# Patient Record
Sex: Female | Born: 1950 | Race: White | Hispanic: No | Marital: Single | Smoking: Never smoker
Health system: Southern US, Community
[De-identification: ages and names within clinical notes are randomized; demographics above are authoritative.]

## PROBLEM LIST (undated history)

## (undated) DIAGNOSIS — E785 Hyperlipidemia, unspecified: Secondary | ICD-10-CM

## (undated) DIAGNOSIS — M545 Low back pain, unspecified: Secondary | ICD-10-CM

## (undated) DIAGNOSIS — M1990 Unspecified osteoarthritis, unspecified site: Secondary | ICD-10-CM

## (undated) DIAGNOSIS — F32A Depression, unspecified: Secondary | ICD-10-CM

## (undated) DIAGNOSIS — F329 Major depressive disorder, single episode, unspecified: Secondary | ICD-10-CM

## (undated) DIAGNOSIS — F0280 Dementia in other diseases classified elsewhere without behavioral disturbance: Secondary | ICD-10-CM

## (undated) DIAGNOSIS — E538 Deficiency of other specified B group vitamins: Secondary | ICD-10-CM

## (undated) HISTORY — DX: Major depressive disorder, single episode, unspecified: F32.9

## (undated) HISTORY — DX: Hyperlipidemia, unspecified: E78.5

## (undated) HISTORY — DX: Depression, unspecified: F32.A

## (undated) HISTORY — DX: Unspecified osteoarthritis, unspecified site: M19.90

## (undated) HISTORY — DX: Deficiency of other specified B group vitamins: E53.8

## (undated) HISTORY — DX: Low back pain, unspecified: M54.50

## (undated) HISTORY — DX: Low back pain: M54.5

---

## 1988-10-24 HISTORY — PX: CHOLECYSTECTOMY: SHX55

## 1999-03-03 ENCOUNTER — Other Ambulatory Visit: Admission: RE | Admit: 1999-03-03 | Discharge: 1999-03-03 | Payer: Self-pay | Admitting: Family Medicine

## 2000-03-14 ENCOUNTER — Other Ambulatory Visit: Admission: RE | Admit: 2000-03-14 | Discharge: 2000-03-14 | Payer: Self-pay | Admitting: Family Medicine

## 2001-05-04 ENCOUNTER — Other Ambulatory Visit: Admission: RE | Admit: 2001-05-04 | Discharge: 2001-05-04 | Payer: Self-pay | Admitting: Internal Medicine

## 2002-04-24 ENCOUNTER — Other Ambulatory Visit: Admission: RE | Admit: 2002-04-24 | Discharge: 2002-04-24 | Payer: Self-pay | Admitting: Internal Medicine

## 2003-12-22 ENCOUNTER — Other Ambulatory Visit: Admission: RE | Admit: 2003-12-22 | Discharge: 2003-12-22 | Payer: Self-pay | Admitting: Internal Medicine

## 2004-01-13 ENCOUNTER — Ambulatory Visit (HOSPITAL_COMMUNITY): Admission: RE | Admit: 2004-01-13 | Discharge: 2004-01-13 | Payer: Self-pay | Admitting: Internal Medicine

## 2005-03-29 ENCOUNTER — Ambulatory Visit: Payer: Self-pay | Admitting: Internal Medicine

## 2005-04-06 ENCOUNTER — Ambulatory Visit: Payer: Self-pay | Admitting: Internal Medicine

## 2005-04-06 ENCOUNTER — Other Ambulatory Visit: Admission: RE | Admit: 2005-04-06 | Discharge: 2005-04-06 | Payer: Self-pay | Admitting: Internal Medicine

## 2005-04-09 ENCOUNTER — Ambulatory Visit: Payer: Self-pay | Admitting: Internal Medicine

## 2005-05-05 ENCOUNTER — Ambulatory Visit: Payer: Self-pay | Admitting: Gastroenterology

## 2005-05-19 ENCOUNTER — Ambulatory Visit: Payer: Self-pay | Admitting: Gastroenterology

## 2005-06-07 ENCOUNTER — Ambulatory Visit: Payer: Self-pay | Admitting: Internal Medicine

## 2005-09-27 ENCOUNTER — Ambulatory Visit: Payer: Self-pay | Admitting: Internal Medicine

## 2005-10-07 ENCOUNTER — Ambulatory Visit: Payer: Self-pay | Admitting: Endocrinology

## 2006-09-20 ENCOUNTER — Ambulatory Visit: Payer: Self-pay | Admitting: Internal Medicine

## 2006-09-20 LAB — CONVERTED CEMR LAB
ALT: 15 units/L (ref 0–40)
AST: 17 units/L (ref 0–37)
Albumin: 3.4 g/dL — ABNORMAL LOW (ref 3.5–5.2)
Basophils Relative: 0.6 % (ref 0.0–1.0)
Crystals: NEGATIVE
Eosinophil percent: 1.1 % (ref 0.0–5.0)
GFR calc non Af Amer: 55 mL/min
Glomerular Filtration Rate, Af Am: 66 mL/min/{1.73_m2}
HCT: 40.1 % (ref 36.0–46.0)
MCHC: 34.2 g/dL (ref 30.0–36.0)
Neutro Abs: 5.1 10*3/uL (ref 1.4–7.7)
RBC: 4.19 M/uL (ref 3.87–5.11)
RDW: 12.1 % (ref 11.5–14.6)
Sodium: 137 meq/L (ref 135–145)
Specific Gravity, Urine: 1.02 (ref 1.000–1.03)
Total Bilirubin: 0.6 mg/dL (ref 0.3–1.2)
Triglyceride fasting, serum: 72 mg/dL (ref 0–149)
WBC: 7.7 10*3/uL (ref 4.5–10.5)

## 2006-09-26 ENCOUNTER — Encounter: Payer: Self-pay | Admitting: Internal Medicine

## 2006-09-26 ENCOUNTER — Ambulatory Visit: Payer: Self-pay | Admitting: Internal Medicine

## 2006-09-26 ENCOUNTER — Other Ambulatory Visit: Admission: RE | Admit: 2006-09-26 | Discharge: 2006-09-26 | Payer: Self-pay | Admitting: Internal Medicine

## 2007-07-14 ENCOUNTER — Encounter: Payer: Self-pay | Admitting: Internal Medicine

## 2007-07-14 DIAGNOSIS — F329 Major depressive disorder, single episode, unspecified: Secondary | ICD-10-CM | POA: Insufficient documentation

## 2007-07-14 DIAGNOSIS — M1990 Unspecified osteoarthritis, unspecified site: Secondary | ICD-10-CM | POA: Insufficient documentation

## 2007-08-10 ENCOUNTER — Encounter: Payer: Self-pay | Admitting: Endocrinology

## 2007-10-22 ENCOUNTER — Encounter: Payer: Self-pay | Admitting: Internal Medicine

## 2007-10-26 ENCOUNTER — Telehealth: Payer: Self-pay | Admitting: Internal Medicine

## 2007-11-08 ENCOUNTER — Ambulatory Visit: Payer: Self-pay | Admitting: Internal Medicine

## 2007-11-08 LAB — CONVERTED CEMR LAB
AST: 41 units/L — ABNORMAL HIGH (ref 0–37)
Albumin: 3.6 g/dL (ref 3.5–5.2)
Basophils Absolute: 0 10*3/uL (ref 0.0–0.1)
Bilirubin, Direct: 0.1 mg/dL (ref 0.0–0.3)
Cholesterol: 206 mg/dL (ref 0–200)
Direct LDL: 142.6 mg/dL
Eosinophils Absolute: 0.1 10*3/uL (ref 0.0–0.6)
Eosinophils Relative: 1.4 % (ref 0.0–5.0)
GFR calc non Af Amer: 69 mL/min
Glucose, Bld: 95 mg/dL (ref 70–99)
HCT: 37.4 % (ref 36.0–46.0)
Hemoglobin: 13.1 g/dL (ref 12.0–15.0)
Ketones, ur: NEGATIVE mg/dL
Lymphocytes Relative: 34.1 % (ref 12.0–46.0)
MCHC: 35 g/dL (ref 30.0–36.0)
MCV: 92.6 fL (ref 78.0–100.0)
Monocytes Absolute: 0.6 10*3/uL (ref 0.2–0.7)
Neutro Abs: 4.2 10*3/uL (ref 1.4–7.7)
Neutrophils Relative %: 56.1 % (ref 43.0–77.0)
Potassium: 4.1 meq/L (ref 3.5–5.1)
Sodium: 140 meq/L (ref 135–145)
TSH: 0.68 microintl units/mL (ref 0.35–5.50)
Total Bilirubin: 0.6 mg/dL (ref 0.3–1.2)
Total CHOL/HDL Ratio: 4.1
Urobilinogen, UA: 0.2 (ref 0.0–1.0)

## 2007-11-13 ENCOUNTER — Ambulatory Visit: Payer: Self-pay | Admitting: Internal Medicine

## 2007-11-13 DIAGNOSIS — E538 Deficiency of other specified B group vitamins: Secondary | ICD-10-CM | POA: Insufficient documentation

## 2007-11-13 DIAGNOSIS — M545 Low back pain: Secondary | ICD-10-CM

## 2007-11-13 DIAGNOSIS — R945 Abnormal results of liver function studies: Secondary | ICD-10-CM | POA: Insufficient documentation

## 2007-11-13 DIAGNOSIS — E785 Hyperlipidemia, unspecified: Secondary | ICD-10-CM

## 2007-11-13 HISTORY — DX: Abnormal results of liver function studies: R94.5

## 2007-11-19 ENCOUNTER — Telehealth: Payer: Self-pay | Admitting: Internal Medicine

## 2007-11-22 ENCOUNTER — Encounter: Payer: Self-pay | Admitting: Internal Medicine

## 2007-11-22 ENCOUNTER — Encounter: Admission: RE | Admit: 2007-11-22 | Discharge: 2007-12-19 | Payer: Self-pay | Admitting: Internal Medicine

## 2007-11-29 ENCOUNTER — Encounter: Admission: RE | Admit: 2007-11-29 | Discharge: 2007-11-29 | Payer: Self-pay | Admitting: Internal Medicine

## 2008-02-07 ENCOUNTER — Ambulatory Visit: Payer: Self-pay | Admitting: Internal Medicine

## 2008-02-07 LAB — CONVERTED CEMR LAB
Albumin: 3.6 g/dL (ref 3.5–5.2)
Alkaline Phosphatase: 67 units/L (ref 39–117)
BUN: 12 mg/dL (ref 6–23)
Calcium: 9.5 mg/dL (ref 8.4–10.5)
GFR calc Af Amer: 74 mL/min
GFR calc non Af Amer: 61 mL/min
Glucose, Bld: 90 mg/dL (ref 70–99)
Potassium: 4.9 meq/L (ref 3.5–5.1)
Sodium: 142 meq/L (ref 135–145)
Total Protein: 6.8 g/dL (ref 6.0–8.3)

## 2008-02-13 ENCOUNTER — Ambulatory Visit: Payer: Self-pay | Admitting: Internal Medicine

## 2008-02-13 DIAGNOSIS — L255 Unspecified contact dermatitis due to plants, except food: Secondary | ICD-10-CM

## 2008-02-13 HISTORY — DX: Unspecified contact dermatitis due to plants, except food: L25.5

## 2008-03-27 ENCOUNTER — Telehealth: Payer: Self-pay | Admitting: Internal Medicine

## 2008-09-12 ENCOUNTER — Ambulatory Visit: Payer: Self-pay | Admitting: Internal Medicine

## 2008-09-12 DIAGNOSIS — R030 Elevated blood-pressure reading, without diagnosis of hypertension: Secondary | ICD-10-CM

## 2008-09-26 ENCOUNTER — Ambulatory Visit: Payer: Self-pay | Admitting: Internal Medicine

## 2008-09-26 DIAGNOSIS — I10 Essential (primary) hypertension: Secondary | ICD-10-CM

## 2008-10-21 ENCOUNTER — Encounter: Payer: Self-pay | Admitting: Internal Medicine

## 2008-11-25 ENCOUNTER — Telehealth: Payer: Self-pay | Admitting: Internal Medicine

## 2009-01-28 ENCOUNTER — Ambulatory Visit: Payer: Self-pay | Admitting: Internal Medicine

## 2009-01-28 LAB — CONVERTED CEMR LAB
AST: 23 units/L (ref 0–37)
Basophils Absolute: 0 10*3/uL (ref 0.0–0.1)
Bilirubin, Direct: 0.2 mg/dL (ref 0.0–0.3)
CO2: 30 meq/L (ref 19–32)
Chloride: 105 meq/L (ref 96–112)
Cholesterol: 206 mg/dL — ABNORMAL HIGH (ref 0–200)
Eosinophils Relative: 1.2 % (ref 0.0–5.0)
HCT: 35.3 % — ABNORMAL LOW (ref 36.0–46.0)
HDL: 52.3 mg/dL (ref >39.00)
Lymphocytes Relative: 27.8 % (ref 12.0–46.0)
Lymphs Abs: 1.8 10*3/uL (ref 0.7–4.0)
Monocytes Relative: 7.1 % (ref 3.0–12.0)
Neutrophils Relative %: 63.3 % (ref 43.0–77.0)
Platelets: 259 10*3/uL (ref 150.0–400.0)
Potassium: 4.2 meq/L (ref 3.5–5.1)
RDW: 11.9 % (ref 11.5–14.6)
Specific Gravity, Urine: <=1.005 (ref 1.000–1.030)
TSH: 0.69 microintl units/mL (ref 0.35–5.50)
Total Bilirubin: 0.7 mg/dL (ref 0.3–1.2)
Total CHOL/HDL Ratio: 4
Triglycerides: 94 mg/dL (ref 0.0–149.0)
Urine Glucose: NEGATIVE mg/dL
Urobilinogen, UA: 0.2 (ref 0.0–1.0)
VLDL: 18.8 mg/dL (ref 0.0–40.0)
WBC: 6.6 10*3/uL (ref 4.5–10.5)
pH: 6.5 (ref 5.0–8.0)

## 2009-02-02 ENCOUNTER — Ambulatory Visit: Payer: Self-pay | Admitting: Internal Medicine

## 2009-02-02 DIAGNOSIS — R109 Unspecified abdominal pain: Secondary | ICD-10-CM

## 2009-04-02 ENCOUNTER — Telehealth (INDEPENDENT_AMBULATORY_CARE_PROVIDER_SITE_OTHER): Payer: Self-pay | Admitting: *Deleted

## 2009-04-06 ENCOUNTER — Encounter: Payer: Self-pay | Admitting: Internal Medicine

## 2009-05-06 ENCOUNTER — Telehealth: Payer: Self-pay | Admitting: Internal Medicine

## 2009-10-26 ENCOUNTER — Encounter: Payer: Self-pay | Admitting: Internal Medicine

## 2010-02-10 ENCOUNTER — Ambulatory Visit: Payer: Self-pay | Admitting: Internal Medicine

## 2010-02-10 LAB — CONVERTED CEMR LAB
Albumin: 3.8 g/dL (ref 3.5–5.2)
Basophils Absolute: 0 10*3/uL (ref 0.0–0.1)
Calcium: 9.2 mg/dL (ref 8.4–10.5)
Chloride: 106 meq/L (ref 96–112)
Cholesterol: 181 mg/dL (ref 0–200)
Creatinine, Ser: 0.9 mg/dL (ref 0.4–1.2)
Eosinophils Absolute: 0.1 10*3/uL (ref 0.0–0.7)
HCT: 37.1 % (ref 36.0–46.0)
HDL: 52.4 mg/dL (ref >39.00)
Hemoglobin: 12.4 g/dL (ref 12.0–15.0)
Ketones, ur: NEGATIVE mg/dL
Leukocytes, UA: NEGATIVE
Lymphs Abs: 2 10*3/uL (ref 0.7–4.0)
MCHC: 33.4 g/dL (ref 30.0–36.0)
Monocytes Absolute: 0.5 10*3/uL (ref 0.1–1.0)
Neutro Abs: 3.3 10*3/uL (ref 1.4–7.7)
RDW: 12.8 % (ref 11.5–14.6)
Sodium: 144 meq/L (ref 135–145)
Specific Gravity, Urine: 1.01 (ref 1.000–1.030)
TSH: 0.69 microintl units/mL (ref 0.35–5.50)
Triglycerides: 91 mg/dL (ref 0.0–149.0)
Urobilinogen, UA: 0.2 (ref 0.0–1.0)

## 2010-02-17 ENCOUNTER — Other Ambulatory Visit: Admission: RE | Admit: 2010-02-17 | Discharge: 2010-02-17 | Payer: Self-pay | Admitting: Internal Medicine

## 2010-02-17 ENCOUNTER — Ambulatory Visit: Payer: Self-pay | Admitting: Internal Medicine

## 2010-02-17 LAB — HM PAP SMEAR

## 2010-04-05 ENCOUNTER — Encounter (INDEPENDENT_AMBULATORY_CARE_PROVIDER_SITE_OTHER): Payer: Self-pay | Admitting: *Deleted

## 2010-08-30 ENCOUNTER — Telehealth: Payer: Self-pay | Admitting: Internal Medicine

## 2010-09-03 ENCOUNTER — Ambulatory Visit: Payer: Self-pay | Admitting: Internal Medicine

## 2010-09-03 DIAGNOSIS — R21 Rash and other nonspecific skin eruption: Secondary | ICD-10-CM | POA: Insufficient documentation

## 2010-09-03 LAB — CONVERTED CEMR LAB
CO2: 30 meq/L (ref 19–32)
Calcium: 9.6 mg/dL (ref 8.4–10.5)
Creatinine, Ser: 0.9 mg/dL (ref 0.4–1.2)
Vitamin B-12: 673 pg/mL (ref 211–911)

## 2010-11-11 ENCOUNTER — Encounter: Payer: Self-pay | Admitting: Internal Medicine

## 2010-11-23 NOTE — Assessment & Plan Note (Signed)
Summary: 6 month follow up-lb   Vital Signs:  Patient profile:   60 year old female Height:      63 inches Weight:      169 pounds BMI:     30.05 Temp:     97.8 degrees F oral Pulse rate:   76 / minute Pulse rhythm:   regular Resp:     16 per minute BP sitting:   120 / 68  (left arm) Cuff size:   regular  Vitals Entered By: Lanier Prude, CMA(AAMA) (September 03, 2010 8:09 AM) CC: 6 mo f/u  c/o knot on Rt ankle Is Patient Diabetic? No   CC:  6 mo f/u  c/o knot on Rt ankle.  History of Present Illness: The patient presents for a follow up of hypertension, depression, OA, B 12 def  Current Medications (verified): 1)  Celexa 20 Mg  Tabs (Citalopram Hydrobromide) .... Once Daily 2)  Caltrate 600+d 600-400 Mg-Unit  Tabs (Calcium Carbonate-Vitamin D) .... Once Daily 3)  Lasix 20 Mg  Tabs (Furosemide) .Marland Kitchen.. 1 Once Daily Prn 4)  Klor-Con M10 10 Meq  Tbcr (Potassium Chloride Crys Cr) .Marland Kitchen.. 1 Po As Needed 5)  Cobal-1000 1000 Mcg/ml Inj Soln (Cyanocobalamin) .... Q 2 Wks 6)  Adult Aspirin Low Strength 81 Mg  Tbdp (Aspirin) .... Once Daily 7)  Fish Oil   Oil (Fish Oil) .... Once Daily 8)  Meloxicam 15 Mg Tabs (Meloxicam) .... Once Daily 9)  Zantac Otc .... By Mouth Prn  Allergies (verified): 1)  ! * Mycins  Past History:  Past Medical History: Last updated: 02/02/2009 Depression Osteoarthritis VITAMIN B12 DEFICENCY FAMILY HX:   DM--MOTHER                       CAD-- FATHER Low back pain Hyperlipidemia, mild GYN Dr Cherly Hensen  Social History: Last updated: 09/12/2008 Occupation: [employer] Single Never Smoked Lookking after her parents Alcohol use-no  Review of Systems  The patient denies fever, chest pain, dyspnea on exertion, and melena.    Physical Exam  General:  alert, well-developed, well-nourished, well-hydrated, appropriate dress, normal appearance, and overweight-appearing.   Head:  Normocephalic and atraumatic without obvious abnormalities. No  apparent alopecia or balding. Mouth:  Oral mucosa and oropharynx without lesions or exudates.  Teeth in good repair. Neck:  No deformities, masses, or tenderness noted. Lungs:  Normal respiratory effort, chest expands symmetrically. Lungs are clear to auscultation, no crackles or wheezes. Heart:  Normal rate and regular rhythm. S1 and S2 normal without gallop, murmur, click, rub or other extra sounds. Abdomen:  Bowel sounds positive,abdomen soft and non-tender without masses, organomegaly or hernias noted. Msk:  normal ROM.   Extremities:  No clubbing, cyanosis, edema, or deformity noted with normal full range of motion of all joints.   Neurologic:  No cranial nerve deficits noted. Station and gait are normal. Plantar reflexes are down-going bilaterally. DTRs are symmetrical throughout. Sensory, motor and coordinative functions appear intact. Skin:  R ankle 5 mm subq nodule NT Ringworm like rash on L hand Psych:  Cognition and judgment appear intact. Alert and cooperative with normal attention span and concentration. No apparent delusions, illusions, hallucinations   Impression & Recommendations:  Problem # 1:  ESSENTIAL HYPERTENSION (ICD-401.9) Assessment Unchanged  Her updated medication list for this problem includes:    Lasix 20 Mg Tabs (Furosemide) .Marland Kitchen... 1 once daily prn  Orders: TLB-B12, Serum-Total ONLY (52841-L24) TLB-BMP (Basic Metabolic Panel-BMET) (80048-METABOL) TLB-TSH (  Thyroid Stimulating Hormone) (84443-TSH)  Problem # 2:  B12 DEFICIENCY (ICD-266.2) Assessment: Unchanged  On the regimen of medicine(s) reflected in the chart    Orders: TLB-B12, Serum-Total ONLY ([address]-U04) TLB-BMP (Basic Metabolic Panel-BMET) (80048-METABOL) TLB-TSH (Thyroid Stimulating Hormone) (84443-TSH)  Problem # 3:  DEPRESSION (ICD-311) Assessment: Unchanged  Her updated medication list for this problem includes:    Celexa 20 Mg Tabs (Citalopram hydrobromide) ..... Once daily  Problem #  4:  SKIN RASH (ICD-782.1) L hand Assessment: Deteriorated  S/p Derm eval. Will reschedule w/another Derm per pt's request  Orders: Dermatology Referral (Derma) TLB-B12, Serum-Total ONLY (54098-J19) TLB-BMP (Basic Metabolic Panel-BMET) (80048-METABOL) TLB-TSH (Thyroid Stimulating Hormone) (84443-TSH)  Problem # 5:  HYPERLIPIDEMIA (ICD-272.4) Assessment: Unchanged  Complete Medication List: 1)  Celexa 20 Mg Tabs (Citalopram hydrobromide) .... Once daily 2)  Caltrate 600+d 600-400 Mg-unit Tabs (Calcium carbonate-vitamin d) .... Once daily 3)  Lasix 20 Mg Tabs (Furosemide) .Marland Kitchen.. 1 once daily prn 4)  Klor-con M10 10 Meq Tbcr (Potassium chloride crys cr) .Marland Kitchen.. 1 po as needed 5)  Cobal-1000 1000 Mcg/ml Inj Soln (Cyanocobalamin) .... Q 2 wks 6)  Adult Aspirin Low Strength 81 Mg Tbdp (Aspirin) .... Once daily 7)  Fish Oil Oil (Fish oil) .... Once daily 8)  Meloxicam 15 Mg Tabs (Meloxicam) .... Once daily 9)  Zantac Otc  .... By mouth prn  Patient Instructions: 1)  Please schedule a follow-up appointment in 6 months well w/labs.   Orders Added: 1)  Dermatology Referral [Derma] 2)  TLB-B12, Serum-Total ONLY [82607-B12] 3)  TLB-BMP (Basic Metabolic Panel-BMET) [80048-METABOL] 4)  TLB-TSH (Thyroid Stimulating Hormone) [84443-TSH] 5)  Est. Patient Level IV [14782]   Immunization History:  Influenza Immunization History:    Influenza:  historical (08/10/2010)   Immunization History:  Influenza Immunization History:    Influenza:  Historical (08/10/2010)

## 2010-11-23 NOTE — Progress Notes (Signed)
Summary: patient wanted to know if she had labs scheduled.  Phone Note Call from Patient Call back at Home Phone [phone] Call back at Work Phone [phone]   Caller: Patient Call For: Dr Posey Rea Summary of Call: Pt requesting labs before scheduling 6 mos f/u. No labs ordered. Please advise. Initial call taken by: Verdell Face,  August 30, 2010 8:12 AM  Follow-up for Phone Call        I explained to patient and she is aware no labs were ordered by MD. Follow-up by: Daphane Shepherd,  August 30, 2010 8:22 AM

## 2010-11-23 NOTE — Letter (Signed)
Summary: Colonoscopy Date Change Letter  [facility]  [address] [place], [place] [address]   Phone: [phone]  Fax: [phone]      April 05, 2010 MRN: [id number]   [facility] [address] [place], [place]  [address]   Dear [name],   Previously you were recommended to have a repeat colonoscopy around this time. Your chart was recently reviewed by Dr. Lina Sar of [facility]. Follow up colonoscopy is now recommended in July 2013. This revised recommendation is based on current, nationally recognized guidelines for colorectal cancer screening and polyp surveillance. These guidelines are endorsed by the American Cancer Society, [employer] on Colorectal Cancer as well as numerous other major medical organizations.  Please understand that our recommendation assumes that you do not have any new symptoms such as bleeding, a change in bowel habits, anemia, or significant abdominal discomfort. If you do have any concerning GI symptoms or want to discuss the guideline recommendations, please call to arrange an office visit at your earliest convenience. Otherwise we will keep you in our reminder system and contact you 1-2 months prior to the date listed above to schedule your next colonoscopy.  Thank you,  [name]. Juanda Chance, M.D.  [facility] [phone]

## 2010-11-23 NOTE — Assessment & Plan Note (Signed)
Summary: CPX/BCBS/#/CD   Vital Signs:  Patient profile:   60 year old female Height:      63 inches Weight:      173.38 pounds BMI:     30.82 O2 Sat:      99 % on Room air Temp:     97.5 degrees F oral Pulse rate:   84 / minute BP sitting:   132 / 76  (left arm) Cuff size:   regular  Vitals Entered By: Lucious Groves (February 17, 2010 10:33 AM)  O2 Flow:  Room air CC: CPX./kb Is Patient Diabetic? No Pain Assessment Patient in pain? no        CC:  CPX./kb.  History of Present Illness: The patient presents for a wellness examination   Current Medications (verified): 1)  Celexa 20 Mg  Tabs (Citalopram Hydrobromide) .... Once Daily 2)  Caltrate 600+d 600-400 Mg-Unit  Tabs (Calcium Carbonate-Vitamin D) .... Once Daily 3)  Lasix 20 Mg  Tabs (Furosemide) .Marland Kitchen.. 1 Once Daily Prn 4)  Klor-Con M10 10 Meq  Tbcr (Potassium Chloride Crys Cr) .Marland Kitchen.. 1 Po As Needed 5)  Cobal-1000 1000 Mcg/ml Inj Soln (Cyanocobalamin) .... Q 2 Wks 6)  Adult Aspirin Low Strength 81 Mg  Tbdp (Aspirin) .... Once Daily 7)  Fish Oil   Oil (Fish Oil) .... Once Daily 8)  Meloxicam 15 Mg Tabs (Meloxicam) .... Once Daily 9)  Zantac Otc .... By Mouth Prn  Allergies (verified): 1)  ! * Mycins  Past History:  Past Medical History: Last updated: 02/02/2009 Depression Osteoarthritis VITAMIN B12 DEFICENCY FAMILY HX:   DM--MOTHER                       CAD-- FATHER Low back pain Hyperlipidemia, mild GYN Dr Cherly Hensen  Social History: Last updated: 09/12/2008 Occupation: [employer] Single Never Smoked Lookking after her parents Alcohol use-no  Review of Systems       The patient complains of suspicious skin lesions.  The patient denies anorexia, fever, weight loss, weight gain, vision loss, decreased hearing, hoarseness, chest pain, syncope, dyspnea on exertion, peripheral edema, prolonged cough, headaches, hemoptysis, abdominal pain, melena, hematochezia, severe indigestion/heartburn, hematuria,  incontinence, genital sores, muscle weakness, transient blindness, difficulty walking, depression, unusual weight change, abnormal bleeding, enlarged lymph nodes, angioedema, and breast masses.    Physical Exam  General:  alert, well-developed, well-nourished, well-hydrated, appropriate dress, normal appearance, and overweight-appearing.   Head:  Normocephalic and atraumatic without obvious abnormalities. No apparent alopecia or balding. Eyes:  No corneal or conjunctival inflammation noted. EOMI. Perrla. Ears:  External ear exam shows no significant lesions or deformities.  Otoscopic examination reveals clear canals, tympanic membranes are intact bilaterally without bulging, retraction, inflammation or discharge. Hearing is grossly normal bilaterally. Nose:  External nasal examination shows no deformity or inflammation. Nasal mucosa are pink and moist without lesions or exudates. Mouth:  Oral mucosa and oropharynx without lesions or exudates.  Teeth in good repair. Neck:  No deformities, masses, or tenderness noted. Chest Wall:  No deformities, masses, LUQ rib tenderness noted. Breasts:  No mass, nodules, thickening, tenderness, bulging, retraction, inflamation, nipple discharge or skin changes noted.   Lungs:  Normal respiratory effort, chest expands symmetrically. Lungs are clear to auscultation, no crackles or wheezes. Heart:  Normal rate and regular rhythm. S1 and S2 normal without gallop, murmur, click, rub or other extra sounds. Abdomen:  Bowel sounds positive,abdomen soft and non-tender without masses, organomegaly or hernias noted. Rectal:  no external abnormalities, normal sphincter tone, and no masses.  G(-) stool Genitalia:  normal introitus, no external lesions, no vaginal discharge, no friaility or hemorrhage, no adnexal masses or tenderness, and vaginal atrophy.   Msk:  normal ROM.   Pulses:  R and L carotid,radial,femoral,dorsalis pedis and posterior tibial pulses are full and equal  bilaterally Extremities:  No clubbing, cyanosis, edema, or deformity noted with normal full range of motion of all joints.   Neurologic:  No cranial nerve deficits noted. Station and gait are normal. Plantar reflexes are down-going bilaterally. DTRs are symmetrical throughout. Sensory, motor and coordinative functions appear intact. Skin:  Intact without suspicious lesions or rashes SKs on back Cervical Nodes:  No lymphadenopathy noted Inguinal Nodes:  No significant adenopathy Psych:  Cognition and judgment appear intact. Alert and cooperative with normal attention span and concentration. No apparent delusions, illusions, hallucinations   Impression & Recommendations:  Problem # 1:  PHYSICAL EXAMINATION (ICD-V70.0) Assessment New Colonoscopy  this fall is pending  Orders: EKG w/ Interpretation (93000) OK Pap Smear, Thin Prep ( Collection of) (Z6109)  Problem # 2:  LUQ pain - costochondritis Assessment: Improved  Problem # 3:  B12 DEFICIENCY (ICD-266.2) Assessment: Unchanged On prescription/OTC  therapy   Problem # 4:  DEPRESSION (ICD-311) Assessment: Improved  Her updated medication list for this problem includes:    Celexa 20 Mg Tabs (Citalopram hydrobromide) ..... Once daily  Complete Medication List: 1)  Celexa 20 Mg Tabs (Citalopram hydrobromide) .... Once daily 2)  Caltrate 600+d 600-400 Mg-unit Tabs (Calcium carbonate-vitamin d) .... Once daily 3)  Lasix 20 Mg Tabs (Furosemide) .Marland Kitchen.. 1 once daily prn 4)  Klor-con M10 10 Meq Tbcr (Potassium chloride crys cr) .Marland Kitchen.. 1 po as needed 5)  Cobal-1000 1000 Mcg/ml Inj Soln (Cyanocobalamin) .... Q 2 wks 6)  Adult Aspirin Low Strength 81 Mg Tbdp (Aspirin) .... Once daily 7)  Fish Oil Oil (Fish oil) .... Once daily 8)  Meloxicam 15 Mg Tabs (Meloxicam) .... Once daily 9)  Zantac Otc  .... By mouth prn  Other Orders: Tdap => 60yrs IM (60454) Admin 1st Vaccine (09811) Admin 1st Vaccine [facility]) [phone])   Patient Instructions: 1)   Please schedule a follow-up appointment in 6 months. Prescriptions: CELEXA 20 MG  TABS (CITALOPRAM HYDROBROMIDE) once daily  #30 x 6   Entered and Authorized by:   Tresa Garter MD   Signed by:   Tresa Garter MD on 02/17/2010   Method used:   Electronically to        [facility]  [address] [id number] (retail)       [address]       [place], [place]  [address]       Ph: [phone] or [phone]       Fax: [phone]   RxID:   [id number] LASIX 20 MG  TABS (FUROSEMIDE) 1 once daily prn  #90 x 3   Entered and Authorized by:   Tresa Garter MD   Signed by:   Tresa Garter MD on 02/17/2010   Method used:   Electronically to        [facility]  [address] [id number] ([facility]       [address]       [place], [place]  [address]       Ph: [phone] or [phone]       Fax: [phone]  RxID:   [id number] KLOR-CON M10 10 MEQ  TBCR (POTASSIUM CHLORIDE CRYS CR) 1 po as needed  #90 x 3   Entered and Authorized by:   Tresa Garter MD   Signed by:   Tresa Garter MD on 02/17/2010   Method used:   Electronically to        [facility]  [address] [id number]       [address]       [place], [place]  [address]       Ph: [phone] or [phone]       Fax: [phone]   RxID:   [id number] MELOXICAM 15 MG TABS (MELOXICAM) once daily  #30 Tablet x 6   Entered and Authorized by:   Tresa Garter MD   Signed by:   Tresa Garter MD on 02/17/2010   Method used:   [facility]ronically to        [facility]  [address] [id number]       [address]       [place], [place]  [address]       Ph: [phone] or [phone]       Fax: [phone]   RxID:   [id number]    Tetanus/Td Vaccine    Vaccine Type: Tdap    Site: right deltoid    Mfr: GlaxoSmithKline    Dose: 0.5 ml    Route: IM    Given by: Lucious Groves    Exp. Date: 01/16/2012    Lot #: [id number]     VIS given: 09/11/07 version given February 17, 2010.

## 2010-11-29 ENCOUNTER — Ambulatory Visit: Payer: Self-pay | Admitting: Internal Medicine

## 2010-12-24 ENCOUNTER — Encounter: Payer: Self-pay | Admitting: Internal Medicine

## 2010-12-30 NOTE — Miscellaneous (Signed)
Summary: mammogram 2012   Clinical Lists Changes  Observations: Added new observation of MAMMOGRAM: normal (11/11/2010 16:37)      Preventive Care Screening  Mammogram:    Date:  11/11/2010    Results:  normal

## 2011-01-17 ENCOUNTER — Telehealth: Payer: Self-pay | Admitting: Internal Medicine

## 2011-01-17 NOTE — Telephone Encounter (Signed)
Use over-the-counter  "cold" medicines  such as "Tylenol cold" , "Advil cold",  "Mucinex" or" Mucinex D"  for cough and congestion.   Avoid decongestants if you have high blood pressure and use "Afrin" nasal spray for nasal congestion as directed instead. Use" Delsym" or" Robitussin" cough syrup varietis for cough.  You can use plain "Tylenol" or "Advil" for fever, chills and achyness.  

## 2011-01-17 NOTE — Telephone Encounter (Signed)
Pt states that she has a cough "w/stuffiness" x3 days w/o fever or mucus; no sinus pain and/or pressure and "feels like a pulled muscle in her back maybe" which has been a dull pain x2 weeks that she is noticing more prominent w/cough. Pt has a CPE appt for 02/18/2011 and would like to know if she could get a Rx called in or suggestion for something to take OTC for cough w/"stuffiness" and/or back pain.? Please advise.

## 2011-01-18 NOTE — Telephone Encounter (Signed)
LMOM w/information to inform Pt.

## 2011-02-11 ENCOUNTER — Telehealth: Payer: Self-pay | Admitting: *Deleted

## 2011-02-11 DIAGNOSIS — R5383 Other fatigue: Secondary | ICD-10-CM

## 2011-02-11 DIAGNOSIS — E538 Deficiency of other specified B group vitamins: Secondary | ICD-10-CM

## 2011-02-11 DIAGNOSIS — E785 Hyperlipidemia, unspecified: Secondary | ICD-10-CM

## 2011-02-11 DIAGNOSIS — R945 Abnormal results of liver function studies: Secondary | ICD-10-CM

## 2011-02-11 DIAGNOSIS — R7309 Other abnormal glucose: Secondary | ICD-10-CM

## 2011-02-11 DIAGNOSIS — I10 Essential (primary) hypertension: Secondary | ICD-10-CM

## 2011-02-11 NOTE — Telephone Encounter (Signed)
Patient requesting labs prior to next ov - 02/28/11. Please advise.

## 2011-02-12 NOTE — Telephone Encounter (Signed)
OK lipids, CMET, TSH, UA, CBC  790.29 Vit B12 266.20 Thx

## 2011-02-14 NOTE — Telephone Encounter (Signed)
Left vm for pt that labs were ordered and to be fasting.

## 2011-02-23 ENCOUNTER — Other Ambulatory Visit (INDEPENDENT_AMBULATORY_CARE_PROVIDER_SITE_OTHER): Payer: PRIVATE HEALTH INSURANCE

## 2011-02-23 DIAGNOSIS — E538 Deficiency of other specified B group vitamins: Secondary | ICD-10-CM

## 2011-02-23 DIAGNOSIS — R945 Abnormal results of liver function studies: Secondary | ICD-10-CM

## 2011-02-23 DIAGNOSIS — R5381 Other malaise: Secondary | ICD-10-CM

## 2011-02-23 DIAGNOSIS — R5383 Other fatigue: Secondary | ICD-10-CM

## 2011-02-23 DIAGNOSIS — E785 Hyperlipidemia, unspecified: Secondary | ICD-10-CM

## 2011-02-23 DIAGNOSIS — I10 Essential (primary) hypertension: Secondary | ICD-10-CM

## 2011-02-23 DIAGNOSIS — R7309 Other abnormal glucose: Secondary | ICD-10-CM

## 2011-02-23 LAB — LIPID PANEL
Cholesterol: 192 mg/dL (ref 0–200)
Total CHOL/HDL Ratio: 4
Triglycerides: 72 mg/dL (ref 0.0–149.0)

## 2011-02-23 LAB — URINALYSIS, ROUTINE W REFLEX MICROSCOPIC
Ketones, ur: NEGATIVE
Specific Gravity, Urine: 1.01 (ref 1.000–1.030)
Urine Glucose: NEGATIVE
pH: 6 (ref 5.0–8.0)

## 2011-02-23 LAB — CBC WITH DIFFERENTIAL/PLATELET
Basophils Absolute: 0 10*3/uL (ref 0.0–0.1)
Eosinophils Absolute: 0.1 10*3/uL (ref 0.0–0.7)
Eosinophils Relative: 1.3 % (ref 0.0–5.0)
MCHC: 33.4 g/dL (ref 30.0–36.0)
MCV: 93.5 fl (ref 78.0–100.0)
Monocytes Absolute: 0.5 10*3/uL (ref 0.1–1.0)
Neutrophils Relative %: 60.3 % (ref 43.0–77.0)
Platelets: 269 10*3/uL (ref 150.0–400.0)
WBC: 6.4 10*3/uL (ref 4.5–10.5)

## 2011-02-23 LAB — COMPREHENSIVE METABOLIC PANEL
ALT: 12 U/L (ref 0–35)
AST: 19 U/L (ref 0–37)
Albumin: 3.8 g/dL (ref 3.5–5.2)
Alkaline Phosphatase: 65 U/L (ref 39–117)
Potassium: 4.7 mEq/L (ref 3.5–5.1)
Sodium: 138 mEq/L (ref 135–145)
Total Protein: 7.1 g/dL (ref 6.0–8.3)

## 2011-02-28 ENCOUNTER — Encounter: Payer: Self-pay | Admitting: Internal Medicine

## 2011-02-28 ENCOUNTER — Ambulatory Visit (INDEPENDENT_AMBULATORY_CARE_PROVIDER_SITE_OTHER): Payer: BC Managed Care – PPO | Admitting: Internal Medicine

## 2011-02-28 VITALS — BP 126/70 | HR 80 | Temp 98.6°F | Resp 16 | Ht 63.0 in | Wt 162.0 lb

## 2011-02-28 DIAGNOSIS — Z136 Encounter for screening for cardiovascular disorders: Secondary | ICD-10-CM

## 2011-02-28 DIAGNOSIS — K1120 Sialoadenitis, unspecified: Secondary | ICD-10-CM

## 2011-02-28 DIAGNOSIS — Z0000 Encounter for general adult medical examination without abnormal findings: Secondary | ICD-10-CM

## 2011-02-28 DIAGNOSIS — K1121 Acute sialoadenitis: Secondary | ICD-10-CM

## 2011-02-28 DIAGNOSIS — F4321 Adjustment disorder with depressed mood: Secondary | ICD-10-CM

## 2011-02-28 HISTORY — DX: Acute sialoadenitis: K11.21

## 2011-02-28 MED ORDER — CEPHALEXIN 500 MG PO CAPS: 500.0000 mg | ORAL_CAPSULE | Freq: Four times a day (QID) | ORAL | Status: AC

## 2011-02-28 MED ORDER — ALPRAZOLAM 0.25 MG PO TABS: 0.2500 mg | ORAL_TABLET | Freq: Two times a day (BID) | ORAL | Status: DC

## 2011-02-28 NOTE — Assessment & Plan Note (Signed)
We discussed age appropriate health related issues, including available/recomended screening tests and vaccinations. We discussed a need for adhering to healthy diet and exercise. Labs/EKG were reviewed/ordered. EKG changes are c/w artifact. No CV sx's.  All questions were answered.

## 2011-02-28 NOTE — Progress Notes (Signed)
  Subjective:    Patient ID: [name], female    DOB: Apr 15, 1951, 60 y.o.   MRN: [id number]  HPI  The patient is here for a wellness exam. The patient has been doing well overall . She has been grieving   She has lost wt.  C/o swelling in glands   Review of Systems  Constitutional: Positive for unexpected weight change. Negative for fever, chills, diaphoresis, activity change, appetite change and fatigue.  HENT: Negative for hearing loss, ear pain, nosebleeds, congestion, sore throat, facial swelling, rhinorrhea, sneezing, mouth sores, trouble swallowing, neck pain, neck stiffness, postnasal drip, sinus pressure and tinnitus.   Eyes: Negative for pain, discharge, redness, itching and visual disturbance.  Respiratory: Negative for cough, chest tightness, shortness of breath, wheezing and stridor.   Cardiovascular: Negative for chest pain, palpitations and leg swelling.  Gastrointestinal: Negative for nausea, diarrhea, constipation, blood in stool, abdominal distention, anal bleeding and rectal pain.  Genitourinary: Negative for dysuria, urgency, frequency, hematuria, flank pain, vaginal bleeding, vaginal discharge, difficulty urinating, genital sores and pelvic pain.  Musculoskeletal: Negative for back pain, joint swelling, arthralgias and gait problem.  Skin: Negative.  Negative for rash.  Neurological: Negative for dizziness, tremors, seizures, syncope, speech difficulty, weakness, numbness and headaches.  Hematological: Negative for adenopathy. Does not bruise/bleed easily.  Psychiatric/Behavioral: Positive for behavioral problems, sleep disturbance, dysphoric mood and decreased concentration. Negative for suicidal ideas. The patient is nervous/anxious.    Wt Readings from Last 3 Encounters:  02/28/11 162 lb (73.483 kg)  09/03/10 169 lb (76.658 kg)  02/17/10 173 lb 6.1 oz (78.645 kg)       Objective:   Physical Exam  Constitutional: She appears well-developed and  well-nourished. No distress.  HENT:  Head: Normocephalic.  Right Ear: External ear normal.  Left Ear: External ear normal.  Nose: Nose normal.  Mouth/Throat: Oropharynx is clear and moist.  Eyes: Conjunctivae are normal. Pupils are equal, round, and reactive to light. Right eye exhibits no discharge. Left eye exhibits no discharge.  Neck: Normal range of motion. Neck supple. No JVD present. No tracheal deviation present. No thyromegaly present.  Cardiovascular: Normal rate, regular rhythm and normal heart sounds.   Pulmonary/Chest: No stridor. No respiratory distress. She has no wheezes.       B breasts w/fibrocystic changes  Abdominal: Soft. Bowel sounds are normal. She exhibits no distension and no mass. There is no tenderness. There is no rebound and no guarding.  Musculoskeletal: She exhibits no edema and no tenderness.  Lymphadenopathy:    She has no cervical adenopathy.  Neurological: She displays normal reflexes. No cranial nerve deficit. She exhibits normal muscle tone. Coordination normal.  Skin: No rash noted. No erythema.  Psychiatric: Her behavior is normal. Judgment and thought content normal.       Tearful and depressed          Assessment & Plan:   Acute Sialoadenitis Keflex x 10 d  Well adult exam We discussed age appropriate health related issues, including available/recomended screening tests and vaccinations. We discussed a need for adhering to healthy diet and exercise. Labs/EKG were reviewed/ordered. EKG changes are c/w artifact. No CV sx's.  All questions were answered.      Grief  Discussed - seee meds

## 2011-02-28 NOTE — Assessment & Plan Note (Signed)
Keflex x10d.

## 2011-03-11 NOTE — Assessment & Plan Note (Signed)
[facility]                           PRIMARY CARE OFFICE NOTE   NAME:[name]                     MRN:          [id number]  DATE:09/26/2006                            DOB:          January 18, 1951    The patient is a 60 year old female who presents for a wellness  examination.   Past medical history, family history, social history as per April 06, 2005 note.   Her medicines were reviewed.   Allergies reviewed.   REVIEW OF SYMPTOMS:  Irregular occasionally with cramps.  Reports left  arm tingling when turning head to the right.  The tingling will go away  when she turns head back to the left.  Occasional numbness in this spot  on the right side lateral area.  No back pain.  The rest of the 18-point  review is negative.   PHYSICAL EXAMINATION:  Blood pressure 130/74.  Pulse 79.  Temperature  99.6.  Weight is 180 pounds.  She looks well.  She is in no acute distress.  HEENT:  Moist mucosa.  NECK:  Supple.  No thyromegaly or bruits.  LUNGS:  Clear.  No wheezes or rales.  HEART:  S1 and S2.  No murmur.  No gallop.  ABDOMEN:  Soft and non-tender.  No organomegaly.  No masses.  EXTREMITIES:  Without edema.  She is alert, oriented and cooperative.  Denies being depressed.  BREASTS:  Symmetric.  Nipples without discharge.  No masses.  Denies costochondral tenderness on the left lower sternum.  GYN EXAM:  Reveals normal genitals with slightly excessive whitish  discharge.  Cervix was normal appearance.  Pap smear with slightly  excessive bleeding.  Pap smear obtained.  Bimanual examination was  normal.  RECTAL:  Without masses.  Stool was not checked guaiac due to  contamination with vaginal blood.  LABS:  EKG was normal sinus rhythm on September 20, 2006.  CBC normal.  CMET normal.  Glucose 101.  Cholesterol 186.  LDL 121.  TSH normal.  Urinalysis normal.   ASSESSMENT AND PLAN:  1. Normal wellness examination.  Health issues discussed.  Pap  smear      obtained.  She will continue to use cod liver oil.  Mammogram      pending.  Obtain chest x-ray next year.  2. Paresthesia left arm.  She needs to get a headset for her telephone      to use at work.  Call if problems.  3. Right side numbness with normal exam.  H/o B12 inj. in this area;      possible spot-like      neuropathy.  4. Depressed mood.  Continue with Celexa.  5. Occasional edema.  Lasix 20 mg and Kdur 10 mg p.r.n.     Georgina Quint. Plotnikov, MD  Electronically Signed    [employer]Med[employer]  DD: 09/27/2006  DT: 09/27/2006  Job #: [id number]

## 2011-04-17 ENCOUNTER — Other Ambulatory Visit: Payer: Self-pay | Admitting: Internal Medicine

## 2011-05-10 ENCOUNTER — Other Ambulatory Visit: Payer: Self-pay | Admitting: Internal Medicine

## 2011-05-30 ENCOUNTER — Other Ambulatory Visit: Payer: Self-pay | Admitting: Internal Medicine

## 2011-06-03 ENCOUNTER — Ambulatory Visit: Payer: BC Managed Care – PPO | Admitting: Internal Medicine

## 2011-06-13 ENCOUNTER — Other Ambulatory Visit: Payer: Self-pay | Admitting: Internal Medicine

## 2011-06-13 NOTE — Telephone Encounter (Signed)
Also, Rf req for Alprazolam 0.25mg  1 po bid # 30/ last filled 7.17.12. Ok to Rf?

## 2011-06-16 ENCOUNTER — Telehealth: Payer: Self-pay | Admitting: *Deleted

## 2011-06-16 NOTE — Telephone Encounter (Signed)
Rf req for Alprazolam 0.25mg  1 po bid # 30. Last filled 7.17.12. Med is not on active list.... Ok to rf?

## 2011-06-17 MED ORDER — ALPRAZOLAM 0.25 MG PO TABS: 0.2500 mg | ORAL_TABLET | Freq: Three times a day (TID) | ORAL | Status: DC | PRN

## 2011-06-17 NOTE — Telephone Encounter (Signed)
OK to fill this prescription with additional refills x3 Keep ROV Thank you!

## 2011-09-09 ENCOUNTER — Encounter: Payer: Self-pay | Admitting: Internal Medicine

## 2011-09-09 ENCOUNTER — Other Ambulatory Visit (INDEPENDENT_AMBULATORY_CARE_PROVIDER_SITE_OTHER): Payer: BC Managed Care – PPO

## 2011-09-09 ENCOUNTER — Ambulatory Visit (INDEPENDENT_AMBULATORY_CARE_PROVIDER_SITE_OTHER): Payer: BC Managed Care – PPO | Admitting: Internal Medicine

## 2011-09-09 DIAGNOSIS — E538 Deficiency of other specified B group vitamins: Secondary | ICD-10-CM

## 2011-09-09 DIAGNOSIS — I10 Essential (primary) hypertension: Secondary | ICD-10-CM

## 2011-09-09 DIAGNOSIS — R10812 Left upper quadrant abdominal tenderness: Secondary | ICD-10-CM

## 2011-09-09 DIAGNOSIS — K589 Irritable bowel syndrome without diarrhea: Secondary | ICD-10-CM | POA: Insufficient documentation

## 2011-09-09 LAB — CBC WITH DIFFERENTIAL/PLATELET
Basophils Relative: 0.5 % (ref 0.0–3.0)
Eosinophils Absolute: 0.2 10*3/uL (ref 0.0–0.7)
Eosinophils Relative: 2.5 % (ref 0.0–5.0)
Hemoglobin: 13.2 g/dL (ref 12.0–15.0)
Lymphocytes Relative: 29.6 % (ref 12.0–46.0)
MCHC: 33.5 g/dL (ref 30.0–36.0)
Monocytes Relative: 8.2 % (ref 3.0–12.0)
Neutro Abs: 5 10*3/uL (ref 1.4–7.7)
Neutrophils Relative %: 59.2 % (ref 43.0–77.0)
RBC: 4.21 Mil/uL (ref 3.87–5.11)
WBC: 8.4 10*3/uL (ref 4.5–10.5)

## 2011-09-09 LAB — COMPREHENSIVE METABOLIC PANEL
ALT: 16 U/L (ref 0–35)
AST: 26 U/L (ref 0–37)
Albumin: 4.1 g/dL (ref 3.5–5.2)
BUN: 18 mg/dL (ref 6–23)
CO2: 28 mEq/L (ref 19–32)
Calcium: 9.3 mg/dL (ref 8.4–10.5)
Chloride: 105 mEq/L (ref 96–112)
GFR: 77.75 mL/min (ref >60.00)
Potassium: 4.4 mEq/L (ref 3.5–5.1)

## 2011-09-09 LAB — URINALYSIS, ROUTINE W REFLEX MICROSCOPIC
Bilirubin Urine: NEGATIVE
Ketones, ur: NEGATIVE
Leukocytes, UA: NEGATIVE
Nitrite: NEGATIVE
Specific Gravity, Urine: 1.01
Total Protein, Urine: NEGATIVE
Urine Glucose: NEGATIVE
Urobilinogen, UA: 0.2
pH: 6 (ref 5.0–8.0)

## 2011-09-09 LAB — LIPASE: Lipase: 25 U/L (ref 11.0–59.0)

## 2011-09-09 LAB — VITAMIN B12: Vitamin B-12: 526 pg/mL (ref 211–911)

## 2011-09-09 LAB — FOLATE: Folate: 21.4 ng/mL

## 2011-09-09 MED ORDER — HYOSCYAMINE SULFATE ER 0.375 MG PO TB12: 0.3750 mg | ORAL_TABLET | Freq: Two times a day (BID) | ORAL | Status: DC | PRN

## 2011-09-09 NOTE — Assessment & Plan Note (Signed)
Her pain is chronic but she has had a worsening over the last 1-2 months so I will check her labs today to look for any organic pathology but I think ultimately she has IBS

## 2011-09-09 NOTE — Assessment & Plan Note (Signed)
I have asked her to try levbid as needed for symptoms relief

## 2011-09-09 NOTE — Progress Notes (Signed)
Subjective:    Patient ID: [name], female    DOB: [birth year], 60 y.o.   MRN: [id number]  Abdominal Pain This is a recurrent (she has had intermittent pain for "many" years) problem. The current episode started more than 1 year ago. The onset quality is gradual. The problem occurs intermittently. The most recent episode lasted 2 months (this episode started 2 months ago). The problem has been unchanged. The pain is located in the LUQ. The pain is at a severity of 2/10. The pain is mild. The quality of the pain is cramping. Pertinent negatives include no anorexia, arthralgias, belching, constipation, diarrhea, dysuria, fever, flatus, frequency, headaches, hematochezia, hematuria, melena, myalgias, nausea, vomiting or weight loss. Exacerbated by: emotional stress. The pain is relieved by bowel movements. She has tried nothing for the symptoms. Prior diagnostic workup includes ultrasound and lower endoscopy.  Anemia Presents for follow-up visit. Symptoms include abdominal pain. There has been no anorexia, bruising/bleeding easily, confusion, fever, leg swelling, light-headedness, malaise/fatigue, pallor, palpitations, pica or weight loss. Signs of blood loss that are not present include hematemesis, hematochezia, melena, menorrhagia and vaginal bleeding. There are no compliance problems.       Review of Systems  Constitutional: Negative for fever, chills, weight loss, malaise/fatigue, diaphoresis, activity change, appetite change, fatigue and unexpected weight change.  HENT: Negative.   Eyes: Negative.   Respiratory: Negative.   Cardiovascular: Negative.  Negative for palpitations.  Gastrointestinal: Positive for abdominal pain. Negative for nausea, vomiting, diarrhea, constipation, blood in stool, melena, hematochezia, abdominal distention, anal bleeding, rectal pain, anorexia, flatus and hematemesis.  Genitourinary: Negative for dysuria, urgency, frequency, hematuria, flank pain,  decreased urine volume, vaginal bleeding, enuresis, difficulty urinating, genital sores, menstrual problem, pelvic pain, dyspareunia and menorrhagia.  Musculoskeletal: Negative for myalgias, back pain, joint swelling, arthralgias and gait problem.  Skin: Negative for color change, pallor, rash and wound.  Neurological: Negative for dizziness, tremors, seizures, syncope, facial asymmetry, speech difficulty, weakness, light-headedness, numbness and headaches.  Hematological: Negative for adenopathy. Does not bruise/bleed easily.  Psychiatric/Behavioral: Negative.  Negative for confusion.       Objective:   Physical Exam  Vitals reviewed. Constitutional: She is oriented to person, place, and time. She appears well-developed and well-nourished. No distress.  HENT:  Head: Normocephalic and atraumatic.  Mouth/Throat: Oropharynx is clear and moist. No oropharyngeal exudate.  Eyes: Conjunctivae are normal. Right eye exhibits no discharge. Left eye exhibits no discharge. No scleral icterus.  Neck: Normal range of motion. Neck supple. No JVD present. No tracheal deviation present. No thyromegaly present.  Cardiovascular: Normal rate, regular rhythm and normal heart sounds.  Exam reveals no gallop and no friction rub.   No murmur heard. Pulmonary/Chest: Effort normal and breath sounds normal. No respiratory distress. She has no wheezes. She has no rales. She exhibits no tenderness.  Abdominal: Soft. Bowel sounds are normal. She exhibits no shifting dullness, no distension, no pulsatile liver, no fluid wave, no abdominal bruit, no ascites, no pulsatile midline mass and no mass. There is no hepatosplenomegaly. There is no tenderness. There is no rebound, no guarding and no CVA tenderness. No hernia.  Musculoskeletal: Normal range of motion. She exhibits no edema and no tenderness.  Lymphadenopathy:    She has no cervical adenopathy.  Neurological: She is oriented to person, place, and time. She displays  normal reflexes. No cranial nerve deficit. She exhibits normal muscle tone. Coordination normal.  Skin: Skin is warm and dry. No rash noted. She is not diaphoretic.  No erythema. No pallor.  Psychiatric: She has a normal mood and affect. Her behavior is normal. Judgment and thought content normal.     Lab Results  Component Value Date   WBC 6.4 02/23/2011   HGB 13.6 02/23/2011   HCT 40.7 02/23/2011   PLT 269.0 02/23/2011   GLUCOSE 80 02/23/2011   CHOL 192 02/23/2011   TRIG 72.0 02/23/2011   HDL 52.20 02/23/2011   LDLDIRECT 132.3 01/28/2009   LDLCALC 125* 02/23/2011   ALT 12 02/23/2011   AST 19 02/23/2011   NA 138 02/23/2011   K 4.7 02/23/2011   CL 101 02/23/2011   CREATININE 0.7 02/23/2011   BUN 16 02/23/2011   CO2 30 02/23/2011   TSH 0.76 02/23/2011       Assessment & Plan:

## 2011-09-09 NOTE — Patient Instructions (Signed)

## 2011-09-09 NOTE — Assessment & Plan Note (Signed)
Her BP is well controlled, I will check her lytes and renal function today 

## 2011-09-09 NOTE — Assessment & Plan Note (Signed)
I will check her CBC, B12, and folate levels today

## 2011-09-11 ENCOUNTER — Encounter: Payer: Self-pay | Admitting: Internal Medicine

## 2011-09-30 ENCOUNTER — Ambulatory Visit (INDEPENDENT_AMBULATORY_CARE_PROVIDER_SITE_OTHER): Payer: BC Managed Care – PPO | Admitting: Internal Medicine

## 2011-09-30 ENCOUNTER — Encounter: Payer: Self-pay | Admitting: Internal Medicine

## 2011-09-30 DIAGNOSIS — K589 Irritable bowel syndrome without diarrhea: Secondary | ICD-10-CM

## 2011-09-30 NOTE — Assessment & Plan Note (Signed)
Her labs were normal and she has had a good response to levbid so she will continue that as needed

## 2011-09-30 NOTE — Progress Notes (Signed)
  Subjective:    Patient ID: [name], female    DOB: May 23, 1951, 60 y.o.   MRN: [id number]  Abdominal Pain This is a recurrent problem. The current episode started more than 1 year ago. The onset quality is gradual. The problem occurs intermittently. The problem has been gradually improving. The pain is located in the LUQ. The pain is at a severity of 1/10. The pain is mild. The quality of the pain is cramping. The abdominal pain does not radiate. Pertinent negatives include no anorexia, arthralgias, belching, constipation, diarrhea, dysuria, fever, flatus, frequency, headaches, hematochezia, hematuria, melena, myalgias, nausea, vomiting or weight loss. Relieved by: levbid. Treatments tried: levbid. The treatment provided significant relief.      Review of Systems  Constitutional: Negative.  Negative for fever and weight loss.  HENT: Negative.   Eyes: Negative.   Respiratory: Negative.   Cardiovascular: Negative.   Gastrointestinal: Positive for abdominal pain. Negative for nausea, vomiting, diarrhea, constipation, blood in stool, melena, hematochezia, abdominal distention, anal bleeding, rectal pain, anorexia and flatus.  Genitourinary: Negative.  Negative for dysuria, frequency and hematuria.  Musculoskeletal: Negative.  Negative for myalgias and arthralgias.  Skin: Negative.   Neurological: Negative.  Negative for headaches.  Hematological: Negative.   Psychiatric/Behavioral: Negative.        Objective:   Physical Exam    Lab Results  Component Value Date   WBC 8.4 09/09/2011   HGB 13.2 09/09/2011   HCT 39.5 09/09/2011   PLT 285.0 09/09/2011   GLUCOSE 97 09/09/2011   CHOL 192 02/23/2011   TRIG 72.0 02/23/2011   HDL 52.20 02/23/2011   LDLDIRECT 132.3 01/28/2009   LDLCALC 125* 02/23/2011   ALT 16 09/09/2011   AST 26 09/09/2011   NA 141 09/09/2011   K 4.4 09/09/2011   CL 105 09/09/2011   CREATININE 0.8 09/09/2011   BUN 18 09/09/2011   CO2 28 09/09/2011   TSH 0.76  02/23/2011      Assessment & Plan:

## 2011-09-30 NOTE — Patient Instructions (Signed)
Irritable Bowel Syndrome Irritable Bowel Syndrome (IBS) is caused by a disturbance of normal bowel function. Other terms used are spastic colon, mucous colitis, and irritable colon. It does not require surgery, nor does it lead to cancer. There is no cure for IBS. But with proper diet, stress reduction, and medication, you will find that your problems (symptoms) will gradually disappear or improve. IBS is a common digestive disorder. It usually appears in late adolescence or early adulthood. Women develop it twice as often as men. CAUSES  After food has been digested and absorbed in the small intestine, waste material is moved into the colon (large intestine). In the colon, water and salts are absorbed from the undigested products coming from the small intestine. The remaining residue, or fecal material, is held for elimination. Under normal circumstances, gentle, rhythmic contractions on the bowel walls push the fecal material along the colon towards the rectum. In IBS, however, these contractions are irregular and poorly coordinated. The fecal material is either retained too long, resulting in constipation, or expelled too soon, producing diarrhea. SYMPTOMS  The most common symptom of IBS is pain. It is typically in the lower left side of the belly (abdomen). But it may occur anywhere in the abdomen. It can be felt as heartburn, backache, or even as a dull pain in the arms or shoulders. The pain comes from excessive bowel-muscle spasms and from the buildup of gas and fecal material in the colon. This pain:  Can range from sharp belly (abdominal) cramps to a dull, continuous ache.   Usually worsens soon after eating.   Is typically relieved by having a bowel movement or passing gas.  Abdominal pain is usually accompanied by constipation. But it may also produce diarrhea. The diarrhea typically occurs right after a meal or upon arising in the morning. The stools are typically soft and watery. They are  often flecked with secretions (mucus). Other symptoms of IBS include:  Bloating.   Loss of appetite.   Heartburn.   Feeling sick to your stomach (nausea).   Belching   Vomiting   Gas.  IBS may also cause a number of symptoms that are unrelated to the digestive system:  Fatigue.   Headaches.   Anxiety   Shortness of breath   Difficulty in concentrating.   Dizziness.  These symptoms tend to come and go. DIAGNOSIS  The symptoms of IBS closely mimic the symptoms of other, more serious digestive disorders. So your caregiver may wish to perform a variety of additional tests to exclude these disorders. He/she wants to be certain of learning what is wrong (diagnosis). The nature and purpose of each test will be explained to you. TREATMENT A number of medications are available to help correct bowel function and/or relieve bowel spasms and abdominal pain. Among the drugs available are:  Mild, non-irritating laxatives for severe constipation and to help restore normal bowel habits.   Specific anti-diarrheal medications to treat severe or prolonged diarrhea.   Anti-spasmodic agents to relieve intestinal cramps.   Your caregiver may also decide to treat you with a mild tranquilizer or sedative during unusually stressful periods in your life.  The important thing to remember is that if any drug is prescribed for you, make sure that you take it exactly as directed. Make sure that your caregiver knows how well it worked for you. HOME CARE INSTRUCTIONS   Avoid foods that are high in fat or oils. Some examples ZOX:WRUEA cream, butter, frankfurters, sausage, and other fatty  meats.   Avoid foods that have a laxative effect, such as fruit, fruit juice, and dairy products.   Cut out carbonated drinks, chewing gum, and "gassy" foods, such as beans and cabbage. This may help relieve bloating and belching.   Bran taken with plenty of liquids may help relieve constipation.   Keep track of  what foods seem to trigger your symptoms.   Avoid emotionally charged situations or circumstances that produce anxiety.   Start or continue exercising.   Get plenty of rest and sleep.  MAKE SURE YOU:   Understand these instructions.   Will watch your condition.   Will get help right away if you are not doing well or get worse.  Document Released: 10/10/2005 Document Revised: 06/22/2011 Document Reviewed: 05/30/2008 [facility] Patient Information 2012 [place], [place].

## 2012-02-02 ENCOUNTER — Encounter: Payer: Self-pay | Admitting: Internal Medicine

## 2012-03-12 ENCOUNTER — Encounter: Payer: Self-pay | Admitting: Internal Medicine

## 2012-05-14 ENCOUNTER — Telehealth: Payer: Self-pay | Admitting: *Deleted

## 2012-05-14 DIAGNOSIS — Z0000 Encounter for general adult medical examination without abnormal findings: Secondary | ICD-10-CM

## 2012-05-14 NOTE — Telephone Encounter (Signed)
05/2012 CPE labs entered.   

## 2012-05-14 NOTE — Telephone Encounter (Signed)
Message copied by Merrilyn Puma on Mon May 14, 2012 10:02 AM ------      Message from: Etheleen Sia      Created: Mon May 07, 2012  2:01 PM      Regarding: LABS       PHYSICAL LABS FOR AUG 33

## 2012-05-28 ENCOUNTER — Encounter: Payer: BC Managed Care – PPO | Admitting: Internal Medicine

## 2012-05-30 ENCOUNTER — Other Ambulatory Visit (INDEPENDENT_AMBULATORY_CARE_PROVIDER_SITE_OTHER): Payer: BC Managed Care – PPO

## 2012-05-30 DIAGNOSIS — Z0000 Encounter for general adult medical examination without abnormal findings: Secondary | ICD-10-CM

## 2012-05-30 LAB — CBC WITH DIFFERENTIAL/PLATELET
Basophils Relative: 0.6 % (ref 0.0–3.0)
Eosinophils Relative: 1.1 % (ref 0.0–5.0)
HCT: 38.8 % (ref 36.0–46.0)
Lymphs Abs: 2 10*3/uL (ref 0.7–4.0)
MCV: 93 fl (ref 78.0–100.0)
Monocytes Absolute: 0.5 10*3/uL (ref 0.1–1.0)
Platelets: 260 10*3/uL (ref 150.0–400.0)
WBC: 6.8 10*3/uL (ref 4.5–10.5)

## 2012-05-30 LAB — LIPID PANEL
Cholesterol: 177 mg/dL (ref 0–200)
LDL Cholesterol: 111 mg/dL — ABNORMAL HIGH (ref 0–99)
Total CHOL/HDL Ratio: 3

## 2012-05-30 LAB — URINALYSIS, ROUTINE W REFLEX MICROSCOPIC
Bilirubin Urine: NEGATIVE
Total Protein, Urine: NEGATIVE
Urine Glucose: NEGATIVE

## 2012-05-30 LAB — HEPATIC FUNCTION PANEL
ALT: 12 U/L (ref 0–35)
AST: 21 U/L (ref 0–37)
Alkaline Phosphatase: 63 U/L (ref 39–117)
Bilirubin, Direct: 0.1 mg/dL (ref 0.0–0.3)
Total Bilirubin: 0.7 mg/dL (ref 0.3–1.2)

## 2012-05-30 LAB — BASIC METABOLIC PANEL
Chloride: 104 mEq/L (ref 96–112)
Potassium: 4.3 mEq/L (ref 3.5–5.1)

## 2012-06-07 ENCOUNTER — Ambulatory Visit (INDEPENDENT_AMBULATORY_CARE_PROVIDER_SITE_OTHER): Payer: BC Managed Care – PPO | Admitting: Internal Medicine

## 2012-06-07 ENCOUNTER — Encounter: Payer: Self-pay | Admitting: Internal Medicine

## 2012-06-07 VITALS — BP 126/72 | HR 77 | Temp 97.9°F | Resp 16 | Wt 158.5 lb

## 2012-06-07 DIAGNOSIS — Z0000 Encounter for general adult medical examination without abnormal findings: Secondary | ICD-10-CM

## 2012-06-07 DIAGNOSIS — M545 Low back pain: Secondary | ICD-10-CM

## 2012-06-07 DIAGNOSIS — Z23 Encounter for immunization: Secondary | ICD-10-CM

## 2012-06-07 DIAGNOSIS — Z2911 Encounter for prophylactic immunotherapy for respiratory syncytial virus (RSV): Secondary | ICD-10-CM

## 2012-06-07 MED ORDER — VITAMIN D 1000 UNITS PO TABS: 1000.0000 [IU] | ORAL_TABLET | Freq: Every day | ORAL | Status: AC

## 2012-06-07 MED ORDER — FUROSEMIDE 20 MG PO TABS: 20.0000 mg | ORAL_TABLET | Freq: Every day | ORAL | Status: DC | PRN

## 2012-06-07 MED ORDER — MELOXICAM 15 MG PO TABS: 15.0000 mg | ORAL_TABLET | Freq: Every day | ORAL | Status: DC | PRN

## 2012-06-07 MED ORDER — CYCLOBENZAPRINE HCL 5 MG PO TABS: 5.0000 mg | ORAL_TABLET | Freq: Every evening | ORAL | Status: AC | PRN

## 2012-06-07 MED ORDER — POTASSIUM CHLORIDE CRYS ER 10 MEQ PO TBCR: 10.0000 meq | EXTENDED_RELEASE_TABLET | Freq: Every day | ORAL | Status: DC

## 2012-06-07 NOTE — Assessment & Plan Note (Addendum)
We discussed age appropriate health related issues, including available/recomended screening tests and vaccinations. We discussed a need for adhering to healthy diet and exercise. Labs/EKG were reviewed/ordered. All questions were answered. Zostavax today Colonosc -- this year

## 2012-06-07 NOTE — Progress Notes (Signed)
Subjective:    Patient ID: [name], female    DOB: July 10, 1951, 61 y.o.   MRN: [id number]  HPI  The patient is here for a wellness exam. The patient has been doing well overall . She has been grieving   She has lost wt on diet.   Wt Readings from Last 3 Encounters:  06/07/12 158 lb 8 oz (71.895 kg)  09/30/11 164 lb (74.39 kg)  09/09/11 164 lb (74.39 kg)   BP Readings from Last 3 Encounters:  06/07/12 126/72  09/30/11 126/70  09/09/11 140/68       Review of Systems  Constitutional: Negative for fever, chills, diaphoresis, activity change, appetite change, fatigue and unexpected weight change.  HENT: Negative for hearing loss, ear pain, nosebleeds, congestion, sore throat, facial swelling, rhinorrhea, sneezing, mouth sores, trouble swallowing, neck pain, neck stiffness, postnasal drip, sinus pressure and tinnitus.   Eyes: Negative for pain, discharge, redness, itching and visual disturbance.  Respiratory: Negative for cough, chest tightness, shortness of breath, wheezing and stridor.   Cardiovascular: Negative for chest pain, palpitations and leg swelling.  Gastrointestinal: Negative for nausea, diarrhea, constipation, blood in stool, abdominal distention, anal bleeding and rectal pain.  Genitourinary: Negative for dysuria, urgency, frequency, hematuria, flank pain, vaginal bleeding, vaginal discharge, difficulty urinating, genital sores and pelvic pain.  Musculoskeletal: Negative for back pain, joint swelling, arthralgias and gait problem.  Skin: Negative.  Negative for rash.  Neurological: Negative for dizziness, tremors, seizures, syncope, speech difficulty, weakness, numbness and headaches.  Hematological: Negative for adenopathy. Does not bruise/bleed easily.  Psychiatric/Behavioral: Negative for suicidal ideas, behavioral problems, disturbed wake/sleep cycle, dysphoric mood and decreased concentration. The patient is not nervous/anxious.         Objective:   Physical Exam  Constitutional: She appears well-developed and well-nourished. No distress.  HENT:  Head: Normocephalic.  Right Ear: External ear normal.  Left Ear: External ear normal.  Nose: Nose normal.  Mouth/Throat: Oropharynx is clear and moist.  Eyes: Conjunctivae are normal. Pupils are equal, round, and reactive to light. Right eye exhibits no discharge. Left eye exhibits no discharge.  Neck: Normal range of motion. Neck supple. No JVD present. No tracheal deviation present. No thyromegaly present.  Cardiovascular: Normal rate, regular rhythm and normal heart sounds.   Pulmonary/Chest: No stridor. No respiratory distress. She has no wheezes.  Abdominal: Soft. Bowel sounds are normal. She exhibits no distension and no mass. There is no tenderness. There is no rebound and no guarding.  Musculoskeletal: She exhibits no edema and no tenderness.  Lymphadenopathy:    She has no cervical adenopathy.  Neurological: She displays normal reflexes. No cranial nerve deficit. She exhibits normal muscle tone. Coordination normal.  Skin: No rash noted. No erythema.  Psychiatric: She has a normal mood and affect. Her behavior is normal. Judgment and thought content normal.   Lab Results  Component Value Date   WBC 6.8 05/30/2012   HGB 13.1 05/30/2012   HCT 38.8 05/30/2012   PLT 260.0 05/30/2012   GLUCOSE 86 05/30/2012   CHOL 177 05/30/2012   TRIG 76.0 05/30/2012   HDL 51.30 05/30/2012   LDLDIRECT 132.3 01/28/2009   LDLCALC 111* 05/30/2012   ALT 12 05/30/2012   AST 21 05/30/2012   NA 139 05/30/2012   K 4.3 05/30/2012   CL 104 05/30/2012   CREATININE 0.7 05/30/2012   BUN 12 05/30/2012   CO2 29 05/30/2012   TSH 0.65 05/30/2012  Assessment & Plan:

## 2012-06-07 NOTE — Assessment & Plan Note (Signed)
Chronic  Meloxicam Add Flexeril

## 2012-06-10 ENCOUNTER — Encounter: Payer: Self-pay | Admitting: Internal Medicine

## 2012-06-22 ENCOUNTER — Encounter: Payer: BC Managed Care – PPO | Admitting: Internal Medicine

## 2012-08-20 ENCOUNTER — Other Ambulatory Visit: Payer: Self-pay | Admitting: Internal Medicine

## 2012-09-06 ENCOUNTER — Telehealth: Payer: Self-pay

## 2012-09-06 NOTE — Telephone Encounter (Signed)
Pt called requesting a copy of recent lab and CPE visit. Copy mailed to pt's home and message left on home VM advising of same.

## 2012-09-07 ENCOUNTER — Telehealth: Payer: Self-pay | Admitting: Internal Medicine

## 2012-09-07 NOTE — Telephone Encounter (Signed)
Labs faxed,pt informed

## 2012-09-07 NOTE — Telephone Encounter (Signed)
[name] called yesterday requesting a copy of labs that she had done in October for the new enrollement period for her insurance.  The results needed to be faxed to her at work [phone]; however wer mailed to her home instead. She is asking that the lab work please be faxed to her today if possible for enrollement period.  Please call for any questions.  Work [phone]  / Valinda Hoar [phone]

## 2012-12-14 ENCOUNTER — Other Ambulatory Visit: Payer: Self-pay | Admitting: Internal Medicine

## 2013-01-21 ENCOUNTER — Ambulatory Visit (INDEPENDENT_AMBULATORY_CARE_PROVIDER_SITE_OTHER): Payer: BC Managed Care – PPO | Admitting: Internal Medicine

## 2013-01-21 ENCOUNTER — Encounter: Payer: Self-pay | Admitting: Internal Medicine

## 2013-01-21 VITALS — BP 150/92 | HR 80 | Temp 97.7°F | Resp 16 | Wt 151.0 lb

## 2013-01-21 DIAGNOSIS — B351 Tinea unguium: Secondary | ICD-10-CM

## 2013-01-21 DIAGNOSIS — L03032 Cellulitis of left toe: Secondary | ICD-10-CM | POA: Insufficient documentation

## 2013-01-21 DIAGNOSIS — L03039 Cellulitis of unspecified toe: Secondary | ICD-10-CM

## 2013-01-21 DIAGNOSIS — F329 Major depressive disorder, single episode, unspecified: Secondary | ICD-10-CM

## 2013-01-21 DIAGNOSIS — E538 Deficiency of other specified B group vitamins: Secondary | ICD-10-CM

## 2013-01-21 HISTORY — DX: Cellulitis of left toe: L03.032

## 2013-01-21 MED ORDER — DOXYCYCLINE HYCLATE 100 MG PO TABS: 100.0000 mg | ORAL_TABLET | Freq: Two times a day (BID) | ORAL | Status: DC

## 2013-01-21 MED ORDER — KETOCONAZOLE 2 % EX CREA: TOPICAL_CREAM | Freq: Every day | CUTANEOUS | Status: DC

## 2013-01-21 NOTE — Progress Notes (Signed)
HPI  C/o L big toe swelling and blister x3-4 d. No pain   She has lost wt on diet.   Wt Readings from Last 3 Encounters:  01/21/13 151 lb (68.493 kg)  06/07/12 158 lb 8 oz (71.895 kg)  09/30/11 164 lb (74.39 kg)   BP Readings from Last 3 Encounters:  01/21/13 150/92  06/07/12 126/72  09/30/11 126/70       Review of Systems  Constitutional: Negative for fever, chills, diaphoresis, activity change, appetite change, fatigue and unexpected weight change.  HENT: Negative for hearing loss, ear pain, nosebleeds, congestion, sore throat, facial swelling, rhinorrhea, sneezing, mouth sores, trouble swallowing, neck pain, neck stiffness, postnasal drip, sinus pressure and tinnitus.   Eyes: Negative for pain, discharge, redness, itching and visual disturbance.  Respiratory: Negative for cough, chest tightness, shortness of breath, wheezing and stridor.   Cardiovascular: Negative for chest pain, palpitations and leg swelling.  Gastrointestinal: Negative for nausea, diarrhea, constipation, blood in stool, abdominal distention, anal bleeding and rectal pain.  Genitourinary: Negative for dysuria, urgency, frequency, hematuria, flank pain, vaginal bleeding, vaginal discharge, difficulty urinating, genital sores and pelvic pain.  Musculoskeletal: Negative for back pain, joint swelling, arthralgias and gait problem.  Skin: Negative.  Negative for rash.  Neurological: Negative for dizziness, tremors, seizures, syncope, speech difficulty, weakness, numbness and headaches.  Hematological: Negative for adenopathy. Does not bruise/bleed easily.  Psychiatric/Behavioral: Negative for suicidal ideas, behavioral problems, sleep disturbance, dysphoric mood and decreased concentration. The patient is not nervous/anxious.         Objective:   Physical Exam  Constitutional: She appears well-developed and well-nourished. No distress.  HENT:  Head: Normocephalic.  Right Ear: External ear normal.  Left  Ear: External ear normal.  Nose: Nose normal.  Mouth/Throat: Oropharynx is clear and moist.  Eyes: Conjunctivae are normal. Pupils are equal, round, and reactive to light. Right eye exhibits no discharge. Left eye exhibits no discharge.  Neck: Normal range of motion. Neck supple. No JVD present. No tracheal deviation present. No thyromegaly present.  Cardiovascular: Normal rate, regular rhythm and normal heart sounds.   Pulmonary/Chest: No stridor. No respiratory distress. She has no wheezes.  Abdominal: Soft. Bowel sounds are normal. She exhibits no distension and no mass. There is no tenderness. There is no rebound and no guarding.  Musculoskeletal: She exhibits no edema and no tenderness.  Lymphadenopathy:    She has no cervical adenopathy.  Neurological: She displays normal reflexes. No cranial nerve deficit. She exhibits normal muscle tone. Coordination normal.  Skin: No rash noted. No erythema.  Psychiatric: She has a normal mood and affect. Her behavior is normal. Judgment and thought content normal.   There is a large blister at the base of L great toe and a cluster of 4-5 blisters. The nail is moving on the nail bed. No heat or erythema.  Lab Results  Component Value Date   WBC 6.8 05/30/2012   HGB 13.1 05/30/2012   HCT 38.8 05/30/2012   PLT 260.0 05/30/2012   GLUCOSE 86 05/30/2012   CHOL 177 05/30/2012   TRIG 76.0 05/30/2012   HDL 51.30 05/30/2012   LDLDIRECT 132.3 01/28/2009   LDLCALC 111* 05/30/2012   ALT 12 05/30/2012   AST 21 05/30/2012   NA 139 05/30/2012   K 4.3 05/30/2012   CL 104 05/30/2012   CREATININE 0.7 05/30/2012   BUN 12 05/30/2012   CO2 29 05/30/2012   TSH 0.65 05/30/2012   Procedure note:  Incision and Drainage  of an Abscess   Indication : a localized collection of pus that is tender and not spontaneously resolving.    Risks including unsuccessful procedure , possible need for a repeat procedure due to pus accumulation, scar formation, and others as well as benefits were explained  to the patient in detail. Written consent was obtained/signed.    The patient was placed in a decubitus position. The area of a L gr toe paronychia/abscess was prepped with povidone-iodine and draped in a sterile fashion.1 cm incision with #11strait blade was made. The roof of the abscess was removed. About 1.5 cc of purulent material was expressed. The cavity was irrigated with the rest of the anesthetic in the syringe and dressed.   The wound was dressed with antibiotic ointment and Telfa pad.  Tolerated well. Complications: None.         Assessment & Plan:

## 2013-01-21 NOTE — Assessment & Plan Note (Addendum)
I&D 3/14 The toenail is going to come off Empiric abx - Doxy Topical Ketoconazole Wound care

## 2013-01-22 DIAGNOSIS — B351 Tinea unguium: Secondary | ICD-10-CM | POA: Insufficient documentation

## 2013-01-22 NOTE — Assessment & Plan Note (Signed)
Continue with current prescription therapy as reflected on the Med list.  

## 2013-01-22 NOTE — Assessment & Plan Note (Signed)
The nail is coming off

## 2013-01-30 ENCOUNTER — Encounter: Payer: Self-pay | Admitting: Internal Medicine

## 2013-01-31 ENCOUNTER — Encounter: Payer: Self-pay | Admitting: Internal Medicine

## 2013-03-01 ENCOUNTER — Encounter: Payer: Self-pay | Admitting: Internal Medicine

## 2013-03-06 ENCOUNTER — Encounter: Payer: Self-pay | Admitting: Internal Medicine

## 2013-03-06 ENCOUNTER — Ambulatory Visit (AMBULATORY_SURGERY_CENTER): Payer: BC Managed Care – PPO | Admitting: *Deleted

## 2013-03-06 VITALS — Ht 63.0 in | Wt 154.4 lb

## 2013-03-06 DIAGNOSIS — Z1211 Encounter for screening for malignant neoplasm of colon: Secondary | ICD-10-CM

## 2013-03-06 DIAGNOSIS — Z800 Family history of malignant neoplasm of digestive organs: Secondary | ICD-10-CM

## 2013-03-06 MED ORDER — MOVIPREP 100 G PO SOLR: ORAL | Status: DC

## 2013-03-08 ENCOUNTER — Encounter: Payer: Self-pay | Admitting: Internal Medicine

## 2013-03-08 ENCOUNTER — Ambulatory Visit (INDEPENDENT_AMBULATORY_CARE_PROVIDER_SITE_OTHER): Payer: BC Managed Care – PPO | Admitting: Internal Medicine

## 2013-03-08 VITALS — BP 120/88 | HR 80 | Temp 98.0°F | Resp 16 | Wt 151.0 lb

## 2013-03-08 DIAGNOSIS — L03032 Cellulitis of left toe: Secondary | ICD-10-CM

## 2013-03-08 DIAGNOSIS — E538 Deficiency of other specified B group vitamins: Secondary | ICD-10-CM

## 2013-03-08 DIAGNOSIS — I10 Essential (primary) hypertension: Secondary | ICD-10-CM

## 2013-03-08 DIAGNOSIS — L03039 Cellulitis of unspecified toe: Secondary | ICD-10-CM

## 2013-03-08 NOTE — Assessment & Plan Note (Signed)
Continue with current prescription therapy as reflected on the Med list.  

## 2013-03-08 NOTE — Assessment & Plan Note (Addendum)
Much better looking new nail. No rash

## 2013-03-08 NOTE — Assessment & Plan Note (Signed)
Off rx Lost wt

## 2013-03-08 NOTE — Progress Notes (Signed)
   HPI  F/u L big toe swelling and blister - resolved. No pain F/u B12 def and HTN   She has lost wt on diet.   Wt Readings from Last 3 Encounters:  03/08/13 151 lb (68.493 kg)  03/06/13 154 lb 6.4 oz (70.035 kg)  01/21/13 151 lb (68.493 kg)   BP Readings from Last 3 Encounters:  03/08/13 120/88  01/21/13 150/92  06/07/12 126/72       Review of Systems  Constitutional: Negative for fever, chills, diaphoresis, activity change, appetite change, fatigue and unexpected weight change.  HENT: Negative for hearing loss, ear pain, nosebleeds, congestion, sore throat, facial swelling, rhinorrhea, sneezing, mouth sores, trouble swallowing, neck pain, neck stiffness, postnasal drip, sinus pressure and tinnitus.   Eyes: Negative for pain, discharge, redness, itching and visual disturbance.  Respiratory: Negative for cough, chest tightness, shortness of breath, wheezing and stridor.   Cardiovascular: Negative for chest pain, palpitations and leg swelling.  Gastrointestinal: Negative for nausea, diarrhea, constipation, blood in stool, abdominal distention, anal bleeding and rectal pain.  Genitourinary: Negative for dysuria, urgency, frequency, hematuria, flank pain, vaginal bleeding, vaginal discharge, difficulty urinating, genital sores and pelvic pain.  Musculoskeletal: Negative for back pain, joint swelling, arthralgias and gait problem.  Skin: Negative.  Negative for rash.  Neurological: Negative for dizziness, tremors, seizures, syncope, speech difficulty, weakness, numbness and headaches.  Hematological: Negative for adenopathy. Does not bruise/bleed easily.  Psychiatric/Behavioral: Negative for suicidal ideas, behavioral problems, sleep disturbance, dysphoric mood and decreased concentration. The patient is not nervous/anxious.         Objective:   Physical Exam  Constitutional: She appears well-developed and well-nourished. No distress.  HENT:  Head: Normocephalic.  Right Ear:  External ear normal.  Left Ear: External ear normal.  Nose: Nose normal.  Mouth/Throat: Oropharynx is clear and moist.  Eyes: Conjunctivae are normal. Pupils are equal, round, and reactive to light. Right eye exhibits no discharge. Left eye exhibits no discharge.  Neck: Normal range of motion. Neck supple. No JVD present. No tracheal deviation present. No thyromegaly present.  Cardiovascular: Normal rate, regular rhythm and normal heart sounds.   Pulmonary/Chest: No stridor. No respiratory distress. She has no wheezes.  Abdominal: Soft. Bowel sounds are normal. She exhibits no distension and no mass. There is no tenderness. There is no rebound and no guarding.  Musculoskeletal: She exhibits no edema and no tenderness.  Lymphadenopathy:    She has no cervical adenopathy.  Neurological: She displays normal reflexes. No cranial nerve deficit. She exhibits normal muscle tone. Coordination normal.  Skin: No rash noted. No erythema.  Psychiatric: She has a normal mood and affect. Her behavior is normal. Judgment and thought content normal.  Much better looking new nail. No rash  Lab Results  Component Value Date   WBC 6.8 05/30/2012   HGB 13.1 05/30/2012   HCT 38.8 05/30/2012   PLT 260.0 05/30/2012   GLUCOSE 86 05/30/2012   CHOL 177 05/30/2012   TRIG 76.0 05/30/2012   HDL 51.30 05/30/2012   LDLDIRECT 132.3 01/28/2009   LDLCALC 111* 05/30/2012   ALT 12 05/30/2012   AST 21 05/30/2012   NA 139 05/30/2012   K 4.3 05/30/2012   CL 104 05/30/2012   CREATININE 0.7 05/30/2012   BUN 12 05/30/2012   CO2 29 05/30/2012   TSH 0.65 05/30/2012             Assessment & Plan:

## 2013-03-20 ENCOUNTER — Ambulatory Visit (AMBULATORY_SURGERY_CENTER): Payer: BC Managed Care – PPO | Admitting: Internal Medicine

## 2013-03-20 ENCOUNTER — Encounter: Payer: Self-pay | Admitting: Internal Medicine

## 2013-03-20 VITALS — BP 121/79 | HR 66 | Temp 97.2°F | Resp 48 | Ht 63.0 in | Wt 154.0 lb

## 2013-03-20 DIAGNOSIS — Z800 Family history of malignant neoplasm of digestive organs: Secondary | ICD-10-CM

## 2013-03-20 DIAGNOSIS — Z1211 Encounter for screening for malignant neoplasm of colon: Secondary | ICD-10-CM

## 2013-03-20 MED ORDER — SODIUM CHLORIDE 0.9 % IV SOLN: 500.0000 mL | INTRAVENOUS | Status: DC

## 2013-03-20 NOTE — Progress Notes (Signed)
Patient did not experience any of the following events: a burn prior to discharge; a fall within the facility; wrong site/side/patient/procedure/implant event; or a hospital transfer or hospital admission upon discharge from the facility. (G8907) Patient did not have preoperative order for IV antibiotic SSI prophylaxis. (G8918)  

## 2013-03-20 NOTE — Patient Instructions (Addendum)
Discharge instructions given with verbal understanding. Normal exam. Resume previous medications. YOU HAD AN ENDOSCOPIC PROCEDURE TODAY AT THE [facility]: Refer to the procedure report that was given to you for any specific questions about what was found during the examination.  If the procedure report does not answer your questions, please call your gastroenterologist to clarify.  If you requested that your care partner not be given the details of your procedure findings, then the procedure report has been included in a sealed envelope for you to review at your convenience later.  YOU SHOULD EXPECT: Some feelings of bloating in the abdomen. Passage of more gas than usual.  Walking can help get rid of the air that was put into your GI tract during the procedure and reduce the bloating. If you had a lower endoscopy (such as a colonoscopy or flexible sigmoidoscopy) you may notice spotting of blood in your stool or on the toilet paper. If you underwent a bowel prep for your procedure, then you may not have a normal bowel movement for a few days.  DIET: Your first meal following the procedure should be a light meal and then it is ok to progress to your normal diet.  A half-sandwich or bowl of soup is an example of a good first meal.  Heavy or fried foods are harder to digest and may make you feel nauseous or bloated.  Likewise meals heavy in dairy and vegetables can cause extra gas to form and this can also increase the bloating.  Drink plenty of fluids but you should avoid alcoholic beverages for 24 hours.  ACTIVITY: Your care partner should take you home directly after the procedure.  You should plan to take it easy, moving slowly for the rest of the day.  You can resume normal activity the day after the procedure however you should NOT DRIVE or use heavy machinery for 24 hours (because of the sedation medicines used during the test).    SYMPTOMS TO REPORT IMMEDIATELY: A gastroenterologist  can be reached at any hour.  During normal business hours, 8:30 AM to 5:00 PM Monday through Friday, call [phone].  After hours and on weekends, please call the GI answering service at [phone] who will take a message and have the physician on call contact you.   Following lower endoscopy (colonoscopy or flexible sigmoidoscopy):  Excessive amounts of blood in the stool  Significant tenderness or worsening of abdominal pains  Swelling of the abdomen that is new, acute  Fever of 100F or higher  FOLLOW UP: If any biopsies were taken you will be contacted by phone or by letter within the next 1-3 weeks.  Call your gastroenterologist if you have not heard about the biopsies in 3 weeks.  Our staff will call the home number listed on your records the next business day following your procedure to check on you and address any questions or concerns that you may have at that time regarding the information given to you following your procedure. This is a courtesy call and so if there is no answer at the home number and we have not heard from you through the emergency physician on call, we will assume that you have returned to your regular daily activities without incident.  SIGNATURES/CONFIDENTIALITY: You and/or your care partner have signed paperwork which will be entered into your electronic medical record.  These signatures attest to the fact that that the information above on your After Visit Summary has been reviewed   and is understood.  Full responsibility of the confidentiality of this discharge information lies with you and/or your care-partner. 

## 2013-03-20 NOTE — Op Note (Signed)
[facility] [address] [place], [address]   COLONOSCOPY PROCEDURE REPORT  PATIENT: [name], [name]  MR#: [id number] BIRTHDATE: [birth year] , 61  yrs. old GENDER: Female ENDOSCOPIST: Hart Carwin, MD REFERRED BY:  Janeal Holmes, M.D. PROCEDURE DATE:  03/20/2013 PROCEDURE:   Colonoscopy, screening ASA CLASS:   Class II INDICATIONS:Patient's personal history of colon polyps and Family history of colon cancer, distant relative(s).- grandparent, last colonoscopy  in 2006 MEDICATIONS: MAC sedation, administered by CRNA and propofol (Diprivan) 200mg  IV  DESCRIPTION OF PROCEDURE:   After the risks and benefits and of the procedure were explained, informed consent was obtained.  A digital rectal exam revealed no abnormalities of the rectum.    The LB [id number]  endoscope was introduced through the anus and advanced to the cecum, which was identified by both the appendix and ileocecal valve .  The quality of the prep was good, using MoviPrep .  The instrument was then slowly withdrawn as the colon was fully examined.     COLON FINDINGS: Mild diverticulosis was noted in the sigmoid colon. Retroflexed views revealed no abnormalities.     The scope was then withdrawn from the patient and the procedure completed.  COMPLICATIONS: There were no complications. ENDOSCOPIC IMPRESSION: Mild diverticulosis was noted in the sigmoid colon  RECOMMENDATIONS: High fiber diet   REPEAT EXAM: In 10 year(s)  for Colonoscopy.  cc:  _______________________________ eSignedHart Carwin, MD 03/20/2013 11:50 AM

## 2013-03-21 ENCOUNTER — Telehealth: Payer: Self-pay | Admitting: *Deleted

## 2013-03-21 NOTE — Telephone Encounter (Signed)
  Follow up Call-  Call back number 03/20/2013  Post procedure Call Back phone  # [phone]  Permission to leave phone message Yes     Patient questions:  Message left for patient to call if necessary.

## 2013-04-01 ENCOUNTER — Ambulatory Visit (INDEPENDENT_AMBULATORY_CARE_PROVIDER_SITE_OTHER): Payer: BC Managed Care – PPO | Admitting: Internal Medicine

## 2013-04-01 ENCOUNTER — Encounter: Payer: Self-pay | Admitting: Internal Medicine

## 2013-04-01 VITALS — BP 128/78 | HR 89 | Temp 97.7°F

## 2013-04-01 DIAGNOSIS — J069 Acute upper respiratory infection, unspecified: Secondary | ICD-10-CM

## 2013-04-01 NOTE — Patient Instructions (Signed)

## 2013-04-01 NOTE — Progress Notes (Signed)
HPI  Pt presents to the clinic today with c/o cold symptoms x 4 days. The worst part is the sore throat and dry cough.She does not produce any sputum. She is not sure if it is a cold or a sinus infection. She denies fever, chills or body aches. She has no history of allergies or asthma. She has not had sick contacts.  Review of Systems      Past Medical History  Diagnosis Date  . Depression   . OA (osteoarthritis)   . Vitamin B 12 deficiency   . LBP (low back pain)   . Hyperlipidemia     Family History  Problem Relation Age of Onset  . Pneumonia Father   . Diabetes Other   . Hypertension Other   . Diabetes Mother   . Heart disease Mother   . Hypertension Mother   . Pancreatic cancer Mother   . Liver cancer Mother   . Colon cancer Maternal Grandmother 40    History   Social History  . Marital Status: Single    Spouse Name: N/A    Number of Children: N/A  . Years of Education: N/A   Occupational History  . Not on file.   Social History Main Topics  . Smoking status: Never Smoker   . Smokeless tobacco: Never Used  . Alcohol Use: No  . Drug Use: No  . Sexually Active: Not Currently   Other Topics Concern  . Not on file   Social History Narrative   Looking after her parents    Allergies  Allergen Reactions  . Other Nausea And Vomiting and Other (See Comments)    Mycins-dizziness     Constitutional: Positive headache. Denies fatigue, fever or abrupt weight changes.  HEENT:  Positive sore throat. Denies eye redness, eye pain, pressure behind the eyes, facial pain, nasal congestion, ear pain, ringing in the ears, wax buildup, runny nose or bloody nose. Respiratory: Positive cough. Denies difficulty breathing or shortness of breath.  Cardiovascular: Denies chest pain, chest tightness, palpitations or swelling in the hands or feet.   No other specific complaints in a complete review of systems (except as listed in HPI above).  Objective:   BP 128/78   Pulse 89  Temp(Src) 97.7 F (36.5 C) (Oral)  SpO2 99% Wt Readings from Last 3 Encounters:  03/20/13 154 lb (69.854 kg)  03/08/13 151 lb (68.493 kg)  03/06/13 154 lb 6.4 oz (70.035 kg)     General: Appears her stated age, well developed, well nourished in NAD. HEENT: Head: normal shape and size; Eyes: sclera white, no icterus, conjunctiva pink, PERRLA and EOMs intact; Ears: Tm's gray and intact, normal light reflex; Nose: mucosa pink and moist, septum midline; Throat/Mouth: + PND. Teeth present, mucosa erythematous and moist, no exudate noted, no lesions or ulcerations noted.  Neck: Mild cervical lymphadenopathy. Neck supple, trachea midline. No massses, lumps or thyromegaly present.  Cardiovascular: Normal rate and rhythm. S1,S2 noted.  No murmur, rubs or gallops noted. No JVD or BLE edema. No carotid bruits noted. Pulmonary/Chest: Normal effort and positive vesicular breath sounds. No respiratory distress. No wheezes, rales or ronchi noted.      Assessment & Plan:   Upper Respiratory Infection, new onset:  Get some rest and drink plenty of water Do salt water gargles for the sore throat Continue Robitussin and mucinex  RTC as needed or if symptoms persist.

## 2013-04-12 ENCOUNTER — Other Ambulatory Visit: Payer: Self-pay | Admitting: Internal Medicine

## 2013-07-30 ENCOUNTER — Encounter: Payer: Self-pay | Admitting: Internal Medicine

## 2013-07-30 ENCOUNTER — Ambulatory Visit (INDEPENDENT_AMBULATORY_CARE_PROVIDER_SITE_OTHER): Payer: BC Managed Care – PPO | Admitting: Internal Medicine

## 2013-07-30 VITALS — BP 130/70 | HR 81 | Temp 97.4°F | Ht 63.0 in | Wt 153.0 lb

## 2013-07-30 DIAGNOSIS — M19012 Primary osteoarthritis, left shoulder: Secondary | ICD-10-CM

## 2013-07-30 DIAGNOSIS — Z0000 Encounter for general adult medical examination without abnormal findings: Secondary | ICD-10-CM

## 2013-07-30 DIAGNOSIS — I10 Essential (primary) hypertension: Secondary | ICD-10-CM

## 2013-07-30 DIAGNOSIS — F329 Major depressive disorder, single episode, unspecified: Secondary | ICD-10-CM

## 2013-07-30 DIAGNOSIS — M545 Low back pain, unspecified: Secondary | ICD-10-CM

## 2013-07-30 DIAGNOSIS — K589 Irritable bowel syndrome without diarrhea: Secondary | ICD-10-CM

## 2013-07-30 DIAGNOSIS — F3289 Other specified depressive episodes: Secondary | ICD-10-CM

## 2013-07-30 DIAGNOSIS — E785 Hyperlipidemia, unspecified: Secondary | ICD-10-CM

## 2013-07-30 DIAGNOSIS — E538 Deficiency of other specified B group vitamins: Secondary | ICD-10-CM

## 2013-07-30 MED ORDER — MELOXICAM 15 MG PO TABS
15.0000 mg | ORAL_TABLET | Freq: Every day | ORAL | Status: DC | PRN
Start: 2013-07-30 — End: 2015-02-09

## 2013-07-30 MED ORDER — HYOSCYAMINE SULFATE ER 0.375 MG PO TB12: 0.3750 mg | ORAL_TABLET | Freq: Two times a day (BID) | ORAL | Status: DC | PRN

## 2013-07-30 MED ORDER — VITAMIN D 1000 UNITS PO TABS: 1000.0000 [IU] | ORAL_TABLET | Freq: Every day | ORAL | Status: AC

## 2013-07-30 MED ORDER — CITALOPRAM HYDROBROMIDE 20 MG PO TABS: ORAL_TABLET | ORAL | Status: DC

## 2013-07-30 NOTE — Assessment & Plan Note (Signed)
Resolved

## 2013-07-30 NOTE — Assessment & Plan Note (Signed)
Continue with current prescription therapy as reflected on the Med list.  

## 2013-07-30 NOTE — Assessment & Plan Note (Addendum)
We discussed age appropriate health related issues, including available/recomended screening tests and vaccinations. We discussed a need for adhering to healthy diet and exercise. Labs/EKG were reviewed/ordered. All questions were answered. EKG - ok [name] had a colonoscopy Labs

## 2013-07-30 NOTE — Assessment & Plan Note (Signed)
See Rx ROM exercises Dr Katrinka Blazing if not better in 1 mo

## 2013-07-30 NOTE — Assessment & Plan Note (Signed)
Levbid prn

## 2013-07-30 NOTE — Progress Notes (Signed)
Subjective:    HPI  The patient is here for a wellness exam. The patient has been doing well overall . She has been having pain in the L shoulder x 6-8 wks; worse w/ROM  She has lost wt on diet.   Wt Readings from Last 3 Encounters:  07/30/13 153 lb (69.4 kg)  03/20/13 154 lb (69.854 kg)  03/08/13 151 lb (68.493 kg)   BP Readings from Last 3 Encounters:  07/30/13 130/70  04/01/13 128/78  03/20/13 121/79       Review of Systems  Constitutional: Negative for fever, chills, diaphoresis, activity change, appetite change, fatigue and unexpected weight change.  HENT: Negative for hearing loss, ear pain, nosebleeds, congestion, sore throat, facial swelling, rhinorrhea, sneezing, mouth sores, trouble swallowing, neck pain, neck stiffness, postnasal drip, sinus pressure and tinnitus.   Eyes: Negative for pain, discharge, redness, itching and visual disturbance.  Respiratory: Negative for cough, chest tightness, shortness of breath, wheezing and stridor.   Cardiovascular: Negative for chest pain, palpitations and leg swelling.  Gastrointestinal: Negative for nausea, diarrhea, constipation, blood in stool, abdominal distention, anal bleeding and rectal pain.  Genitourinary: Negative for dysuria, urgency, frequency, hematuria, flank pain, vaginal bleeding, vaginal discharge, difficulty urinating, genital sores and pelvic pain.  Musculoskeletal: Negative for back pain, joint swelling, arthralgias and gait problem.  Skin: Negative.  Negative for rash.  Neurological: Negative for dizziness, tremors, seizures, syncope, speech difficulty, weakness, numbness and headaches.  Hematological: Negative for adenopathy. Does not bruise/bleed easily.  Psychiatric/Behavioral: Negative for suicidal ideas, behavioral problems, sleep disturbance, dysphoric mood and decreased concentration. The patient is not nervous/anxious.         Objective:   Physical Exam  Constitutional: She appears  well-developed and well-nourished. No distress.  HENT:  Head: Normocephalic.  Right Ear: External ear normal.  Left Ear: External ear normal.  Nose: Nose normal.  Mouth/Throat: Oropharynx is clear and moist.  Eyes: Conjunctivae are normal. Pupils are equal, round, and reactive to light. Right eye exhibits no discharge. Left eye exhibits no discharge.  Neck: Normal range of motion. Neck supple. No JVD present. No tracheal deviation present. No thyromegaly present.  Cardiovascular: Normal rate, regular rhythm and normal heart sounds.   Pulmonary/Chest: No stridor. No respiratory distress. She has no wheezes.  Abdominal: Soft. Bowel sounds are normal. She exhibits no distension and no mass. There is no tenderness. There is no rebound and no guarding.  Musculoskeletal: She exhibits no edema and no tenderness.  Lymphadenopathy:    She has no cervical adenopathy.  Neurological: She displays normal reflexes. No cranial nerve deficit. She exhibits normal muscle tone. Coordination normal.  Skin: No rash noted. No erythema.  Psychiatric: She has a normal mood and affect. Her behavior is normal. Judgment and thought content normal.  R houlder is tender posteriorly  Lab Results  Component Value Date   WBC 6.8 05/30/2012   HGB 13.1 05/30/2012   HCT 38.8 05/30/2012   PLT 260.0 05/30/2012   GLUCOSE 86 05/30/2012   CHOL 177 05/30/2012   TRIG 76.0 05/30/2012   HDL 51.30 05/30/2012   LDLDIRECT 132.3 01/28/2009   LDLCALC 111* 05/30/2012   ALT 12 05/30/2012   AST 21 05/30/2012   NA 139 05/30/2012   K 4.3 05/30/2012   CL 104 05/30/2012   CREATININE 0.7 05/30/2012   BUN 12 05/30/2012   CO2 29 05/30/2012   TSH 0.65 05/30/2012          Assessment & Plan:

## 2013-08-02 ENCOUNTER — Other Ambulatory Visit (INDEPENDENT_AMBULATORY_CARE_PROVIDER_SITE_OTHER): Payer: BC Managed Care – PPO

## 2013-08-02 DIAGNOSIS — Z0000 Encounter for general adult medical examination without abnormal findings: Secondary | ICD-10-CM

## 2013-08-02 DIAGNOSIS — E538 Deficiency of other specified B group vitamins: Secondary | ICD-10-CM

## 2013-08-02 LAB — BASIC METABOLIC PANEL
Calcium: 9.5 mg/dL (ref 8.4–10.5)
Chloride: 105 mEq/L (ref 96–112)
Creatinine, Ser: 0.7 mg/dL (ref 0.4–1.2)
Sodium: 142 mEq/L (ref 135–145)

## 2013-08-02 LAB — URINALYSIS
Bilirubin Urine: NEGATIVE
Hgb urine dipstick: NEGATIVE
Ketones, ur: NEGATIVE
Leukocytes, UA: NEGATIVE
Specific Gravity, Urine: <=1.005 (ref 1.000–1.030)
Urine Glucose: NEGATIVE
Urobilinogen, UA: 0.2 (ref 0.0–1.0)

## 2013-08-02 LAB — CBC WITH DIFFERENTIAL/PLATELET
Basophils Relative: 0.5 % (ref 0.0–3.0)
Eosinophils Relative: 1.8 % (ref 0.0–5.0)
Lymphocytes Relative: 29 % (ref 12.0–46.0)
Lymphs Abs: 1.9 10*3/uL (ref 0.7–4.0)
Neutrophils Relative %: 61.7 % (ref 43.0–77.0)
RBC: 4.27 Mil/uL (ref 3.87–5.11)
WBC: 6.7 10*3/uL (ref 4.5–10.5)

## 2013-08-02 LAB — VITAMIN B12: Vitamin B-12: 558 pg/mL (ref 211–911)

## 2013-08-02 LAB — LIPID PANEL
HDL: 53 mg/dL (ref >39.00)
LDL Cholesterol: 121 mg/dL — ABNORMAL HIGH (ref 0–99)
Total CHOL/HDL Ratio: 4
Triglycerides: 68 mg/dL (ref 0.0–149.0)
VLDL: 13.6 mg/dL (ref 0.0–40.0)

## 2013-08-02 LAB — TSH: TSH: 0.88 u[IU]/mL (ref 0.35–5.50)

## 2013-08-02 LAB — HEPATIC FUNCTION PANEL
AST: 23 U/L (ref 0–37)
Albumin: 4 g/dL (ref 3.5–5.2)
Bilirubin, Direct: 0.1 mg/dL (ref 0.0–0.3)
Total Bilirubin: 0.6 mg/dL (ref 0.3–1.2)

## 2013-08-05 ENCOUNTER — Encounter: Payer: Self-pay | Admitting: Internal Medicine

## 2013-08-26 ENCOUNTER — Ambulatory Visit: Payer: BC Managed Care – PPO | Admitting: Family Medicine

## 2013-08-29 ENCOUNTER — Other Ambulatory Visit: Payer: Self-pay

## 2013-09-03 ENCOUNTER — Encounter: Payer: Self-pay | Admitting: Family Medicine

## 2013-09-03 ENCOUNTER — Other Ambulatory Visit (INDEPENDENT_AMBULATORY_CARE_PROVIDER_SITE_OTHER): Payer: BC Managed Care – PPO

## 2013-09-03 ENCOUNTER — Ambulatory Visit (INDEPENDENT_AMBULATORY_CARE_PROVIDER_SITE_OTHER): Payer: BC Managed Care – PPO | Admitting: Family Medicine

## 2013-09-03 VITALS — BP 118/76 | HR 88 | Wt 152.0 lb

## 2013-09-03 DIAGNOSIS — M25512 Pain in left shoulder: Secondary | ICD-10-CM

## 2013-09-03 DIAGNOSIS — M25519 Pain in unspecified shoulder: Secondary | ICD-10-CM

## 2013-09-03 NOTE — Progress Notes (Signed)
I'm seeing this patient by the request  of:  Sonda Primes, MD   CC: Left shoulder pain  HPI: Patient is a very pleasant 62 year old female who has had left shoulder pain.  Patient states it has been getting worse over the course last 2-3 months. Patient states that she has an intermittent sharp pain with certain movements. Patient states that she can't no pain at rest. Patient states it is worse when she reaches behind her back or overhead. Patient denies any radiation down the arm, any numbness or any weakness in the extremity. Patient also denies any neck pain. Patient denies any nighttime awakening from states it is not affecting any of her activities of daily living. Patient is a severity of 2 at 10. Patient has not done any over-the-counter or home remedies at this time. Patient does have a prescription for meloxicam as she uses on an as-needed basis for other aches and pains.   Past medical, surgical, family and social history reviewed. Medications reviewed all in the electronic medical record.   Review of Systems: No headache, visual changes, nausea, vomiting, diarrhea, constipation, dizziness, abdominal pain, skin rash, fevers, chills, night sweats, weight loss, swollen lymph nodes, body aches, joint swelling, muscle aches, chest pain, shortness of breath, mood changes.   Objective:    Blood pressure 118/76, pulse 88, weight 152 lb (68.947 kg), SpO2 96.00%.   General: No apparent distress alert and oriented x3 mood and affect normal, dressed appropriately.  HEENT: Pupils equal, extraocular movements intact Respiratory: Patient's speak in full sentences and does not appear short of breath Cardiovascular: No lower extremity edema, non tender, no erythema Skin: Warm dry intact with no signs of infection or rash on extremities or on axial skeleton. Abdomen: Soft nontender Neuro: Cranial nerves II through XII are intact, neurovascularly intact in all extremities with 2+ DTRs and 2+  pulses. Lymph: No lymphadenopathy of posterior or anterior cervical chain or axillae bilaterally.  Gait normal with good balance and coordination.  MSK: Non tender with full range of motion and good stability and symmetric strength and tone of elbows, wrist, hip, knee and ankles bilaterally.  Shoulder: Left shoulder Inspection reveals no abnormalities, atrophy or asymmetry. Palpation is normal with no tenderness over AC joint or bicipital groove. ROM is full in all planes. Patient does have mild pain with the last 10 of forward flexion passively. Rotator cuff strength normal throughout. No signs of impingement with negative Neer and Hawkin's tests, empty can sign. Speeds and Yergason's tests normal. No labral pathology noted with negative Obrien's, negative clunk and good stability. Normal scapular function observed. No painful arc and no drop arm sign. No apprehension sign Contralateral shoulder unremarkable  MSK US performed of: Left shoulder This study was ordered, performed, and interpreted by Terrilee Files D.O.  Shoulder:   Supraspinatus: Does have what appears to be a degenerative tear. This approximately a 30% of the tendon. There is no acute swelling but does have some mild bursal bulge occurring. No retraction noted.  Infraspinatus:  Appears normal on long and transverse views. Subscapularis:  Appears normal on long and transverse views. Teres Minor:  Appears normal on long and transverse views. AC joint:  Capsule undistended, no geyser sign. Does have moderate Oster arthritic changes Glenohumeral Joint:  Appears normal without effusion. Moderate Oster arthritic changes Glenoid Labrum:  Intact without visualized tears. Moderate osteoarthritic changes Biceps Tendon:  Appears normal on long and transverse views, no fraying of tendon, tendon located in intertubercular groove, no  subluxation with shoulder internal or external rotation. No increased power doppler signal.  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   Impression and Recommendations:     This case required medical decision making of moderate complexity.

## 2013-09-03 NOTE — Assessment & Plan Note (Signed)
Patient shoulder pain is likely some bursitis as well as having a degenerative rotator cuff tear. Patient will do home exercise program, over-the-counter medications, icing protocol. Patient will try this for 3-4 weeks. If she continues to have the discomfort we would consider doing an injection.

## 2013-09-03 NOTE — Patient Instructions (Signed)
Very nice to meet you For you shoulder do exercises at least most days of the week Tylenol 650 mg three times a day can help all aches and pain Aleve as needed twice daily Fish oil 3 grams daily Vitamin D 1000IU daily.  Tumeric 500mg  twice daily can decrease inflammation.  For yor finger read handout and consider warm hand baths with moving finger.  Squeeze ball when sitting and message.  For exercise continue the biking 30 minutes most days of the week  Resistance training with light weight 3 times a week, focusing on doing 3 sets of light weight of 12 reps.  (5# max) Come back and see me again in 4 weeks. If shoulder still hurts would consider injection.

## 2013-10-04 ENCOUNTER — Other Ambulatory Visit (INDEPENDENT_AMBULATORY_CARE_PROVIDER_SITE_OTHER): Payer: BC Managed Care – PPO

## 2013-10-04 ENCOUNTER — Encounter: Payer: Self-pay | Admitting: Family Medicine

## 2013-10-04 ENCOUNTER — Ambulatory Visit (INDEPENDENT_AMBULATORY_CARE_PROVIDER_SITE_OTHER): Payer: BC Managed Care – PPO | Admitting: Family Medicine

## 2013-10-04 VITALS — BP 138/76 | HR 78

## 2013-10-04 DIAGNOSIS — M6530 Trigger finger, unspecified finger: Secondary | ICD-10-CM

## 2013-10-04 DIAGNOSIS — M6790 Unspecified disorder of synovium and tendon, unspecified site: Secondary | ICD-10-CM

## 2013-10-04 DIAGNOSIS — M25512 Pain in left shoulder: Secondary | ICD-10-CM

## 2013-10-04 DIAGNOSIS — M67922 Unspecified disorder of synovium and tendon, left upper arm: Secondary | ICD-10-CM

## 2013-10-04 DIAGNOSIS — M25519 Pain in unspecified shoulder: Secondary | ICD-10-CM

## 2013-10-04 NOTE — Patient Instructions (Signed)
You are doing great.  We will focus more on the bicep and see if that helps.  I think there is some scar tissue Try to do these exercises most days of the week Consider a compression sleeve with activity.  Ice in the area after activity. Continue soaks for the finger.  Come back again in 3-4 weeks and if still in pain will do injections

## 2013-10-04 NOTE — Assessment & Plan Note (Signed)
Right ring finger. Patient states she would like to continue with the soaking this she thinks it is improving. Patient continues to have pain in 3 weeks I would like to do an injection of this as well.

## 2013-10-04 NOTE — Progress Notes (Signed)
Pre-visit discussion using our clinic review tool. No additional management support is needed unless otherwise documented below in the visit note.  

## 2013-10-04 NOTE — Progress Notes (Signed)
  CC: Followup left shoulder pain  HPI: Patient is returning today for followup of her left shoulder pain. 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 Patient also had arthritic changes of the a.c. joint. Patient was given exercises to do, discussed over-the-counter medications, and discussed icing protocol. Patient states she is approximately 50% better. Patient states that the pain seems to be more localized to the anterior aspect of her arm than the shoulder itself no. Patient denies any worsening neck pain or shoulder pain and actually states that this seems to be better. Patient is also improved her range of motion.  Past medical, surgical, family and social history reviewed. Medications reviewed all in the electronic medical record.   Review of Systems: No headache, visual changes, nausea, vomiting, diarrhea, constipation, dizziness, abdominal pain, skin rash, fevers, chills, night sweats, weight loss, swollen lymph nodes, body aches, joint swelling, muscle aches, chest pain, shortness of breath, mood changes.   Objective:    Blood pressure 138/76, pulse 78, SpO2 98.00%.   General: No apparent distress alert and oriented x3 mood and affect normal, dressed appropriately.  HEENT: Pupils equal, extraocular movements intact Respiratory: Patient's speak in full sentences and does not appear short of breath Cardiovascular: No lower extremity edema, non tender, no erythema Skin: Warm dry intact with no signs of infection or rash on extremities or on axial skeleton. Abdomen: Soft nontender Neuro: Cranial nerves II through XII are intact, neurovascularly intact in all extremities with 2+ DTRs and 2+ pulses. Lymph: No lymphadenopathy of posterior or anterior cervical chain or axillae bilaterally.  Gait normal with good balance and coordination.  MSK: Non tender with full range of motion and good  stability and symmetric strength and tone of elbows, wrist, hip, knee and ankles bilaterally.  Shoulder: Left shoulder Inspection reveals no abnormalities, atrophy or asymmetry. Palpation is normal with no tenderness over AC joint or bicipital groove. ROM is full in all planes.  Rotator cuff strength normal throughout. No signs of impingement with negative Neer and Hawkin's tests, empty can sign. Speeds and Yergason's tests mildly positive. Palpation of the bicep 2 patient does have mild pain about 4 cm from its origin. No labral pathology noted with negative Obrien's, negative clunk and good stability. Normal scapular function observed. No painful arc and no drop arm sign. No apprehension sign Contralateral shoulder unremarkable Patient's hand exam on the right side also shows patient has a trigger finger of the A2 pulley on the ring finger. MSK US performed of: Left shoulder This study was ordered, performed, and interpreted by Terrilee Files D.O.  Shoulder:   Limited ultrasound shows a patient does have scarring in the biceps muscle itself. No true tear appreciated but increased Doppler flow. Tendon itself looks unremarkable Impression: Scar tissue in bicep muscle  Impression and Recommendations:     This case required medical decision making of moderate complexity.

## 2013-10-04 NOTE — Assessment & Plan Note (Signed)
Patient has more specific findings for the biceps in the shoulder itself. Patient has made some range of motion increase and has less pain of the shoulder joint. Patient does have degenerative changes in that shoulder and this could be referred pain. Patient would like to continue with the conservative measure and try exercises, compression and icing. Patient will return again in 3 weeks. If she continues to have pain overnight to do a diagnostic and hopefully therapeutic injection of the shoulder to see if this is referred pain.

## 2013-10-25 ENCOUNTER — Ambulatory Visit: Payer: BC Managed Care – PPO | Admitting: Family Medicine

## 2013-11-01 ENCOUNTER — Encounter: Payer: Self-pay | Admitting: Family Medicine

## 2013-11-01 ENCOUNTER — Ambulatory Visit (INDEPENDENT_AMBULATORY_CARE_PROVIDER_SITE_OTHER): Payer: BC Managed Care – PPO | Admitting: Family Medicine

## 2013-11-01 VITALS — BP 126/72 | HR 78

## 2013-11-01 DIAGNOSIS — M25512 Pain in left shoulder: Secondary | ICD-10-CM

## 2013-11-01 DIAGNOSIS — M25519 Pain in unspecified shoulder: Secondary | ICD-10-CM

## 2013-11-01 DIAGNOSIS — M6530 Trigger finger, unspecified finger: Secondary | ICD-10-CM

## 2013-11-01 NOTE — Progress Notes (Signed)
CC: Followup left shoulder pain  HPI: Patient is returning today for followup of her left shoulder pain. 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 Patient also had arthritic changes of the a.c. joint. Patient was given exercises to do, discussed over-the-counter medications, and discussed icing protocol. Patient was doing approximately 50% better at last visit. Patient states that her arm is feeling a little bit better but unfortunately her left shoulder pain seems to have returned a little bit. Patient still says she's approximately another 20% better. Patient denies any new symptoms. Patient continues to do the exercises fairly regularly and takes meloxicam daily to  Patient is also here with a new problem. Patient is stating that she has a trigger finger. Patient's trigger finger has started to get painful. Patient states that it is increasing in frequency and duration. Patient states that she has tried the over-the-counter medicines, the meloxicam, the icing and the exercises I have given her with no improvement. Patient would like an injection today. Denies any numbness or weakness.  Past medical, surgical, family and social history reviewed. Medications reviewed all in the electronic medical record.   Review of Systems: No headache, visual changes, nausea, vomiting, diarrhea, constipation, dizziness, abdominal pain, skin rash, fevers, chills, night sweats, weight loss, swollen lymph nodes, body aches, joint swelling, muscle aches, chest pain, shortness of breath, mood changes.   Objective:    There were no vitals taken for this visit.   General: No apparent distress alert and oriented x3 mood and affect normal, dressed appropriately.  HEENT: Pupils equal, extraocular movements intact Respiratory: Patient's speak in full sentences and does not appear short of breath Cardiovascular: No  lower extremity edema, non tender, no erythema Skin: Warm dry intact with no signs of infection or rash on extremities or on axial skeleton. Abdomen: Soft nontender Neuro: Cranial nerves II through XII are intact, neurovascularly intact in all extremities with 2+ DTRs and 2+ pulses. Lymph: No lymphadenopathy of posterior or anterior cervical chain or axillae bilaterally.  Gait normal with good balance and coordination.  MSK: Non tender with full range of motion and good stability and symmetric strength and tone of elbows, wrist, hip, knee and ankles bilaterally.  Shoulder: Left shoulder Inspection reveals no abnormalities, atrophy or asymmetry. Palpation is normal with no tenderness over AC joint or bicipital groove. ROM is full in all planes.  Rotator cuff strength normal throughout. A positive signs of impingement with positive Neer and Hawkin's tests, negative empty can sign. No labral pathology noted with negative Obrien's, negative clunk and good stability. Normal scapular function observed. No painful arc and no drop arm sign. No apprehension sign Contralateral shoulder unremarkable Patient's hand exam on the right side also shows patient has a trigger finger of the A2 pulley on the ring finger.  Consent patient was prepped with alcohol swabs and did have an injection with 0.5 cc of 0.5% Marcaine and 0.5 cc of Kenalog 40 mg/dL with a [id number]-gauge half-inch needle into the tendon sheath. Patient tolerated this procedure very well and had complete resolution of the pain but continued to have some trigger which is expected. Post injection instructions given.  After informed written and verbal consent, patient was seated on exam table. Left shoulder was prepped with alcohol swab and utilizing posterior approach, patient's right glenohumeral space was injected with 4:1  marcaine 0.5%: Kenalog 40mg /dL. Patient tolerated the procedure well without immediate  complications.    Impression and  Recommendations:     This case required medical decision making of moderate complexity.

## 2013-11-01 NOTE — Progress Notes (Signed)
Pre-visit discussion using our clinic review tool. No additional management support is needed unless otherwise documented below in the visit note.  

## 2013-11-01 NOTE — Assessment & Plan Note (Signed)
Patient did have injection today and is doing very well. Patient has home exercises and will start again in 48 hours 3 times a week. Discuss continuing the other conservative measures. Patient will come back in 3 weeks for further evaluation. Patient continues to trouble him he would consider further imaging.

## 2013-11-01 NOTE — Patient Instructions (Signed)
It is great to see you.  Two injections today Go back to the shoulder exercises only and do them 3 times a week.  Ice still can help after activity Meloxicam as you need it.  Come back again in 3 weeks.

## 2013-11-22 ENCOUNTER — Ambulatory Visit (INDEPENDENT_AMBULATORY_CARE_PROVIDER_SITE_OTHER): Payer: BC Managed Care – PPO | Admitting: Family Medicine

## 2013-11-22 ENCOUNTER — Ambulatory Visit: Payer: BC Managed Care – PPO | Admitting: Internal Medicine

## 2013-11-22 ENCOUNTER — Encounter: Payer: Self-pay | Admitting: Family Medicine

## 2013-11-22 VITALS — BP 126/74 | HR 83 | Temp 98.2°F | Resp 16 | Wt 149.0 lb

## 2013-11-22 DIAGNOSIS — M25519 Pain in unspecified shoulder: Secondary | ICD-10-CM

## 2013-11-22 DIAGNOSIS — M25512 Pain in left shoulder: Secondary | ICD-10-CM

## 2013-11-22 DIAGNOSIS — M6530 Trigger finger, unspecified finger: Secondary | ICD-10-CM

## 2013-11-22 NOTE — Assessment & Plan Note (Signed)
Patient did respond very well to intra-articular injection. Patient is doing everything right but will continue to do the exercises 2 times a week for the next 6 weeks. After that she can do everything as tolerated. Patient can follow up on an as-needed basis.

## 2013-11-22 NOTE — Assessment & Plan Note (Signed)
Patient still does have a nodule but it does seem to be improving. It was unable to make patient's finger trigger today. At this time I think we will continue to monitor. Patient will continue the home exercises and hopefully will continue to improve. Patient has any trouble in 4 weeks she'll come back for further evaluation.

## 2013-11-22 NOTE — Patient Instructions (Signed)
Good to see you! For your finger consider gloves at night and iron 325 mg daily.  Encapsulated fish oil.  For your shoulder exercises 2 times a week for another 4 weeks.  I will miss you terribly but only come back if you need me or if the finger acts up.

## 2013-11-22 NOTE — Progress Notes (Signed)
Pre-visit discussion using our clinic review tool. No additional management support is needed unless otherwise documented below in the visit note.  

## 2013-11-22 NOTE — Progress Notes (Signed)
  CC: Followup left shoulder pain  HPI: Patient is here for followup of her left shoulder pain. At last visit patient was given an injection in her left shoulder as well as her trigger finger of the right hand.  Regarding her left shoulder patient states that she is 99.9% better. Patient states that she is able to do all activities of daily living without any discomfort and only has a sharp pain that happens for one seconding goes away once again later with certain movements. Patient denies any radiation down the arm or any numbness. Patient is sleeping comfortably.  Regarding patient's trigger finger Patient states it is no longer painful but continues to trigger from time to time. Patient states that she does not need to use her other hand to make it get better but overall she continues to improve.  Past medical, surgical, family and social history reviewed. Medications reviewed all in the electronic medical record.   Review of Systems: No headache, visual changes, nausea, vomiting, diarrhea, constipation, dizziness, abdominal pain, skin rash, fevers, chills, night sweats, weight loss, swollen lymph nodes, body aches, joint swelling, muscle aches, chest pain, shortness of breath, mood changes.   Objective:    Blood pressure 126/74, pulse 83, temperature 98.2 F (36.8 C), temperature source Oral, resp. rate 16, weight 149 lb (67.586 kg), SpO2 93.00%.   General: No apparent distress alert and oriented x3 mood and affect normal, dressed appropriately.  HEENT: Pupils equal, extraocular movements intact Respiratory: Patient's speak in full sentences and does not appear short of breath Cardiovascular: No lower extremity edema, non tender, no erythema Skin: Warm dry intact with no signs of infection or rash on extremities or on axial skeleton. Abdomen: Soft nontender Neuro: Cranial nerves II through XII are intact, neurovascularly intact in all extremities with 2+ DTRs and 2+ pulses. Lymph: No  lymphadenopathy of posterior or anterior cervical chain or axillae bilaterally.  Gait normal with good balance and coordination.  MSK: Non tender with full range of motion and good stability and symmetric strength and tone of elbows, wrist, hip, knee and ankles bilaterally.  Shoulder: Left shoulder Inspection reveals no abnormalities, atrophy or asymmetry. Palpation is normal with no tenderness over AC joint or bicipital groove. ROM is full in all planes.  Rotator cuff strength normal throughout. Negative impingement sign No labral pathology noted with negative Obrien's, negative clunk and good stability. Normal scapular function observed. No painful arc and no drop arm sign. No apprehension sign Contralateral shoulder unremarkable Unable to think patient's finger trigger at this time.  .    Impression and Recommendations:     This case required medical decision making of moderate complexity.

## 2014-05-05 ENCOUNTER — Encounter: Payer: Self-pay | Admitting: Internal Medicine

## 2014-05-05 ENCOUNTER — Ambulatory Visit (INDEPENDENT_AMBULATORY_CARE_PROVIDER_SITE_OTHER): Payer: BC Managed Care – PPO | Admitting: Internal Medicine

## 2014-05-05 VITALS — BP 138/74 | HR 92 | Temp 98.8°F | Resp 16 | Ht 63.0 in | Wt 155.0 lb

## 2014-05-05 DIAGNOSIS — J189 Pneumonia, unspecified organism: Secondary | ICD-10-CM | POA: Insufficient documentation

## 2014-05-05 HISTORY — DX: Pneumonia, unspecified organism: J18.9

## 2014-05-05 MED ORDER — AZITHROMYCIN 500 MG PO TABS: 500.0000 mg | ORAL_TABLET | Freq: Every day | ORAL | Status: DC

## 2014-05-05 MED ORDER — HYDROCODONE-HOMATROPINE 5-1.5 MG/5ML PO SYRP: 5.0000 mL | ORAL_SOLUTION | Freq: Three times a day (TID) | ORAL | Status: DC | PRN

## 2014-05-05 NOTE — Progress Notes (Signed)
Pre visit review using our clinic review tool, if applicable. No additional management support is needed unless otherwise documented below in the visit note. 

## 2014-05-05 NOTE — Assessment & Plan Note (Signed)
I will treat the infection with zithromax and will control the cough with hycodan 

## 2014-05-05 NOTE — Progress Notes (Signed)
   Subjective:    Patient ID: Allison BameDonna L Wadhwa, female    DOB: Jun 08, 1951, 63 y.o.   MRN: [id number]  Cough This is a new problem. The current episode started in the past 7 days. The problem has been gradually worsening. The problem occurs every few hours. The cough is productive of purulent sputum. Associated symptoms include chills, a fever, a sore throat and sweats. Pertinent negatives include no chest pain, ear congestion, ear pain, headaches, heartburn, hemoptysis, myalgias, nasal congestion, postnasal drip, rash, rhinorrhea, shortness of breath, weight loss or wheezing. Nothing aggravates the symptoms. She has tried OTC cough suppressant for the symptoms. The treatment provided mild relief. There is no history of asthma, bronchiectasis, bronchitis, COPD, emphysema, environmental allergies or pneumonia.      Review of Systems  Constitutional: Positive for fever and chills. Negative for weight loss, diaphoresis, activity change, appetite change, fatigue and unexpected weight change.  HENT: Positive for sore throat. Negative for ear pain, postnasal drip, rhinorrhea, sinus pressure, sneezing, tinnitus, trouble swallowing and voice change.   Eyes: Negative.   Respiratory: Positive for cough. Negative for hemoptysis, choking, chest tightness, shortness of breath, wheezing and stridor.   Cardiovascular: Negative.  Negative for chest pain, palpitations and leg swelling.  Gastrointestinal: Negative.  Negative for heartburn and abdominal pain.  Endocrine: Negative.   Genitourinary: Negative.   Musculoskeletal: Negative.  Negative for myalgias.  Skin: Negative for rash.  Allergic/Immunologic: Negative.  Negative for environmental allergies.  Neurological: Negative.  Negative for dizziness, syncope, speech difficulty, weakness, light-headedness, numbness and headaches.  Hematological: Negative.  Negative for adenopathy. Does not bruise/bleed easily.  Psychiatric/Behavioral: Negative.          Objective:   Physical Exam  Vitals reviewed. Constitutional: She is oriented to person, place, and time. She appears well-developed and well-nourished.  Non-toxic appearance. She does not have a sickly appearance. She does not appear ill. No distress.  HENT:  Head: Normocephalic and atraumatic.  Mouth/Throat: Oropharynx is clear and moist. No oropharyngeal exudate.  Eyes: Conjunctivae are normal. Right eye exhibits no discharge. Left eye exhibits no discharge. No scleral icterus.  Neck: Normal range of motion. Neck supple. No JVD present. No tracheal deviation present. No thyromegaly present.  Cardiovascular: Normal rate, regular rhythm, normal heart sounds and intact distal pulses.  Exam reveals no gallop and no friction rub.   No murmur heard. Pulmonary/Chest: Effort normal. No accessory muscle usage or stridor. Not tachypneic. No respiratory distress. She has no decreased breath sounds. She has no wheezes. She has rhonchi in the right lower field. She has no rales. She exhibits no tenderness.  Abdominal: Soft. Bowel sounds are normal. She exhibits no distension and no mass. There is no tenderness. There is no rebound and no guarding.  Musculoskeletal: Normal range of motion. She exhibits no edema and no tenderness.  Lymphadenopathy:    She has no cervical adenopathy.  Neurological: She is oriented to person, place, and time.  Skin: Skin is warm and dry. No rash noted. She is not diaphoretic. No erythema. No pallor.          Assessment & Plan:

## 2014-05-05 NOTE — Patient Instructions (Signed)

## 2014-05-30 ENCOUNTER — Other Ambulatory Visit (INDEPENDENT_AMBULATORY_CARE_PROVIDER_SITE_OTHER): Payer: BC Managed Care – PPO

## 2014-05-30 ENCOUNTER — Ambulatory Visit (INDEPENDENT_AMBULATORY_CARE_PROVIDER_SITE_OTHER): Payer: BC Managed Care – PPO | Admitting: Internal Medicine

## 2014-05-30 ENCOUNTER — Encounter: Payer: Self-pay | Admitting: Internal Medicine

## 2014-05-30 VITALS — BP 142/77 | HR 76 | Temp 98.2°F | Resp 16 | Wt 157.0 lb

## 2014-05-30 DIAGNOSIS — L639 Alopecia areata, unspecified: Secondary | ICD-10-CM | POA: Insufficient documentation

## 2014-05-30 DIAGNOSIS — J189 Pneumonia, unspecified organism: Secondary | ICD-10-CM

## 2014-05-30 DIAGNOSIS — E538 Deficiency of other specified B group vitamins: Secondary | ICD-10-CM

## 2014-05-30 LAB — BASIC METABOLIC PANEL
BUN: 11 mg/dL (ref 6–23)
CHLORIDE: 102 meq/L (ref 96–112)
CO2: 29 meq/L (ref 19–32)
Calcium: 9.1 mg/dL (ref 8.4–10.5)
Creatinine, Ser: 0.8 mg/dL (ref 0.4–1.2)
GFR: 80.53 mL/min (ref >60.00)
GLUCOSE: 99 mg/dL (ref 70–99)
POTASSIUM: 3.9 meq/L (ref 3.5–5.1)
SODIUM: 139 meq/L (ref 135–145)

## 2014-05-30 LAB — SEDIMENTATION RATE: SED RATE: 33 mm/h — AB (ref 0–22)

## 2014-05-30 LAB — TSH: TSH: 0.66 u[IU]/mL (ref 0.35–4.50)

## 2014-05-30 LAB — T4, FREE: FREE T4: 0.88 ng/dL (ref 0.60–1.60)

## 2014-05-30 MED ORDER — CLOBETASOL PROPIONATE 0.05 % EX SOLN: 1.0000 "application " | Freq: Two times a day (BID) | CUTANEOUS | Status: DC

## 2014-05-30 NOTE — Progress Notes (Signed)
Pre visit review using our clinic review tool, if applicable. No additional management support is needed unless otherwise documented below in the visit note. 

## 2014-05-30 NOTE — Assessment & Plan Note (Signed)
Biotin B complex Clobetazol sol Derm ref offered

## 2014-05-30 NOTE — Assessment & Plan Note (Signed)
A little cough CXR if not better

## 2014-05-30 NOTE — Progress Notes (Signed)
   HPI  F/u R PNA x 1 mo. She took a Z pac. No CXR wa done  F/u B12 def and HTN   She has lost wt on diet.   Wt Readings from Last 3 Encounters:  05/30/14 157 lb (71.215 kg)  05/05/14 155 lb (70.308 kg)  11/22/13 149 lb (67.586 kg)   BP Readings from Last 3 Encounters:  05/30/14 142/77  05/05/14 138/74  11/22/13 126/74       Review of Systems  Constitutional: Negative for fever, chills, diaphoresis, activity change, appetite change, fatigue and unexpected weight change.  HENT: Negative for congestion, ear pain, facial swelling, hearing loss, mouth sores, nosebleeds, postnasal drip, rhinorrhea, sinus pressure, sneezing, sore throat, tinnitus and trouble swallowing.   Eyes: Negative for pain, discharge, redness, itching and visual disturbance.  Respiratory: Negative for cough, chest tightness, shortness of breath, wheezing and stridor.   Cardiovascular: Negative for chest pain, palpitations and leg swelling.  Gastrointestinal: Negative for nausea, diarrhea, constipation, blood in stool, abdominal distention, anal bleeding and rectal pain.  Genitourinary: Negative for dysuria, urgency, frequency, hematuria, flank pain, vaginal bleeding, vaginal discharge, difficulty urinating, genital sores and pelvic pain.  Musculoskeletal: Negative for arthralgias, back pain, gait problem, joint swelling, neck pain and neck stiffness.  Skin: Negative.  Negative for rash.  Neurological: Negative for dizziness, tremors, seizures, syncope, speech difficulty, weakness, numbness and headaches.  Hematological: Negative for adenopathy. Does not bruise/bleed easily.  Psychiatric/Behavioral: Negative for suicidal ideas, behavioral problems, sleep disturbance, dysphoric mood and decreased concentration. The patient is not nervous/anxious.         Objective:   Physical Exam  Constitutional: She appears well-developed and well-nourished. No distress.  HENT:  Head: Normocephalic.  Right Ear: External  ear normal.  Left Ear: External ear normal.  Nose: Nose normal.  Mouth/Throat: Oropharynx is clear and moist.  Eyes: Conjunctivae are normal. Pupils are equal, round, and reactive to light. Right eye exhibits no discharge. Left eye exhibits no discharge.  Neck: Normal range of motion. Neck supple. No JVD present. No tracheal deviation present. No thyromegaly present.  Cardiovascular: Normal rate, regular rhythm and normal heart sounds.   Pulmonary/Chest: No stridor. No respiratory distress. She has no wheezes.  Abdominal: Soft. Bowel sounds are normal. She exhibits no distension and no mass. There is no tenderness. There is no rebound and no guarding.  Musculoskeletal: She exhibits no edema and no tenderness.  Lymphadenopathy:    She has no cervical adenopathy.  Neurological: She displays normal reflexes. No cranial nerve deficit. She exhibits normal muscle tone. Coordination normal.  Skin: No rash noted. No erythema.  Psychiatric: She has a normal mood and affect. Her behavior is normal. Judgment and thought content normal.  Much better looking new nail. No rash   Lab Results  Component Value Date   WBC 6.7 08/02/2013   HGB 13.4 08/02/2013   HCT 39.2 08/02/2013   PLT 265.0 08/02/2013   GLUCOSE 99 08/02/2013   CHOL 188 08/02/2013   TRIG 68.0 08/02/2013   HDL 53.00 08/02/2013   LDLDIRECT 132.3 01/28/2009   LDLCALC 121* 08/02/2013   ALT 16 08/02/2013   AST 23 08/02/2013   NA 142 08/02/2013   K 4.8 08/02/2013   CL 105 08/02/2013   CREATININE 0.7 08/02/2013   BUN 14 08/02/2013   CO2 30 08/02/2013   TSH 0.88 08/02/2013             Assessment & Plan:

## 2014-06-02 LAB — ANA: ANA: NEGATIVE

## 2014-06-04 NOTE — Assessment & Plan Note (Signed)
Continue with current prescription therapy as reflected on the Med list.  

## 2014-06-12 ENCOUNTER — Ambulatory Visit (INDEPENDENT_AMBULATORY_CARE_PROVIDER_SITE_OTHER)
Admission: RE | Admit: 2014-06-12 | Discharge: 2014-06-12 | Disposition: A | Discharge status: Home / Self Care | Payer: BC Managed Care – PPO | Source: Ambulatory Visit | Attending: Internal Medicine | Admitting: Internal Medicine

## 2014-06-12 DIAGNOSIS — J189 Pneumonia, unspecified organism: Secondary | ICD-10-CM

## 2014-07-30 ENCOUNTER — Other Ambulatory Visit (INDEPENDENT_AMBULATORY_CARE_PROVIDER_SITE_OTHER): Payer: BC Managed Care – PPO

## 2014-07-30 ENCOUNTER — Other Ambulatory Visit: Payer: Self-pay | Admitting: *Deleted

## 2014-07-30 DIAGNOSIS — Z0000 Encounter for general adult medical examination without abnormal findings: Secondary | ICD-10-CM

## 2014-07-30 LAB — LIPID PANEL
CHOLESTEROL: 210 mg/dL — AB (ref 0–200)
HDL: 56.1 mg/dL (ref >39.00)
LDL Cholesterol: 136 mg/dL — ABNORMAL HIGH (ref 0–99)
NonHDL: 153.9
TRIGLYCERIDES: 90 mg/dL (ref 0.0–149.0)
Total CHOL/HDL Ratio: 4
VLDL: 18 mg/dL (ref 0.0–40.0)

## 2014-07-30 LAB — CBC WITH DIFFERENTIAL/PLATELET
BASOS ABS: 0 10*3/uL (ref 0.0–0.1)
Basophils Relative: 0.6 % (ref 0.0–3.0)
EOS ABS: 0.2 10*3/uL (ref 0.0–0.7)
Eosinophils Relative: 2.4 % (ref 0.0–5.0)
HCT: 41.5 % (ref 36.0–46.0)
HEMOGLOBIN: 13.5 g/dL (ref 12.0–15.0)
LYMPHS PCT: 27.7 % (ref 12.0–46.0)
Lymphs Abs: 1.7 10*3/uL (ref 0.7–4.0)
MCHC: 32.6 g/dL (ref 30.0–36.0)
MCV: 93.3 fl (ref 78.0–100.0)
MONOS PCT: 6.9 % (ref 3.0–12.0)
Monocytes Absolute: 0.4 10*3/uL (ref 0.1–1.0)
NEUTROS ABS: 3.9 10*3/uL (ref 1.4–7.7)
NEUTROS PCT: 62.4 % (ref 43.0–77.0)
PLATELETS: 269 10*3/uL (ref 150.0–400.0)
RBC: 4.45 Mil/uL (ref 3.87–5.11)
RDW: 13.7 % (ref 11.5–15.5)
WBC: 6.3 10*3/uL (ref 4.0–10.5)

## 2014-07-30 LAB — URINALYSIS, ROUTINE W REFLEX MICROSCOPIC
Bilirubin Urine: NEGATIVE
HGB URINE DIPSTICK: NEGATIVE
Ketones, ur: NEGATIVE
LEUKOCYTES UA: NEGATIVE
Nitrite: NEGATIVE
RBC / HPF: NONE SEEN (ref 0–?)
Specific Gravity, Urine: <=1.005 — AB (ref 1.000–1.030)
Total Protein, Urine: NEGATIVE
URINE GLUCOSE: NEGATIVE
Urobilinogen, UA: 0.2 (ref 0.0–1.0)
WBC UA: NONE SEEN (ref 0–?)
pH: 5.5 (ref 5.0–8.0)

## 2014-07-30 LAB — BASIC METABOLIC PANEL
BUN: 14 mg/dL (ref 6–23)
CALCIUM: 9.6 mg/dL (ref 8.4–10.5)
CHLORIDE: 104 meq/L (ref 96–112)
CO2: 24 meq/L (ref 19–32)
Creatinine, Ser: 0.8 mg/dL (ref 0.4–1.2)
GFR: 81.71 mL/min (ref >60.00)
Glucose, Bld: 87 mg/dL (ref 70–99)
Potassium: 4 mEq/L (ref 3.5–5.1)
Sodium: 139 mEq/L (ref 135–145)

## 2014-07-30 LAB — HEPATIC FUNCTION PANEL
ALT: 15 U/L (ref 0–35)
AST: 21 U/L (ref 0–37)
Albumin: 3.6 g/dL (ref 3.5–5.2)
Alkaline Phosphatase: 62 U/L (ref 39–117)
BILIRUBIN DIRECT: 0.1 mg/dL (ref 0.0–0.3)
TOTAL PROTEIN: 7.8 g/dL (ref 6.0–8.3)
Total Bilirubin: 0.4 mg/dL (ref 0.2–1.2)

## 2014-07-30 LAB — TSH: TSH: 0.7 u[IU]/mL (ref 0.35–4.50)

## 2014-08-04 ENCOUNTER — Encounter: Payer: Self-pay | Admitting: Internal Medicine

## 2014-08-04 ENCOUNTER — Ambulatory Visit (INDEPENDENT_AMBULATORY_CARE_PROVIDER_SITE_OTHER): Payer: BC Managed Care – PPO | Admitting: Internal Medicine

## 2014-08-04 VITALS — BP 134/80 | HR 83 | Temp 98.6°F | Ht 63.5 in | Wt 155.0 lb

## 2014-08-04 DIAGNOSIS — E785 Hyperlipidemia, unspecified: Secondary | ICD-10-CM

## 2014-08-04 DIAGNOSIS — Z23 Encounter for immunization: Secondary | ICD-10-CM

## 2014-08-04 DIAGNOSIS — F329 Major depressive disorder, single episode, unspecified: Secondary | ICD-10-CM

## 2014-08-04 DIAGNOSIS — F32A Depression, unspecified: Secondary | ICD-10-CM

## 2014-08-04 DIAGNOSIS — Z0000 Encounter for general adult medical examination without abnormal findings: Secondary | ICD-10-CM

## 2014-08-04 NOTE — Assessment & Plan Note (Signed)
Mild Pt declined statins

## 2014-08-04 NOTE — Assessment & Plan Note (Signed)
We discussed age appropriate health related issues, including available/recomended screening tests and vaccinations. We discussed a need for adhering to healthy diet and exercise. Labs/EKG were reviewed/ordered. All questions were answered. EKG - no change from last year

## 2014-08-04 NOTE — Assessment & Plan Note (Signed)
Continue with current prescription therapy as reflected on the Med list.  

## 2014-08-04 NOTE — Progress Notes (Deleted)
Pre visit review using our clinic review tool, if applicable. No additional management support is needed unless otherwise documented below in the visit note. 

## 2014-08-04 NOTE — Progress Notes (Signed)
Subjective:    HPI  The patient is here for a wellness exam. The patient has been doing well overall . She has been having pain in the L shoulder x 6-8 wks; worse w/ROM  She has lost wt on diet.   Wt Readings from Last 3 Encounters:  08/04/14 155 lb (70.308 kg)  05/30/14 157 lb (71.215 kg)  05/05/14 155 lb (70.308 kg)   BP Readings from Last 3 Encounters:  08/04/14 134/80  05/30/14 142/77  05/05/14 138/74       Review of Systems  Constitutional: Negative for fever, chills, diaphoresis, activity change, appetite change, fatigue and unexpected weight change.  HENT: Negative for congestion, ear pain, facial swelling, hearing loss, mouth sores, nosebleeds, postnasal drip, rhinorrhea, sinus pressure, sneezing, sore throat, tinnitus and trouble swallowing.   Eyes: Negative for pain, discharge, redness, itching and visual disturbance.  Respiratory: Negative for cough, chest tightness, shortness of breath, wheezing and stridor.   Cardiovascular: Negative for chest pain, palpitations and leg swelling.  Gastrointestinal: Negative for nausea, diarrhea, constipation, blood in stool, abdominal distention, anal bleeding and rectal pain.  Genitourinary: Negative for dysuria, urgency, frequency, hematuria, flank pain, vaginal bleeding, vaginal discharge, difficulty urinating, genital sores and pelvic pain.  Musculoskeletal: Negative for arthralgias, back pain, gait problem, joint swelling, neck pain and neck stiffness.  Skin: Negative.  Negative for rash.  Neurological: Negative for dizziness, tremors, seizures, syncope, speech difficulty, weakness, numbness and headaches.  Hematological: Negative for adenopathy. Does not bruise/bleed easily.  Psychiatric/Behavioral: Negative for suicidal ideas, behavioral problems, sleep disturbance, dysphoric mood and decreased concentration. The patient is not nervous/anxious.         Objective:   Physical Exam  Constitutional: She appears  well-developed and well-nourished. No distress.  HENT:  Head: Normocephalic.  Right Ear: External ear normal.  Left Ear: External ear normal.  Nose: Nose normal.  Mouth/Throat: Oropharynx is clear and moist.  Eyes: Conjunctivae are normal. Pupils are equal, round, and reactive to light. Right eye exhibits no discharge. Left eye exhibits no discharge.  Neck: Normal range of motion. Neck supple. No JVD present. No tracheal deviation present. No thyromegaly present.  Cardiovascular: Normal rate, regular rhythm and normal heart sounds.   Pulmonary/Chest: No stridor. No respiratory distress. She has no wheezes.  Abdominal: Soft. Bowel sounds are normal. She exhibits no distension and no mass. There is no tenderness. There is no rebound and no guarding.  Musculoskeletal: She exhibits no edema and no tenderness.  Lymphadenopathy:    She has no cervical adenopathy.  Neurological: She displays normal reflexes. No cranial nerve deficit. She exhibits normal muscle tone. Coordination normal.  Skin: No rash noted. No erythema.  Psychiatric: She has a normal mood and affect. Her behavior is normal. Judgment and thought content normal.  R houlder is tender posteriorly  Lab Results  Component Value Date   WBC 6.3 07/30/2014   HGB 13.5 07/30/2014   HCT 41.5 07/30/2014   PLT 269.0 07/30/2014   GLUCOSE 87 07/30/2014   CHOL 210* 07/30/2014   TRIG 90.0 07/30/2014   HDL 56.10 07/30/2014   LDLDIRECT 132.3 01/28/2009   LDLCALC 136* 07/30/2014   ALT 15 07/30/2014   AST 21 07/30/2014   NA 139 07/30/2014   K 4.0 07/30/2014   CL 104 07/30/2014   CREATININE 0.8 07/30/2014   BUN 14 07/30/2014   CO2 24 07/30/2014   TSH 0.70 07/30/2014          Assessment & Plan:

## 2014-08-22 ENCOUNTER — Telehealth: Payer: Self-pay | Admitting: Internal Medicine

## 2014-08-22 MED ORDER — CYCLOBENZAPRINE HCL 5 MG PO TABS: 5.0000 mg | ORAL_TABLET | Freq: Two times a day (BID) | ORAL | Status: DC | PRN

## 2014-08-22 NOTE — Telephone Encounter (Signed)
Called pt no answer [facility]OM md sent to [facility].../lmb

## 2014-08-22 NOTE — Telephone Encounter (Signed)
Ok done Thx 

## 2014-08-22 NOTE — Telephone Encounter (Signed)
Notified pt. 

## 2014-08-22 NOTE — Telephone Encounter (Signed)
Pt having back pain and muscle spasms, ? If she could be called in a muscle relaxer. Dr Macario GoldsPlot sched packed.

## 2014-11-04 ENCOUNTER — Other Ambulatory Visit: Payer: Self-pay

## 2014-11-04 MED ORDER — CITALOPRAM HYDROBROMIDE 20 MG PO TABS: ORAL_TABLET | ORAL | Status: DC

## 2014-12-20 ENCOUNTER — Other Ambulatory Visit: Payer: Self-pay | Admitting: Internal Medicine

## 2015-02-09 ENCOUNTER — Ambulatory Visit (INDEPENDENT_AMBULATORY_CARE_PROVIDER_SITE_OTHER): Payer: BC Managed Care – PPO | Admitting: Internal Medicine

## 2015-02-09 ENCOUNTER — Encounter: Payer: Self-pay | Admitting: Internal Medicine

## 2015-02-09 VITALS — BP 130/90 | HR 82 | Wt 166.0 lb

## 2015-02-09 DIAGNOSIS — Z0000 Encounter for general adult medical examination without abnormal findings: Secondary | ICD-10-CM | POA: Diagnosis not present

## 2015-02-09 DIAGNOSIS — E538 Deficiency of other specified B group vitamins: Secondary | ICD-10-CM

## 2015-02-09 NOTE — Progress Notes (Signed)
Subjective:    HPI  The patient is here for a wellness exam. The patient has been doing well overall . She has been having pain in the L shoulder x 6-8 wks; worse w/ROM.  She has lost wt on diet.   Wt Readings from Last 3 Encounters:  02/09/15 166 lb (75.297 kg)  08/04/14 155 lb (70.308 kg)  05/30/14 157 lb (71.215 kg)   BP Readings from Last 3 Encounters:  02/09/15 130/90  08/04/14 134/80  05/30/14 142/77       Review of Systems  Constitutional: Negative for fever, chills, diaphoresis, activity change, appetite change, fatigue and unexpected weight change.  HENT: Negative for congestion, ear pain, facial swelling, hearing loss, mouth sores, nosebleeds, postnasal drip, rhinorrhea, sinus pressure, sneezing, sore throat, tinnitus and trouble swallowing.   Eyes: Negative for pain, discharge, redness, itching and visual disturbance.  Respiratory: Negative for cough, chest tightness, shortness of breath, wheezing and stridor.   Cardiovascular: Negative for chest pain, palpitations and leg swelling.  Gastrointestinal: Negative for nausea, diarrhea, constipation, blood in stool, abdominal distention, anal bleeding and rectal pain.  Genitourinary: Negative for dysuria, urgency, frequency, hematuria, flank pain, vaginal bleeding, vaginal discharge, difficulty urinating, genital sores and pelvic pain.  Musculoskeletal: Negative for back pain, joint swelling, arthralgias, gait problem, neck pain and neck stiffness.  Skin: Negative.  Negative for rash.  Neurological: Negative for dizziness, tremors, seizures, syncope, speech difficulty, weakness, numbness and headaches.  Hematological: Negative for adenopathy. Does not bruise/bleed easily.  Psychiatric/Behavioral: Negative for suicidal ideas, behavioral problems, sleep disturbance, dysphoric mood and decreased concentration. The patient is not nervous/anxious.         Objective:   Physical Exam  Constitutional: She appears  well-developed and well-nourished. No distress.  HENT:  Head: Normocephalic.  Right Ear: External ear normal.  Left Ear: External ear normal.  Nose: Nose normal.  Mouth/Throat: Oropharynx is clear and moist.  Eyes: Conjunctivae are normal. Pupils are equal, round, and reactive to light. Right eye exhibits no discharge. Left eye exhibits no discharge.  Neck: Normal range of motion. Neck supple. No JVD present. No tracheal deviation present. No thyromegaly present.  Cardiovascular: Normal rate, regular rhythm and normal heart sounds.   Pulmonary/Chest: No stridor. No respiratory distress. She has no wheezes.  Abdominal: Soft. Bowel sounds are normal. She exhibits no distension and no mass. There is no tenderness. There is no rebound and no guarding.  Musculoskeletal: She exhibits no edema or tenderness.  Lymphadenopathy:    She has no cervical adenopathy.  Neurological: She displays normal reflexes. No cranial nerve deficit. She exhibits normal muscle tone. Coordination normal.  Skin: No rash noted. No erythema.  Psychiatric: She has a normal mood and affect. Her behavior is normal. Judgment and thought content normal.  R shoulder is tender posteriorly  Lab Results  Component Value Date   WBC 6.3 07/30/2014   HGB 13.5 07/30/2014   HCT 41.5 07/30/2014   PLT 269.0 07/30/2014   GLUCOSE 87 07/30/2014   CHOL 210* 07/30/2014   TRIG 90.0 07/30/2014   HDL 56.10 07/30/2014   LDLDIRECT 132.3 01/28/2009   LDLCALC 136* 07/30/2014   ALT 15 07/30/2014   AST 21 07/30/2014   NA 139 07/30/2014   K 4.0 07/30/2014   CL 104 07/30/2014   CREATININE 0.8 07/30/2014   BUN 14 07/30/2014   CO2 24 07/30/2014   TSH 0.70 07/30/2014          Assessment & Plan:

## 2015-02-09 NOTE — Patient Instructions (Signed)
Tylenol PM; Valerian root

## 2015-02-09 NOTE — Progress Notes (Signed)
Pre visit review using our clinic review tool, if applicable. No additional management support is needed unless otherwise documented below in the visit note. 

## 2015-05-29 LAB — HM MAMMOGRAPHY: HM Mammogram: NORMAL

## 2015-06-01 ENCOUNTER — Encounter: Payer: Self-pay | Admitting: Internal Medicine

## 2015-07-13 ENCOUNTER — Ambulatory Visit (INDEPENDENT_AMBULATORY_CARE_PROVIDER_SITE_OTHER): Payer: BC Managed Care – PPO | Admitting: Internal Medicine

## 2015-07-13 ENCOUNTER — Encounter: Payer: Self-pay | Admitting: Internal Medicine

## 2015-07-13 VITALS — BP 130/68 | HR 83 | Temp 98.5°F | Resp 16 | Wt 169.0 lb

## 2015-07-13 DIAGNOSIS — R05 Cough: Secondary | ICD-10-CM | POA: Diagnosis not present

## 2015-07-13 DIAGNOSIS — K219 Gastro-esophageal reflux disease without esophagitis: Secondary | ICD-10-CM | POA: Diagnosis not present

## 2015-07-13 DIAGNOSIS — R059 Cough, unspecified: Secondary | ICD-10-CM

## 2015-07-13 DIAGNOSIS — J310 Chronic rhinitis: Secondary | ICD-10-CM | POA: Diagnosis not present

## 2015-07-13 MED ORDER — AMOXICILLIN 500 MG PO CAPS: 500.0000 mg | ORAL_CAPSULE | Freq: Three times a day (TID) | ORAL | Status: DC

## 2015-07-13 NOTE — Progress Notes (Signed)
   Subjective:    Patient ID: [name], female    DOB: 06-15-1951, 64 y.o.   MRN: [id number]  HPI  The last 3 weeks she's had a loose but nonproductive cough with "mucus in my throat". She's been taking Benadryl  & cough syrup with minor relief.. She believes there is a component of postnasal drainage. She has reflux but denies any active reflux symptoms. She is not on an ACE inhibitor. She's never smoked and has no history of asthma.  She has no other upper respiratory tract or extrinsic symptoms.   Review of Systems Frontal headache, facial pain , nasal purulence, dental pain, sore throat , otic pain or otic discharge denied. No fever , chills or sweats. Extrinsic symptoms of itchy, watery eyes, sneezing, or angioedema are denied. There is no significant sputum production, wheezing,or  paroxysmal nocturnal dyspnea.     Objective:   Physical Exam  General appearance:Adequately nourished; no acute distress or increased work of breathing is present.    Lymphatic: No  lymphadenopathy about the head, neck, or axilla .  Eyes: No conjunctival inflammation or lid edema is present. There is no scleral icterus.  Ears:  External ear exam shows no significant lesions or deformities.  Otoscopic examination reveals clear canals, tympanic membranes are intact bilaterally without bulging, retraction, inflammation or discharge.  Nose:  External nasal examination shows no deformity or inflammation. Nasal mucosa are erythematous without lesions or exudates No septal dislocation or deviation.No obstruction to airflow.   Oral exam: Dental hygiene is good; lips and gums are healthy appearing.There is no oropharyngeal erythema or exudate .  Neck:  No deformities, thyromegaly, masses, or tenderness noted.   Supple with full range of motion without pain.   Heart:  Normal rate and regular rhythm. S1 and S2 normal without gallop, murmur, click, rub or other extra sounds.   Lungs:Chest clear to  auscultation; no wheezes, rhonchi,rales ,or rubs present.  Extremities:  No cyanosis, edema, or clubbing  noted    Skin: Warm & dry w/o tenting or jaundice. No significant lesions or rash.        Assessment & Plan:  #1 nonallergic rhinitis  #2 reflux, clinically quiescent  See after visit summary

## 2015-07-13 NOTE — Patient Instructions (Signed)
Plain Mucinex (NOT D) for thick secretions ;force NON dairy fluids .   Nasal cleansing in the shower as discussed with lather of mild shampoo.After 10 seconds wash off lather while  exhaling through nostrils. Make sure that all residual soap is removed to prevent irritation.  Flonase OR Nasacort AQ 1 spray in each nostril twice a day as needed. Use the "crossover" technique into opposite nostril spraying toward opposite ear @ 45 degree angle, not straight up into nostril.  Plain Allegra (NOT D )  160 daily , Loratidine 10 mg , OR Zyrtec 10 mg @ bedtime  as needed for itchy eyes & sneezing.  Fill the  prescription for antibiotic if fever, discolored nasal or chest secretions or significant pain above & below eyes appear in the next 48-72 hours.   Reflux of gastric acid may be asymptomatic as this may occur mainly during sleep.The triggers for reflux  include stress; the "aspirin family" ; alcohol; peppermint; and caffeine (coffee, tea, cola, and chocolate). The aspirin family would include aspirin and the nonsteroidal agents such as ibuprofen &  Naproxen. Tylenol would not cause reflux. If having symptoms ; food & drink should be avoided for @ least 2 hours before going to bed.

## 2015-07-13 NOTE — Progress Notes (Signed)
Pre visit review using our clinic review tool, if applicable. No additional management support is needed unless otherwise documented below in the visit note. 

## 2015-07-20 ENCOUNTER — Ambulatory Visit (INDEPENDENT_AMBULATORY_CARE_PROVIDER_SITE_OTHER): Payer: BC Managed Care – PPO | Admitting: Internal Medicine

## 2015-07-20 ENCOUNTER — Encounter: Payer: Self-pay | Admitting: Internal Medicine

## 2015-07-20 ENCOUNTER — Ambulatory Visit (INDEPENDENT_AMBULATORY_CARE_PROVIDER_SITE_OTHER)
Admission: RE | Admit: 2015-07-20 | Discharge: 2015-07-20 | Disposition: A | Discharge status: Home / Self Care | Payer: BC Managed Care – PPO | Source: Ambulatory Visit | Attending: Internal Medicine | Admitting: Internal Medicine

## 2015-07-20 VITALS — BP 136/80 | HR 99 | Temp 98.7°F | Wt 167.0 lb

## 2015-07-20 DIAGNOSIS — I10 Essential (primary) hypertension: Secondary | ICD-10-CM | POA: Diagnosis not present

## 2015-07-20 DIAGNOSIS — J189 Pneumonia, unspecified organism: Secondary | ICD-10-CM

## 2015-07-20 MED ORDER — LEVOFLOXACIN 500 MG PO TABS: 500.0000 mg | ORAL_TABLET | Freq: Every day | ORAL | Status: DC

## 2015-07-20 MED ORDER — PROMETHAZINE-CODEINE 6.25-10 MG/5ML PO SYRP: 5.0000 mL | ORAL_SOLUTION | ORAL | Status: DC | PRN

## 2015-07-20 NOTE — Progress Notes (Signed)
Subjective:  Patient ID: [name], female    DOB: June 03, 1951  Age: 64 y.o. MRN: [id number]  CC: Cough   HPI [name] presents for 2-3 week. Worse during night. Not much better. C/o R CP. SOB a little  Outpatient Prescriptions Prior to Visit  Medication Sig Dispense Refill  . Acetaminophen (TYLENOL ARTHRITIS PAIN PO) Take by mouth as needed.    Marland Kitchen aspirin 81 MG tablet Take 81 mg by mouth daily.      Marland Kitchen BIOTIN PO Take by mouth daily.    . Cholecalciferol 1000 UNITS capsule Take 1,000 Units by mouth daily.    . citalopram (CELEXA) 20 MG tablet TAKE 1 TABLET BY MOUTH EVERY DAY 30 tablet 11  . cyanocobalamin (,VITAMIN B-12,) 1000 MCG/ML injection USE AS DIRECTED EVERY 2 WEEKS 20 mL 2  . IRON PO Take by mouth daily.    . Ranitidine HCl (ZANTAC PO) Take by mouth as needed.      . clobetasol (TEMOVATE) 0.05 % external solution Apply 1 application topically 2 (two) times daily. Use qd on scalp as directed (Patient not taking: Reported on 07/20/2015) 50 mL 3  . hyoscyamine (LEVBID) 0.375 MG 12 hr tablet Take 1 tablet (0.375 mg total) by mouth every 12 (twelve) hours as needed for cramping. 60 tablet 3  . amoxicillin (AMOXIL) 500 MG capsule Take 1 capsule (500 mg total) by mouth 3 (three) times daily. (Patient not taking: Reported on 07/20/2015) 21 capsule 0   No facility-administered medications prior to visit.    ROS Review of Systems  Constitutional: Negative for chills, activity change, appetite change, fatigue and unexpected weight change.  HENT: Negative for congestion, mouth sores and sinus pressure.   Eyes: Negative for visual disturbance.  Respiratory: Positive for cough and shortness of breath. Negative for chest tightness.   Gastrointestinal: Negative for nausea and abdominal pain.  Genitourinary: Negative for frequency, difficulty urinating and vaginal pain.  Musculoskeletal: Negative for back pain and gait problem.  Skin: Negative for pallor and rash.    Neurological: Negative for dizziness, tremors, weakness, numbness and headaches.  Psychiatric/Behavioral: Negative for suicidal ideas, confusion and sleep disturbance.    Objective:  BP 136/80 mmHg  Pulse 99  Temp(Src) 98.7 F (37.1 C) (Oral)  Wt 167 lb (75.751 kg)  SpO2 98%  BP Readings from Last 3 Encounters:  07/20/15 136/80  07/13/15 130/68  02/09/15 130/90    Wt Readings from Last 3 Encounters:  07/20/15 167 lb (75.751 kg)  07/13/15 169 lb (76.658 kg)  02/09/15 166 lb (75.297 kg)    Physical Exam  Constitutional: She appears well-developed. No distress.  HENT:  Head: Normocephalic.  Right Ear: External ear normal.  Left Ear: External ear normal.  Nose: Nose normal.  Mouth/Throat: Oropharynx is clear and moist.  Eyes: Conjunctivae are normal. Pupils are equal, round, and reactive to light. Right eye exhibits no discharge. Left eye exhibits no discharge.  Neck: Normal range of motion. Neck supple. No JVD present. No tracheal deviation present. No thyromegaly present.  Cardiovascular: Normal rate, regular rhythm and normal heart sounds.   Pulmonary/Chest: No stridor. No respiratory distress. She has no wheezes. She has rales.  Abdominal: Soft. Bowel sounds are normal. She exhibits no distension and no mass. There is no tenderness. There is no rebound and no guarding.  Musculoskeletal: She exhibits no edema or tenderness.  Lymphadenopathy:    She has no cervical adenopathy.  Neurological: She displays normal reflexes. No cranial nerve  deficit. She exhibits normal muscle tone. Coordination normal.  Skin: No rash noted. No erythema.  Psychiatric: She has a normal mood and affect. Her behavior is normal. Judgment and thought content normal.  R post chest rales  Lab Results  Component Value Date   WBC 6.3 07/30/2014   HGB 13.5 07/30/2014   HCT 41.5 07/30/2014   PLT 269.0 07/30/2014   GLUCOSE 87 07/30/2014   CHOL 210* 07/30/2014   TRIG 90.0 07/30/2014   HDL 56.10  07/30/2014   LDLDIRECT 132.3 01/28/2009   LDLCALC 136* 07/30/2014   ALT 15 07/30/2014   AST 21 07/30/2014   NA 139 07/30/2014   K 4.0 07/30/2014   CL 104 07/30/2014   CREATININE 0.8 07/30/2014   BUN 14 07/30/2014   CO2 24 07/30/2014   TSH 0.70 07/30/2014    Dg Chest 2 View  06/12/2014   CLINICAL DATA:  Right pneumonia, cough.  EXAM: CHEST  2 VIEW  COMPARISON:  None.  FINDINGS: Lungs are adequately inflated without consolidation or effusion. The cardiomediastinal silhouette, bones and soft tissues are within normal.  IMPRESSION: No active cardiopulmonary disease.   Electronically Signed   By: Elberta Fortis M.D.   On: 06/12/2014 08:42    Assessment & Plan:   There are no diagnoses linked to this encounter. I have discontinued Ms. Mcgrory amoxicillin. I am also having her maintain her aspirin, Ranitidine HCl (ZANTAC PO), hyoscyamine, clobetasol, Cholecalciferol, IRON PO, Acetaminophen (TYLENOL ARTHRITIS PAIN PO), BIOTIN PO, citalopram, and cyanocobalamin.  No orders of the defined types were placed in this encounter.     Follow-up: No Follow-up on file.  Sonda Primes, MD

## 2015-07-20 NOTE — Assessment & Plan Note (Signed)
9/16 R - recurrent - r/o bronchiectases CXR May need a chest CT Prom-cod Levaquin

## 2015-07-20 NOTE — Assessment & Plan Note (Signed)
BP Readings from Last 3 Encounters:  07/20/15 136/80  07/13/15 130/68  02/09/15 130/90

## 2015-07-20 NOTE — Progress Notes (Signed)
Pre visit review using our clinic review tool, if applicable. No additional management support is needed unless otherwise documented below in the visit note. 

## 2015-07-21 ENCOUNTER — Telehealth: Payer: Self-pay | Admitting: *Deleted

## 2015-07-21 DIAGNOSIS — J181 Lobar pneumonia, unspecified organism: Principal | ICD-10-CM

## 2015-07-21 DIAGNOSIS — J189 Pneumonia, unspecified organism: Secondary | ICD-10-CM

## 2015-07-21 NOTE — Telephone Encounter (Signed)
Pt called after reading MyChart message about 07/20/15 CXR. Please order Chest CT.

## 2015-07-22 NOTE — Telephone Encounter (Signed)
Patient is requesting call at home phone once CT is scheduled

## 2015-07-22 NOTE — Telephone Encounter (Signed)
Ok Thx 

## 2015-07-23 NOTE — Telephone Encounter (Signed)
Received authorization from [facility]. Left msg for [facility] dept to schedule pt. Awaiting return call. See referral notes.

## 2015-07-27 ENCOUNTER — Ambulatory Visit (INDEPENDENT_AMBULATORY_CARE_PROVIDER_SITE_OTHER)
Admission: RE | Admit: 2015-07-27 | Discharge: 2015-07-27 | Disposition: A | Discharge status: Home / Self Care | Payer: BC Managed Care – PPO | Source: Ambulatory Visit | Attending: Internal Medicine | Admitting: Internal Medicine

## 2015-07-27 DIAGNOSIS — J189 Pneumonia, unspecified organism: Secondary | ICD-10-CM

## 2015-07-27 DIAGNOSIS — J181 Lobar pneumonia, unspecified organism: Principal | ICD-10-CM

## 2015-08-07 ENCOUNTER — Other Ambulatory Visit (INDEPENDENT_AMBULATORY_CARE_PROVIDER_SITE_OTHER): Payer: BC Managed Care – PPO

## 2015-08-07 DIAGNOSIS — Z0000 Encounter for general adult medical examination without abnormal findings: Secondary | ICD-10-CM

## 2015-08-07 DIAGNOSIS — E538 Deficiency of other specified B group vitamins: Secondary | ICD-10-CM

## 2015-08-07 LAB — CBC WITH DIFFERENTIAL/PLATELET
BASOS ABS: 0 10*3/uL (ref 0.0–0.1)
Basophils Relative: 0.5 % (ref 0.0–3.0)
EOS PCT: 2.3 % (ref 0.0–5.0)
Eosinophils Absolute: 0.2 10*3/uL (ref 0.0–0.7)
HCT: 40.8 % (ref 36.0–46.0)
Hemoglobin: 13.7 g/dL (ref 12.0–15.0)
LYMPHS ABS: 1.8 10*3/uL (ref 0.7–4.0)
Lymphocytes Relative: 24.5 % (ref 12.0–46.0)
MCHC: 33.5 g/dL (ref 30.0–36.0)
MCV: 92.4 fl (ref 78.0–100.0)
MONOS PCT: 7.9 % (ref 3.0–12.0)
Monocytes Absolute: 0.6 10*3/uL (ref 0.1–1.0)
NEUTROS ABS: 4.7 10*3/uL (ref 1.4–7.7)
NEUTROS PCT: 64.8 % (ref 43.0–77.0)
PLATELETS: 335 10*3/uL (ref 150.0–400.0)
RBC: 4.42 Mil/uL (ref 3.87–5.11)
RDW: 12.7 % (ref 11.5–15.5)
WBC: 7.2 10*3/uL (ref 4.0–10.5)

## 2015-08-07 LAB — URINALYSIS, ROUTINE W REFLEX MICROSCOPIC
BILIRUBIN URINE: NEGATIVE
KETONES UR: NEGATIVE
Nitrite: NEGATIVE
Specific Gravity, Urine: 1.01 (ref 1.000–1.030)
Total Protein, Urine: NEGATIVE
UROBILINOGEN UA: 0.2 (ref 0.0–1.0)
Urine Glucose: NEGATIVE
pH: 6 (ref 5.0–8.0)

## 2015-08-07 LAB — LIPID PANEL
CHOLESTEROL: 178 mg/dL (ref 0–200)
HDL: 47 mg/dL (ref >39.00)
LDL Cholesterol: 111 mg/dL — ABNORMAL HIGH (ref 0–99)
NonHDL: 131.33
TRIGLYCERIDES: 102 mg/dL (ref 0.0–149.0)
Total CHOL/HDL Ratio: 4
VLDL: 20.4 mg/dL (ref 0.0–40.0)

## 2015-08-07 LAB — BASIC METABOLIC PANEL
BUN: 11 mg/dL (ref 6–23)
CALCIUM: 9.5 mg/dL (ref 8.4–10.5)
CHLORIDE: 104 meq/L (ref 96–112)
CO2: 29 mEq/L (ref 19–32)
Creatinine, Ser: 0.83 mg/dL (ref 0.40–1.20)
GFR: 73.57 mL/min (ref >60.00)
Glucose, Bld: 92 mg/dL (ref 70–99)
POTASSIUM: 4.9 meq/L (ref 3.5–5.1)
SODIUM: 140 meq/L (ref 135–145)

## 2015-08-07 LAB — HEPATIC FUNCTION PANEL
ALBUMIN: 3.8 g/dL (ref 3.5–5.2)
ALT: 13 U/L (ref 0–35)
AST: 20 U/L (ref 0–37)
Alkaline Phosphatase: 62 U/L (ref 39–117)
Bilirubin, Direct: 0.1 mg/dL (ref 0.0–0.3)
Total Bilirubin: 0.5 mg/dL (ref 0.2–1.2)
Total Protein: 7.2 g/dL (ref 6.0–8.3)

## 2015-08-07 LAB — TSH: TSH: 0.55 u[IU]/mL (ref 0.35–4.50)

## 2015-08-07 LAB — VITAMIN B12

## 2015-08-14 ENCOUNTER — Ambulatory Visit (INDEPENDENT_AMBULATORY_CARE_PROVIDER_SITE_OTHER): Payer: BC Managed Care – PPO | Admitting: Internal Medicine

## 2015-08-14 ENCOUNTER — Encounter: Payer: Self-pay | Admitting: Internal Medicine

## 2015-08-14 VITALS — BP 135/80 | HR 78 | Ht 63.5 in | Wt 162.0 lb

## 2015-08-14 DIAGNOSIS — E538 Deficiency of other specified B group vitamins: Secondary | ICD-10-CM

## 2015-08-14 DIAGNOSIS — J189 Pneumonia, unspecified organism: Secondary | ICD-10-CM

## 2015-08-14 DIAGNOSIS — I10 Essential (primary) hypertension: Secondary | ICD-10-CM

## 2015-08-14 DIAGNOSIS — Z0000 Encounter for general adult medical examination without abnormal findings: Secondary | ICD-10-CM | POA: Diagnosis not present

## 2015-08-14 MED ORDER — CLOBETASOL PROPIONATE 0.05 % EX SOLN: 1.0000 "application " | Freq: Two times a day (BID) | CUTANEOUS | Status: DC

## 2015-08-14 MED ORDER — CYANOCOBALAMIN 1000 MCG/ML IJ SOLN: INTRAMUSCULAR | Status: DC

## 2015-08-14 NOTE — Progress Notes (Signed)
Pre visit review using our clinic review tool, if applicable. No additional management support is needed unless otherwise documented below in the visit note. 

## 2015-08-14 NOTE — Assessment & Plan Note (Signed)
9/16 RML (CT) 90-100% better

## 2015-08-14 NOTE — Progress Notes (Signed)
Subjective:  Patient ID: [name], female    DOB: 29-Nov-1950  Age: 64 y.o. MRN: [id number]  CC: No chief complaint on file.   HPI [name] presents for a well exam. F/u RML PNA (CT) - better 90%  Outpatient Prescriptions Prior to Visit  Medication Sig Dispense Refill  . Acetaminophen (TYLENOL ARTHRITIS PAIN PO) Take by mouth as needed.    Marland Kitchen aspirin 81 MG tablet Take 81 mg by mouth daily.      Marland Kitchen BIOTIN PO Take by mouth daily.    . Cholecalciferol 1000 UNITS capsule Take 1,000 Units by mouth daily.    . citalopram (CELEXA) 20 MG tablet TAKE 1 TABLET BY MOUTH EVERY DAY 30 tablet 11  . IRON PO Take by mouth daily.    . Ranitidine HCl (ZANTAC PO) Take by mouth as needed.      . clobetasol (TEMOVATE) 0.05 % external solution Apply 1 application topically 2 (two) times daily. Use qd on scalp as directed 50 mL 3  . cyanocobalamin (,VITAMIN B-12,) 1000 MCG/ML injection USE AS DIRECTED EVERY 2 WEEKS 20 mL 2  . levofloxacin (LEVAQUIN) 500 MG tablet Take 1 tablet (500 mg total) by mouth daily. 10 tablet 0  . promethazine-codeine (PHENERGAN WITH CODEINE) 6.25-10 MG/5ML syrup Take 5 mLs by mouth every 4 (four) hours as needed. 300 mL 0  . hyoscyamine (LEVBID) 0.375 MG 12 hr tablet Take 1 tablet (0.375 mg total) by mouth every 12 (twelve) hours as needed for cramping. 60 tablet 3   No facility-administered medications prior to visit.    ROS Review of Systems  Constitutional: Negative for chills, activity change, appetite change, fatigue and unexpected weight change.  HENT: Positive for rhinorrhea. Negative for congestion, ear pain, mouth sores, sinus pressure, sneezing, sore throat, trouble swallowing and voice change.   Eyes: Negative for visual disturbance.  Respiratory: Negative for cough, choking, chest tightness and wheezing.   Gastrointestinal: Negative for nausea, vomiting, abdominal pain and constipation.  Genitourinary: Negative for frequency, difficulty urinating and  vaginal pain.  Musculoskeletal: Negative for back pain and gait problem.  Skin: Negative for pallor and rash.  Neurological: Negative for dizziness, tremors, weakness, numbness and headaches.  Psychiatric/Behavioral: Negative for suicidal ideas, confusion, sleep disturbance and dysphoric mood.    Objective:  BP 135/80 mmHg  Pulse 78  Ht 5' 3.5" (1.613 m)  Wt 162 lb (73.483 kg)  BMI 28.24 kg/m2  SpO2 97%  BP Readings from Last 3 Encounters:  08/14/15 135/80  07/20/15 136/80  07/13/15 130/68    Wt Readings from Last 3 Encounters:  08/14/15 162 lb (73.483 kg)  07/20/15 167 lb (75.751 kg)  07/13/15 169 lb (76.658 kg)    Physical Exam  Constitutional: She appears well-developed. No distress.  HENT:  Head: Normocephalic.  Right Ear: External ear normal.  Left Ear: External ear normal.  Nose: Nose normal.  Mouth/Throat: Oropharynx is clear and moist.  Eyes: Conjunctivae are normal. Pupils are equal, round, and reactive to light. Right eye exhibits no discharge. Left eye exhibits no discharge.  Neck: Normal range of motion. Neck supple. No JVD present. No tracheal deviation present. No thyromegaly present.  Cardiovascular: Normal rate, regular rhythm and normal heart sounds.   Pulmonary/Chest: No stridor. No respiratory distress. She has no wheezes.  Abdominal: Soft. Bowel sounds are normal. She exhibits no distension and no mass. There is no tenderness. There is no rebound and no guarding.  Musculoskeletal: She exhibits no edema or  tenderness.  Lymphadenopathy:    She has no cervical adenopathy.  Neurological: She displays normal reflexes. No cranial nerve deficit. She exhibits normal muscle tone. Coordination normal.  Skin: No rash noted. No erythema.  Psychiatric: She has a normal mood and affect. Her behavior is normal. Judgment and thought content normal.    Lab Results  Component Value Date   WBC 7.2 08/07/2015   HGB 13.7 08/07/2015   HCT 40.8 08/07/2015   PLT 335.0  08/07/2015   GLUCOSE 92 08/07/2015   CHOL 178 08/07/2015   TRIG 102.0 08/07/2015   HDL 47.00 08/07/2015   LDLDIRECT 132.3 01/28/2009   LDLCALC 111* 08/07/2015   ALT 13 08/07/2015   AST 20 08/07/2015   NA 140 08/07/2015   K 4.9 08/07/2015   CL 104 08/07/2015   CREATININE 0.83 08/07/2015   BUN 11 08/07/2015   CO2 29 08/07/2015   TSH 0.55 08/07/2015    Ct Chest Wo Contrast  07/27/2015  CLINICAL DATA:  Productive cough for the past 5 weeks. Follow-up clinically diagnosed right lung pneumonia. EXAM: CT CHEST WITHOUT CONTRAST TECHNIQUE: Multidetector CT imaging of the chest was performed following the standard protocol without IV contrast. COMPARISON:  Chest radiographs dated 07/20/2015. FINDINGS: Small amount of patchy density in the right middle lobe. The remainder of the lungs are clear. No lung nodules or enlarged lymph nodes. Mild diffuse peribronchial thickening. Cholecystectomy clips. 6 mm oval area of low density in the spleen on image number 50. Mild thoracic spine degenerative changes. IMPRESSION: 1. Mild right middle lobe pneumonia. 2. Mild bronchitic changes. 3. 6 mm probable cyst or hemangioma in the spleen. Electronically Signed   By: Beckie SaltsSteven  Reid M.D.   On: 07/27/2015 08:49    Assessment & Plan:   Diagnoses and all orders for this visit:  Essential hypertension  Pneumonia due to organism  B12 deficiency  Other orders -     clobetasol (TEMOVATE) 0.05 % external solution; Apply 1 application topically 2 (two) times daily. Use qd on scalp as directed -     cyanocobalamin (,VITAMIN B-12,) 1000 MCG/ML injection; USE AS DIRECTED EVERY 2 WEEKS   I have discontinued [name]'s levofloxacin and promethazine-codeine. I am also having her maintain her aspirin, Ranitidine HCl (ZANTAC PO), hyoscyamine, Cholecalciferol, IRON PO, Acetaminophen (TYLENOL ARTHRITIS PAIN PO), BIOTIN PO, citalopram, clobetasol, and cyanocobalamin.  Meds ordered this encounter  Medications  .  clobetasol (TEMOVATE) 0.05 % external solution    Sig: Apply 1 application topically 2 (two) times daily. Use qd on scalp as directed    Dispense:  50 mL    Refill:  3  . cyanocobalamin (,VITAMIN B-12,) 1000 MCG/ML injection    Sig: USE AS DIRECTED EVERY 2 WEEKS    Dispense:  20 mL    Refill:  2     Follow-up: Return in about 6 months (around 02/12/2016) for a follow-up visit.  Sonda PrimesAlex Plotnikov, MD

## 2015-08-14 NOTE — Assessment & Plan Note (Signed)
Recheck BP

## 2015-08-14 NOTE — Patient Instructions (Signed)
Normal BP<130/85 

## 2015-08-14 NOTE — Assessment & Plan Note (Signed)
On B12 

## 2015-11-28 ENCOUNTER — Other Ambulatory Visit: Payer: Self-pay | Admitting: Internal Medicine

## 2016-02-11 ENCOUNTER — Other Ambulatory Visit: Payer: Self-pay | Admitting: *Deleted

## 2016-02-11 ENCOUNTER — Other Ambulatory Visit (INDEPENDENT_AMBULATORY_CARE_PROVIDER_SITE_OTHER): Payer: BC Managed Care – PPO

## 2016-02-11 DIAGNOSIS — I10 Essential (primary) hypertension: Secondary | ICD-10-CM | POA: Diagnosis not present

## 2016-02-11 DIAGNOSIS — R945 Abnormal results of liver function studies: Secondary | ICD-10-CM | POA: Diagnosis not present

## 2016-02-11 DIAGNOSIS — E785 Hyperlipidemia, unspecified: Secondary | ICD-10-CM

## 2016-02-11 LAB — CBC WITH DIFFERENTIAL/PLATELET
BASOS ABS: 0 10*3/uL (ref 0.0–0.1)
Basophils Relative: 0.5 % (ref 0.0–3.0)
EOS ABS: 0.1 10*3/uL (ref 0.0–0.7)
Eosinophils Relative: 0.7 % (ref 0.0–5.0)
HCT: 39.1 % (ref 36.0–46.0)
Hemoglobin: 13.1 g/dL (ref 12.0–15.0)
LYMPHS ABS: 1.7 10*3/uL (ref 0.7–4.0)
LYMPHS PCT: 23.2 % (ref 12.0–46.0)
MCHC: 33.3 g/dL (ref 30.0–36.0)
MCV: 91.6 fl (ref 78.0–100.0)
MONO ABS: 0.5 10*3/uL (ref 0.1–1.0)
Monocytes Relative: 6.8 % (ref 3.0–12.0)
NEUTROS ABS: 5 10*3/uL (ref 1.4–7.7)
NEUTROS PCT: 68.8 % (ref 43.0–77.0)
PLATELETS: 352 10*3/uL (ref 150.0–400.0)
RBC: 4.27 Mil/uL (ref 3.87–5.11)
RDW: 13.2 % (ref 11.5–15.5)
WBC: 7.3 10*3/uL (ref 4.0–10.5)

## 2016-02-11 LAB — LIPID PANEL
CHOL/HDL RATIO: 4
Cholesterol: 185 mg/dL (ref 0–200)
HDL: 51.4 mg/dL (ref >39.00)
LDL CALC: 117 mg/dL — AB (ref 0–99)
NONHDL: 133.31
Triglycerides: 81 mg/dL (ref 0.0–149.0)
VLDL: 16.2 mg/dL (ref 0.0–40.0)

## 2016-02-11 LAB — COMPREHENSIVE METABOLIC PANEL
ALT: 13 U/L (ref 0–35)
AST: 18 U/L (ref 0–37)
Albumin: 3.9 g/dL (ref 3.5–5.2)
Alkaline Phosphatase: 65 U/L (ref 39–117)
BILIRUBIN TOTAL: 0.5 mg/dL (ref 0.2–1.2)
BUN: 11 mg/dL (ref 6–23)
CHLORIDE: 104 meq/L (ref 96–112)
CO2: 29 meq/L (ref 19–32)
CREATININE: 0.84 mg/dL (ref 0.40–1.20)
Calcium: 9.5 mg/dL (ref 8.4–10.5)
GFR: 72.44 mL/min (ref >60.00)
GLUCOSE: 102 mg/dL — AB (ref 70–99)
Potassium: 4.4 mEq/L (ref 3.5–5.1)
Sodium: 139 mEq/L (ref 135–145)
Total Protein: 7.3 g/dL (ref 6.0–8.3)

## 2016-02-11 LAB — URINALYSIS, ROUTINE W REFLEX MICROSCOPIC
Bilirubin Urine: NEGATIVE
KETONES UR: NEGATIVE
NITRITE: NEGATIVE
TOTAL PROTEIN, URINE-UPE24: NEGATIVE
URINE GLUCOSE: NEGATIVE
UROBILINOGEN UA: 0.2 (ref 0.0–1.0)
pH: 6 (ref 5.0–8.0)

## 2016-02-11 LAB — TSH: TSH: 1.01 u[IU]/mL (ref 0.35–4.50)

## 2016-02-18 ENCOUNTER — Ambulatory Visit (INDEPENDENT_AMBULATORY_CARE_PROVIDER_SITE_OTHER): Payer: BC Managed Care – PPO | Admitting: Internal Medicine

## 2016-02-18 ENCOUNTER — Encounter: Payer: Self-pay | Admitting: Internal Medicine

## 2016-02-18 VITALS — BP 120/64 | HR 94 | Wt 166.0 lb

## 2016-02-18 DIAGNOSIS — I10 Essential (primary) hypertension: Secondary | ICD-10-CM

## 2016-02-18 DIAGNOSIS — E538 Deficiency of other specified B group vitamins: Secondary | ICD-10-CM

## 2016-02-18 DIAGNOSIS — F329 Major depressive disorder, single episode, unspecified: Secondary | ICD-10-CM

## 2016-02-18 DIAGNOSIS — F32A Depression, unspecified: Secondary | ICD-10-CM

## 2016-02-18 MED ORDER — CITALOPRAM HYDROBROMIDE 20 MG PO TABS: 20.0000 mg | ORAL_TABLET | Freq: Every day | ORAL | Status: DC

## 2016-02-18 NOTE — Assessment & Plan Note (Signed)
On B12 

## 2016-02-18 NOTE — Progress Notes (Signed)
Subjective:  Patient ID: [name], female    DOB: [birth year]  Age: 65 y.o. MRN: [id number]  CC: No chief complaint on file.   HPI [name] presents for   Outpatient Prescriptions Prior to Visit  Medication Sig Dispense Refill  . Acetaminophen (TYLENOL ARTHRITIS PAIN PO) Take by mouth as needed.    Marland Kitchen aspirin 81 MG tablet Take 81 mg by mouth daily.      Marland Kitchen BIOTIN PO Take by mouth daily.    . Cholecalciferol 1000 UNITS capsule Take 1,000 Units by mouth daily.    . clobetasol (TEMOVATE) 0.05 % external solution Apply 1 application topically 2 (two) times daily. Use qd on scalp as directed 50 mL 3  . cyanocobalamin (,VITAMIN B-12,) 1000 MCG/ML injection USE AS DIRECTED EVERY 2 WEEKS 20 mL 2  . IRON PO Take by mouth daily.    . Ranitidine HCl (ZANTAC PO) Take by mouth as needed.      . citalopram (CELEXA) 20 MG tablet Take 1 tablet (20 mg total) by mouth daily. Yearly physical due in April must see md for refills 30 tablet 2  . hyoscyamine (LEVBID) 0.375 MG 12 hr tablet Take 1 tablet (0.375 mg total) by mouth every 12 (twelve) hours as needed for cramping. 60 tablet 3   No facility-administered medications prior to visit.    ROS Review of Systems  Constitutional: Negative for chills, activity change, appetite change, fatigue and unexpected weight change.  HENT: Negative for congestion, mouth sores and sinus pressure.   Eyes: Negative for visual disturbance.  Respiratory: Negative for cough and chest tightness.   Gastrointestinal: Negative for nausea and abdominal pain.  Genitourinary: Negative for frequency, difficulty urinating and vaginal pain.  Musculoskeletal: Negative for back pain and gait problem.  Skin: Negative for pallor and rash.  Neurological: Negative for dizziness, tremors, weakness, numbness and headaches.  Psychiatric/Behavioral: Negative for suicidal ideas, confusion and sleep disturbance.    Objective:  BP 120/64 mmHg  Pulse 94  Wt 166 lb  (75.297 kg)  SpO2 97%  BP Readings from Last 3 Encounters:  02/18/16 120/64  08/14/15 135/80  07/20/15 136/80    Wt Readings from Last 3 Encounters:  02/18/16 166 lb (75.297 kg)  08/14/15 162 lb (73.483 kg)  07/20/15 167 lb (75.751 kg)    Physical Exam  Constitutional: She appears well-developed. No distress.  HENT:  Head: Normocephalic.  Right Ear: External ear normal.  Left Ear: External ear normal.  Nose: Nose normal.  Mouth/Throat: Oropharynx is clear and moist.  Eyes: Conjunctivae are normal. Pupils are equal, round, and reactive to light. Right eye exhibits no discharge. Left eye exhibits no discharge.  Neck: Normal range of motion. Neck supple. No JVD present. No tracheal deviation present. No thyromegaly present.  Cardiovascular: Normal rate, regular rhythm and normal heart sounds.   Pulmonary/Chest: No stridor. No respiratory distress. She has no wheezes.  Abdominal: Soft. Bowel sounds are normal. She exhibits no distension and no mass. There is no tenderness. There is no rebound and no guarding.  Musculoskeletal: She exhibits no edema or tenderness.  Lymphadenopathy:    She has no cervical adenopathy.  Neurological: She displays normal reflexes. No cranial nerve deficit. She exhibits normal muscle tone. Coordination normal.  Skin: No rash noted. No erythema.  Psychiatric: She has a normal mood and affect. Her behavior is normal. Judgment and thought content normal.    Lab Results  Component Value Date   WBC 7.3 02/11/2016  HGB 13.1 02/11/2016   HCT 39.1 02/11/2016   PLT 352.0 02/11/2016   GLUCOSE 102* 02/11/2016   CHOL 185 02/11/2016   TRIG 81.0 02/11/2016   HDL 51.40 02/11/2016   LDLDIRECT 132.3 01/28/2009   LDLCALC 117* 02/11/2016   ALT 13 02/11/2016   AST 18 02/11/2016   NA 139 02/11/2016   K 4.4 02/11/2016   CL 104 02/11/2016   CREATININE 0.84 02/11/2016   BUN 11 02/11/2016   CO2 29 02/11/2016   TSH 1.01 02/11/2016    Ct Chest Wo  Contrast  07/27/2015  CLINICAL DATA:  Productive cough for the past 5 weeks. Follow-up clinically diagnosed right lung pneumonia. EXAM: CT CHEST WITHOUT CONTRAST TECHNIQUE: Multidetector CT imaging of the chest was performed following the standard protocol without IV contrast. COMPARISON:  Chest radiographs dated 07/20/2015. FINDINGS: Small amount of patchy density in the right middle lobe. The remainder of the lungs are clear. No lung nodules or enlarged lymph nodes. Mild diffuse peribronchial thickening. Cholecystectomy clips. 6 mm oval area of low density in the spleen on image number 50. Mild thoracic spine degenerative changes. IMPRESSION: 1. Mild right middle lobe pneumonia. 2. Mild bronchitic changes. 3. 6 mm probable cyst or hemangioma in the spleen. Electronically Signed   By: Beckie SaltsSteven  Reid M.D.   On: 07/27/2015 08:49    Assessment & Plan:   There are no diagnoses linked to this encounter. I have discontinued [name]'s hyoscyamine. I have also changed her citalopram. Additionally, I am having her maintain her aspirin, Ranitidine HCl (ZANTAC PO), Cholecalciferol, IRON PO, Acetaminophen (TYLENOL ARTHRITIS PAIN PO), BIOTIN PO, clobetasol, cyanocobalamin, and naproxen sodium.  Meds ordered this encounter  Medications  . naproxen sodium (ALEVE) 220 MG tablet    Sig: Take 220 mg by mouth 2 (two) times daily with a meal.  . citalopram (CELEXA) 20 MG tablet    Sig: Take 1 tablet (20 mg total) by mouth daily.    Dispense:  90 tablet    Refill:  2     Follow-up: No Follow-up on file.  Sonda PrimesAlex Plotnikov, MD

## 2016-02-18 NOTE — Progress Notes (Signed)
Pre visit review using our clinic review tool, if applicable. No additional management support is needed unless otherwise documented below in the visit note. 

## 2016-02-18 NOTE — Assessment & Plan Note (Signed)
On Celexa 

## 2016-02-18 NOTE — Assessment & Plan Note (Signed)
BP Readings from Last 3 Encounters:  02/18/16 120/64  08/14/15 135/80  07/20/15 136/80  on diet

## 2016-06-06 ENCOUNTER — Telehealth: Payer: Self-pay | Admitting: Internal Medicine

## 2016-06-06 DIAGNOSIS — Z0000 Encounter for general adult medical examination without abnormal findings: Secondary | ICD-10-CM

## 2016-06-06 NOTE — Telephone Encounter (Signed)
Labs ordered. Left detailed mess informing pt.  

## 2016-06-06 NOTE — Telephone Encounter (Signed)
paktient is requesting that we enter cpe labwork for her visit 07/28/2016

## 2016-07-21 ENCOUNTER — Other Ambulatory Visit (INDEPENDENT_AMBULATORY_CARE_PROVIDER_SITE_OTHER): Payer: BC Managed Care – PPO

## 2016-07-21 DIAGNOSIS — Z0000 Encounter for general adult medical examination without abnormal findings: Secondary | ICD-10-CM

## 2016-07-21 LAB — BASIC METABOLIC PANEL
BUN: 13 mg/dL (ref 6–23)
CO2: 29 mEq/L (ref 19–32)
CREATININE: 0.85 mg/dL (ref 0.40–1.20)
Calcium: 8.7 mg/dL (ref 8.4–10.5)
Chloride: 103 mEq/L (ref 96–112)
GFR: 71.36 mL/min (ref >60.00)
GLUCOSE: 84 mg/dL (ref 70–99)
POTASSIUM: 4 meq/L (ref 3.5–5.1)
Sodium: 138 mEq/L (ref 135–145)

## 2016-07-21 LAB — TSH: TSH: 1.22 u[IU]/mL (ref 0.35–4.50)

## 2016-07-21 LAB — HEPATIC FUNCTION PANEL
ALK PHOS: 69 U/L (ref 39–117)
ALT: 13 U/L (ref 0–35)
AST: 15 U/L (ref 0–37)
Albumin: 3.7 g/dL (ref 3.5–5.2)
BILIRUBIN DIRECT: 0 mg/dL (ref 0.0–0.3)
TOTAL PROTEIN: 7 g/dL (ref 6.0–8.3)
Total Bilirubin: 0.7 mg/dL (ref 0.2–1.2)

## 2016-07-21 LAB — LIPID PANEL
CHOL/HDL RATIO: 3
CHOLESTEROL: 177 mg/dL (ref 0–200)
HDL: 51.5 mg/dL (ref >39.00)
LDL CALC: 108 mg/dL — AB (ref 0–99)
NonHDL: 125.64
TRIGLYCERIDES: 90 mg/dL (ref 0.0–149.0)
VLDL: 18 mg/dL (ref 0.0–40.0)

## 2016-07-21 LAB — URINALYSIS, ROUTINE W REFLEX MICROSCOPIC
BILIRUBIN URINE: NEGATIVE
HGB URINE DIPSTICK: NEGATIVE
Ketones, ur: NEGATIVE
Nitrite: NEGATIVE
PH: 6 (ref 5.0–8.0)
RBC / HPF: NONE SEEN (ref 0–?)
Specific Gravity, Urine: <=1.005 — AB (ref 1.000–1.030)
Total Protein, Urine: NEGATIVE
Urine Glucose: NEGATIVE
Urobilinogen, UA: 0.2 (ref 0.0–1.0)

## 2016-07-21 LAB — CBC WITH DIFFERENTIAL/PLATELET
BASOS ABS: 0 10*3/uL (ref 0.0–0.1)
Basophils Relative: 0.4 % (ref 0.0–3.0)
EOS ABS: 0.2 10*3/uL (ref 0.0–0.7)
EOS PCT: 2.1 % (ref 0.0–5.0)
HEMATOCRIT: 37.5 % (ref 36.0–46.0)
HEMOGLOBIN: 12.8 g/dL (ref 12.0–15.0)
Lymphocytes Relative: 25 % (ref 12.0–46.0)
Lymphs Abs: 1.8 10*3/uL (ref 0.7–4.0)
MCHC: 34.1 g/dL (ref 30.0–36.0)
MCV: 90.4 fl (ref 78.0–100.0)
MONO ABS: 0.6 10*3/uL (ref 0.1–1.0)
MONOS PCT: 7.7 % (ref 3.0–12.0)
Neutro Abs: 4.7 10*3/uL (ref 1.4–7.7)
Neutrophils Relative %: 64.8 % (ref 43.0–77.0)
Platelets: 261 10*3/uL (ref 150.0–400.0)
RBC: 4.14 Mil/uL (ref 3.87–5.11)
RDW: 13.4 % (ref 11.5–15.5)
WBC: 7.2 10*3/uL (ref 4.0–10.5)

## 2016-07-21 LAB — VITAMIN B12: VITAMIN B 12: 491 pg/mL (ref 211–911)

## 2016-07-28 ENCOUNTER — Ambulatory Visit (INDEPENDENT_AMBULATORY_CARE_PROVIDER_SITE_OTHER): Payer: BC Managed Care – PPO | Admitting: Internal Medicine

## 2016-07-28 ENCOUNTER — Encounter: Payer: Self-pay | Admitting: Internal Medicine

## 2016-07-28 DIAGNOSIS — F329 Major depressive disorder, single episode, unspecified: Secondary | ICD-10-CM

## 2016-07-28 DIAGNOSIS — R05 Cough: Secondary | ICD-10-CM | POA: Insufficient documentation

## 2016-07-28 DIAGNOSIS — R059 Cough, unspecified: Secondary | ICD-10-CM | POA: Insufficient documentation

## 2016-07-28 DIAGNOSIS — Z0000 Encounter for general adult medical examination without abnormal findings: Secondary | ICD-10-CM | POA: Diagnosis not present

## 2016-07-28 DIAGNOSIS — E538 Deficiency of other specified B group vitamins: Secondary | ICD-10-CM

## 2016-07-28 DIAGNOSIS — F32A Depression, unspecified: Secondary | ICD-10-CM

## 2016-07-28 DIAGNOSIS — I10 Essential (primary) hypertension: Secondary | ICD-10-CM

## 2016-07-28 MED ORDER — LORATADINE 10 MG PO TABS: 10.0000 mg | ORAL_TABLET | Freq: Every day | ORAL | 3 refills | Status: DC

## 2016-07-28 MED ORDER — FLUTICASONE PROPIONATE 50 MCG/ACT NA SUSP: 2.0000 | Freq: Every day | NASAL | 6 refills | Status: DC

## 2016-07-28 NOTE — Assessment & Plan Note (Signed)
Mild, diet controlled  

## 2016-07-28 NOTE — Assessment & Plan Note (Signed)
On Citalopram 

## 2016-07-28 NOTE — Assessment & Plan Note (Signed)
We discussed age appropriate health related issues, including available/recomended screening tests and vaccinations. We discussed a need for adhering to healthy diet and exercise. Labs/EKG were reviewed/ordered. All questions were answered. EKG - no change from last year 

## 2016-07-28 NOTE — Progress Notes (Signed)
Subjective:  Patient ID: [name], female    DOB: 1951/01/06  Age: 65 y.o. MRN: [id number]  CC: Annual Exam   HPI JAIMY KLIETHERMES presents for a well exam. Imojean is retiring in March 2018. C/o cough - months since pneumonia. C/o post nasal drainage - used OTC meds  Outpatient Medications Prior to Visit  Medication Sig Dispense Refill  . Acetaminophen (TYLENOL ARTHRITIS PAIN PO) Take by mouth as needed.    Marland Kitchen aspirin 81 MG tablet Take 81 mg by mouth daily.      Marland Kitchen BIOTIN PO Take by mouth daily.    . Cholecalciferol 1000 UNITS capsule Take 1,000 Units by mouth daily.    . citalopram (CELEXA) 20 MG tablet Take 1 tablet (20 mg total) by mouth daily. 90 tablet 2  . clobetasol (TEMOVATE) 0.05 % external solution Apply 1 application topically 2 (two) times daily. Use qd on scalp as directed 50 mL 3  . cyanocobalamin (,VITAMIN B-12,) 1000 MCG/ML injection USE AS DIRECTED EVERY 2 WEEKS 20 mL 2  . IRON PO Take by mouth daily.    . naproxen sodium (ALEVE) 220 MG tablet Take 220 mg by mouth 2 (two) times daily with a meal.    . Ranitidine HCl (ZANTAC PO) Take by mouth as needed.       No facility-administered medications prior to visit.     ROS Review of Systems  Constitutional: Negative for activity change, appetite change, chills, fatigue and unexpected weight change.  HENT: Negative for congestion, mouth sores and sinus pressure.   Eyes: Negative for visual disturbance.  Respiratory: Negative for cough and chest tightness.   Gastrointestinal: Negative for abdominal pain and nausea.  Genitourinary: Negative for difficulty urinating, frequency and vaginal pain.  Musculoskeletal: Negative for back pain and gait problem.  Skin: Negative for pallor and rash.  Neurological: Negative for dizziness, tremors, weakness, numbness and headaches.  Psychiatric/Behavioral: Negative for confusion and sleep disturbance.    Objective:  BP 130/62   Pulse (!) 56   Temp 97.9 F (36.6 C) (Oral)    Ht 5\' 4"  (1.626 m)   Wt 166 lb (75.3 kg)   SpO2 93%   BMI 28.49 kg/m   BP Readings from Last 3 Encounters:  07/28/16 130/62  02/18/16 120/64  08/14/15 135/80    Wt Readings from Last 3 Encounters:  07/28/16 166 lb (75.3 kg)  02/18/16 166 lb (75.3 kg)  08/14/15 162 lb (73.5 kg)    Physical Exam  Constitutional: She appears well-developed. No distress.  HENT:  Head: Normocephalic.  Right Ear: External ear normal.  Left Ear: External ear normal.  Nose: Nose normal.  Mouth/Throat: Oropharynx is clear and moist.  Eyes: Conjunctivae are normal. Pupils are equal, round, and reactive to light. Right eye exhibits no discharge. Left eye exhibits no discharge.  Neck: Normal range of motion. Neck supple. No JVD present. No tracheal deviation present. No thyromegaly present.  Cardiovascular: Normal rate, regular rhythm and normal heart sounds.   Pulmonary/Chest: No stridor. No respiratory distress. She has no wheezes.  Abdominal: Soft. Bowel sounds are normal. She exhibits no distension and no mass. There is no tenderness. There is no rebound and no guarding.  Musculoskeletal: She exhibits no edema or tenderness.  Lymphadenopathy:    She has no cervical adenopathy.  Neurological: She displays normal reflexes. No cranial nerve deficit. She exhibits normal muscle tone. Coordination normal.  Skin: No rash noted. No erythema.  Psychiatric: She has a normal  mood and affect. Her behavior is normal. Judgment and thought content normal.    Lab Results  Component Value Date   WBC 7.2 07/21/2016   HGB 12.8 07/21/2016   HCT 37.5 07/21/2016   PLT 261.0 07/21/2016   GLUCOSE 84 07/21/2016   CHOL 177 07/21/2016   TRIG 90.0 07/21/2016   HDL 51.50 07/21/2016   LDLDIRECT 132.3 01/28/2009   LDLCALC 108 (H) 07/21/2016   ALT 13 07/21/2016   AST 15 07/21/2016   NA 138 07/21/2016   K 4.0 07/21/2016   CL 103 07/21/2016   CREATININE 0.85 07/21/2016   BUN 13 07/21/2016   CO2 29 07/21/2016    TSH 1.22 07/21/2016    Ct Chest Wo Contrast  Result Date: 07/27/2015 CLINICAL DATA:  Productive cough for the past 5 weeks. Follow-up clinically diagnosed right lung pneumonia. EXAM: CT CHEST WITHOUT CONTRAST TECHNIQUE: Multidetector CT imaging of the chest was performed following the standard protocol without IV contrast. COMPARISON:  Chest radiographs dated 07/20/2015. FINDINGS: Small amount of patchy density in the right middle lobe. The remainder of the lungs are clear. No lung nodules or enlarged lymph nodes. Mild diffuse peribronchial thickening. Cholecystectomy clips. 6 mm oval area of low density in the spleen on image number 50. Mild thoracic spine degenerative changes. IMPRESSION: 1. Mild right middle lobe pneumonia. 2. Mild bronchitic changes. 3. 6 mm probable cyst or hemangioma in the spleen. Electronically Signed   By: Beckie SaltsSteven  Reid M.D.   On: 07/27/2015 08:49    Assessment & Plan:   There are no diagnoses linked to this encounter. I am having Ms. Marlyne BeardsJennings maintain her aspirin, Ranitidine HCl (ZANTAC PO), Cholecalciferol, IRON PO, Acetaminophen (TYLENOL ARTHRITIS PAIN PO), BIOTIN PO, clobetasol, cyanocobalamin, naproxen sodium, and citalopram.  No orders of the defined types were placed in this encounter.    Follow-up: No Follow-up on file.  Sonda PrimesAlex Plotnikov, MD

## 2016-07-28 NOTE — Assessment & Plan Note (Signed)
post-nasal drip (?): start Flonase, Claritin

## 2016-07-28 NOTE — Assessment & Plan Note (Signed)
On B12 

## 2016-07-28 NOTE — Progress Notes (Signed)
Pre visit review using our clinic review tool, if applicable. No additional management support is needed unless otherwise documented below in the visit note. 

## 2016-08-23 ENCOUNTER — Encounter: Payer: BC Managed Care – PPO | Admitting: Internal Medicine

## 2016-08-26 ENCOUNTER — Ambulatory Visit (INDEPENDENT_AMBULATORY_CARE_PROVIDER_SITE_OTHER): Payer: BC Managed Care – PPO

## 2016-08-26 DIAGNOSIS — Z23 Encounter for immunization: Secondary | ICD-10-CM

## 2016-10-19 LAB — HM MAMMOGRAPHY

## 2016-11-02 ENCOUNTER — Encounter: Payer: Self-pay | Admitting: Internal Medicine

## 2016-11-02 NOTE — Progress Notes (Signed)
[facility] mammography results abtracted

## 2016-11-26 ENCOUNTER — Other Ambulatory Visit: Payer: Self-pay | Admitting: Internal Medicine

## 2017-02-10 ENCOUNTER — Telehealth: Payer: Self-pay

## 2017-02-10 DIAGNOSIS — Z0000 Encounter for general adult medical examination without abnormal findings: Secondary | ICD-10-CM

## 2017-02-10 NOTE — Telephone Encounter (Signed)
CBC BMET LFT lipids TSH UA  Thx

## 2017-02-10 NOTE — Telephone Encounter (Signed)
Labs ordered.

## 2017-02-10 NOTE — Telephone Encounter (Signed)
Patient called in requesting labs to be in before her physical next Friday. Please advise on what labs to order.

## 2017-02-13 ENCOUNTER — Other Ambulatory Visit (INDEPENDENT_AMBULATORY_CARE_PROVIDER_SITE_OTHER): Payer: BC Managed Care – PPO

## 2017-02-13 DIAGNOSIS — Z0000 Encounter for general adult medical examination without abnormal findings: Secondary | ICD-10-CM | POA: Diagnosis not present

## 2017-02-13 LAB — HEPATIC FUNCTION PANEL
ALBUMIN: 4.5 g/dL (ref 3.5–5.2)
ALT: 9 U/L (ref 0–35)
AST: 17 U/L (ref 0–37)
Alkaline Phosphatase: 77 U/L (ref 39–117)
Bilirubin, Direct: 0.1 mg/dL (ref 0.0–0.3)
TOTAL PROTEIN: 8.3 g/dL (ref 6.0–8.3)
Total Bilirubin: 0.8 mg/dL (ref 0.2–1.2)

## 2017-02-13 LAB — CBC WITH DIFFERENTIAL/PLATELET
BASOS PCT: 1 % (ref 0.0–3.0)
Basophils Absolute: 0.1 10*3/uL (ref 0.0–0.1)
EOS PCT: 0.5 % (ref 0.0–5.0)
Eosinophils Absolute: 0 10*3/uL (ref 0.0–0.7)
HEMATOCRIT: 44 % (ref 36.0–46.0)
HEMOGLOBIN: 14.9 g/dL (ref 12.0–15.0)
Lymphocytes Relative: 24.1 % (ref 12.0–46.0)
Lymphs Abs: 2.2 10*3/uL (ref 0.7–4.0)
MCHC: 33.8 g/dL (ref 30.0–36.0)
MCV: 92.1 fl (ref 78.0–100.0)
MONO ABS: 0.5 10*3/uL (ref 0.1–1.0)
MONOS PCT: 5.5 % (ref 3.0–12.0)
Neutro Abs: 6.3 10*3/uL (ref 1.4–7.7)
Neutrophils Relative %: 68.9 % (ref 43.0–77.0)
Platelets: 300 10*3/uL (ref 150.0–400.0)
RBC: 4.77 Mil/uL (ref 3.87–5.11)
RDW: 13 % (ref 11.5–15.5)
WBC: 9.1 10*3/uL (ref 4.0–10.5)

## 2017-02-13 LAB — URINALYSIS, ROUTINE W REFLEX MICROSCOPIC
Bilirubin Urine: NEGATIVE
KETONES UR: NEGATIVE
NITRITE: NEGATIVE
RBC / HPF: NONE SEEN (ref 0–?)
Total Protein, Urine: NEGATIVE
URINE GLUCOSE: NEGATIVE
UROBILINOGEN UA: 0.2 (ref 0.0–1.0)
pH: 6 (ref 5.0–8.0)

## 2017-02-13 LAB — COMPREHENSIVE METABOLIC PANEL
ALBUMIN: 4.5 g/dL (ref 3.5–5.2)
ALT: 9 U/L (ref 0–35)
AST: 17 U/L (ref 0–37)
Alkaline Phosphatase: 77 U/L (ref 39–117)
BUN: 13 mg/dL (ref 6–23)
CHLORIDE: 100 meq/L (ref 96–112)
CO2: 26 mEq/L (ref 19–32)
CREATININE: 0.88 mg/dL (ref 0.40–1.20)
Calcium: 10 mg/dL (ref 8.4–10.5)
GFR: 68.44 mL/min (ref >60.00)
GLUCOSE: 95 mg/dL (ref 70–99)
POTASSIUM: 3.8 meq/L (ref 3.5–5.1)
SODIUM: 136 meq/L (ref 135–145)
TOTAL PROTEIN: 8.3 g/dL (ref 6.0–8.3)
Total Bilirubin: 0.8 mg/dL (ref 0.2–1.2)

## 2017-02-13 LAB — TSH: TSH: 0.73 u[IU]/mL (ref 0.35–4.50)

## 2017-02-17 ENCOUNTER — Encounter: Payer: Self-pay | Admitting: Neurology

## 2017-02-17 ENCOUNTER — Ambulatory Visit (INDEPENDENT_AMBULATORY_CARE_PROVIDER_SITE_OTHER): Payer: Medicare Other | Admitting: Internal Medicine

## 2017-02-17 ENCOUNTER — Encounter: Payer: Self-pay | Admitting: Internal Medicine

## 2017-02-17 VITALS — BP 128/82 | HR 80 | Temp 98.0°F | Ht 64.0 in | Wt 171.0 lb

## 2017-02-17 DIAGNOSIS — I10 Essential (primary) hypertension: Secondary | ICD-10-CM

## 2017-02-17 DIAGNOSIS — R404 Transient alteration of awareness: Secondary | ICD-10-CM

## 2017-02-17 DIAGNOSIS — Z23 Encounter for immunization: Secondary | ICD-10-CM

## 2017-02-17 DIAGNOSIS — E785 Hyperlipidemia, unspecified: Secondary | ICD-10-CM | POA: Diagnosis not present

## 2017-02-17 DIAGNOSIS — E538 Deficiency of other specified B group vitamins: Secondary | ICD-10-CM

## 2017-02-17 HISTORY — DX: Transient alteration of awareness: R40.4

## 2017-02-17 NOTE — Assessment & Plan Note (Signed)
  On diet  

## 2017-02-17 NOTE — Patient Instructions (Signed)
[facility] well w/Jill 

## 2017-02-17 NOTE — Assessment & Plan Note (Signed)
On B12 

## 2017-02-17 NOTE — Progress Notes (Signed)
Subjective:  Patient ID: [name], female    DOB: [birth year]  Age: 66 y.o. MRN: [id number]  CC: No chief complaint on file.   HPI [name] presents for a 6 mo f/u. C/o one episode of making the the wrong turn and being unaware of her surroundings that lasted x 5-10 min 1 wk ago. No LOC, no seizure, HA , nausea, etc    Outpatient Medications Prior to Visit  Medication Sig Dispense Refill  . Acetaminophen (TYLENOL ARTHRITIS PAIN PO) Take by mouth as needed.    Marland Kitchen aspirin 81 MG tablet Take 81 mg by mouth daily.      . Cholecalciferol 1000 UNITS capsule Take 1,000 Units by mouth daily.    . citalopram (CELEXA) 20 MG tablet Take 1 tablet (20 mg total) by mouth daily. 90 tablet 2  . cyanocobalamin (,VITAMIN B-12,) 1000 MCG/ML injection USE AS DIRECTED EVERY 2 WEEKS 30 mL 1  . loratadine (CLARITIN) 10 MG tablet Take 1 tablet (10 mg total) by mouth daily. 30 tablet 3  . Ranitidine HCl (ZANTAC PO) Take by mouth as needed.      . naproxen sodium (ALEVE) 220 MG tablet Take 220 mg by mouth 2 (two) times daily with a meal.    . BIOTIN PO Take by mouth daily.    . clobetasol (TEMOVATE) 0.05 % external solution Apply 1 application topically 2 (two) times daily. Use qd on scalp as directed 50 mL 3  . fluticasone (FLONASE) 50 MCG/ACT nasal spray Place 2 sprays into both nostrils daily. 16 g 6  . IRON PO Take by mouth daily.     No facility-administered medications prior to visit.     ROS Review of Systems  Constitutional: Negative for activity change, appetite change, chills, fatigue and unexpected weight change.  HENT: Negative for congestion, mouth sores and sinus pressure.   Eyes: Negative for visual disturbance.  Respiratory: Negative for cough and chest tightness.   Gastrointestinal: Negative for abdominal pain and nausea.  Genitourinary: Negative for difficulty urinating, frequency and vaginal pain.  Musculoskeletal: Negative for back pain and gait problem.  Skin:  Negative for pallor and rash.  Neurological: Negative for dizziness, tremors, weakness, numbness and headaches.  Psychiatric/Behavioral: Negative for confusion, sleep disturbance and suicidal ideas.    Objective:  BP 128/82 (BP Location: Left Arm, Patient Position: Sitting, Cuff Size: Normal)   Pulse 80   Temp 98 F (36.7 C) (Oral)   Ht  (1.626 m)   Wt 171 lb (77.6 kg)   SpO2 98%   BMI 29.35 kg/m   BP Readings from Last 3 Encounters:  02/17/17 128/82  07/28/16 130/62  02/18/16 120/64    Wt Readings from Last 3 Encounters:  02/17/17 171 lb (77.6 kg)  07/28/16 166 lb (75.3 kg)  02/18/16 166 lb (75.3 kg)    Physical Exam  Constitutional: She appears well-developed. No distress.  HENT:  Head: Normocephalic.  Right Ear: External ear normal.  Left Ear: External ear normal.  Nose: Nose normal.  Mouth/Throat: Oropharynx is clear and moist.  Eyes: Conjunctivae are normal. Pupils are equal, round, and reactive to light. Right eye exhibits no discharge. Left eye exhibits no discharge.  Neck: Normal range of motion. Neck supple. No JVD present. No tracheal deviation present. No thyromegaly present.  Cardiovascular: Normal rate, regular rhythm and normal heart sounds.   Pulmonary/Chest: No stridor. No respiratory distress. She has no wheezes.  Abdominal: Soft. Bowel sounds are normal. She  exhibits no distension and no mass. There is no tenderness. There is no rebound and no guarding.  Musculoskeletal: She exhibits no edema or tenderness.  Lymphadenopathy:    She has no cervical adenopathy.  Neurological: She displays normal reflexes. No cranial nerve deficit. She exhibits normal muscle tone. Coordination normal.  Skin: No rash noted. No erythema.  Psychiatric: She has a normal mood and affect. Her behavior is normal. Judgment and thought content normal.    Lab Results  Component Value Date   WBC 9.1 02/13/2017   HGB 14.9 02/13/2017   HCT 44.0 02/13/2017   PLT 300.0  02/13/2017   GLUCOSE 95 02/13/2017   CHOL 177 07/21/2016   TRIG 90.0 07/21/2016   HDL 51.50 07/21/2016   LDLDIRECT 132.3 01/28/2009   LDLCALC 108 (H) 07/21/2016   ALT 9 02/13/2017   ALT 9 02/13/2017   AST 17 02/13/2017   AST 17 02/13/2017   NA 136 02/13/2017   K 3.8 02/13/2017   CL 100 02/13/2017   CREATININE 0.88 02/13/2017   BUN 13 02/13/2017   CO2 26 02/13/2017   TSH 0.73 02/13/2017    Ct Chest Wo Contrast  Result Date: 07/27/2015 CLINICAL DATA:  Productive cough for the past 5 weeks. Follow-up clinically diagnosed right lung pneumonia. EXAM: CT CHEST WITHOUT CONTRAST TECHNIQUE: Multidetector CT imaging of the chest was performed following the standard protocol without IV contrast. COMPARISON:  Chest radiographs dated 07/20/2015. FINDINGS: Small amount of patchy density in the right middle lobe. The remainder of the lungs are clear. No lung nodules or enlarged lymph nodes. Mild diffuse peribronchial thickening. Cholecystectomy clips. 6 mm oval area of low density in the spleen on image number 50. Mild thoracic spine degenerative changes. IMPRESSION: 1. Mild right middle lobe pneumonia. 2. Mild bronchitic changes. 3. 6 mm probable cyst or hemangioma in the spleen. Electronically Signed   By: Beckie Salts M.D.   On: 07/27/2015 08:49    Assessment & Plan:   There are no diagnoses linked to this encounter. I have discontinued Ms. Guida IRON PO, BIOTIN PO, clobetasol, naproxen sodium, and fluticasone. I am also having her maintain her aspirin, Ranitidine HCl (ZANTAC PO), Cholecalciferol, Acetaminophen (TYLENOL ARTHRITIS PAIN PO), citalopram, loratadine, and cyanocobalamin.  No orders of the defined types were placed in this encounter.    Follow-up: No Follow-up on file.  Sonda Primes, MD

## 2017-02-17 NOTE — Assessment & Plan Note (Signed)
transient impairment of awareness ?etiology x 10 min Neurol ref No driving if re-occurred ASA qd

## 2017-02-17 NOTE — Progress Notes (Signed)
Pre visit review using our clinic review tool, if applicable. No additional management support is needed unless otherwise documented below in the visit note. 

## 2017-03-02 DIAGNOSIS — Z961 Presence of intraocular lens: Secondary | ICD-10-CM | POA: Diagnosis not present

## 2017-03-27 ENCOUNTER — Other Ambulatory Visit: Payer: Self-pay | Admitting: Internal Medicine

## 2017-03-30 ENCOUNTER — Ambulatory Visit (INDEPENDENT_AMBULATORY_CARE_PROVIDER_SITE_OTHER): Payer: Medicare Other | Admitting: Neurology

## 2017-03-30 ENCOUNTER — Encounter: Payer: Self-pay | Admitting: Neurology

## 2017-03-30 VITALS — BP 140/80 | HR 84 | Ht 64.0 in | Wt 173.3 lb

## 2017-03-30 DIAGNOSIS — R413 Other amnesia: Secondary | ICD-10-CM

## 2017-03-30 NOTE — Progress Notes (Signed)
NEUROLOGY CONSULTATION NOTE  Allison BameDonna L Coe MRN: [id number] DOB: [birth year]  Referring provider: Dr. Posey ReaPlotnikov Primary care provider: Dr. Posey ReaPlotnikov  Reason for consult:  Memory loss  HISTORY OF PRESENT ILLNESS: [name] is a 66 year old right-handed female with hypertension, dyslipidemia, depression and IBS who presents for transient altered awareness.  History supplemented by PCP note.  In April, she was driving home from her sister's house.  She began to drive off route and didn't realize it until about a mile later, at which point she turned around and drove home.  She did not lose awareness and was not disoriented.  She just started driving in a different direction while on a familiar route.  There was no associated headache, nausea, dizziness, visual disturbance or unilateral numbness or weakness.  Her sister had been suffering from sciatica and so she was driving back and forth to her sister's place in order to help out.  She had been exhausted and had even taken a nap prior to driving home.  She reports that she sustained a concussion in 2013 when she tripped and fell, hitting her head on the sidewalk.  She hit the right frontal region, sustaining a black eye.  She felt slightly dizzy but she did not lose consciousness, feel confused, felt nauseous or vomited, noted double vision or have a headache.  At the time, she stayed out of work and rested for about a week.  Since then, she has noted some mild short-term memory issues, really only limited to frequently misplacing objects, which she is able to quickly retrieve.    Her father had dementia.  02/13/17 LABS: CBC with WBC 9.1, HGB 14.9, HCT 44, PLT 300; CMP with Na 136, K 3.8, Cl 100, CO2 26, glucose 95, BUN 13, Cr 0.88, total bili 0.8, ALP 77, AST 17 and ALT 9; TSH 0.73; UA with small leukocytes and elevated WBC but negative nitrite. 07/21/16 LABS: B12 491, TSH 1.22.  PAST MEDICAL HISTORY: Past Medical History:  Diagnosis  Date  . Depression   . Hyperlipidemia   . LBP (low back pain)   . OA (osteoarthritis)   . Vitamin B 12 deficiency     PAST SURGICAL HISTORY: Past Surgical History:  Procedure Laterality Date  . CHOLECYSTECTOMY  1990    MEDICATIONS: Current Outpatient Prescriptions on File Prior to Visit  Medication Sig Dispense Refill  . Acetaminophen (TYLENOL ARTHRITIS PAIN PO) Take by mouth as needed.    Marland Kitchen. aspirin 81 MG tablet Take 81 mg by mouth daily.      . Cholecalciferol 1000 UNITS capsule Take 1,000 Units by mouth daily.    . citalopram (CELEXA) 20 MG tablet TAKE 1 TABLET BY MOUTH DAILY. 90 tablet 1  . cyanocobalamin (,VITAMIN B-12,) 1000 MCG/ML injection USE AS DIRECTED EVERY 2 WEEKS 30 mL 1  . loratadine (CLARITIN) 10 MG tablet Take 1 tablet (10 mg total) by mouth daily. 30 tablet 3  . Ranitidine HCl (ZANTAC PO) Take by mouth as needed.       No current facility-administered medications on file prior to visit.     ALLERGIES: Allergies  Allergen Reactions  . Erythromycin Base Nausea Only  . Other Nausea And Vomiting and Other (See Comments)    Mycins-dizziness    FAMILY HISTORY: Family History  Problem Relation Age of Onset  . Pneumonia Father   . Diabetes Mother   . Heart disease Mother   . Hypertension Mother   . Pancreatic cancer Mother   .  Liver cancer Mother   . Diabetes Other   . Hypertension Other   . Colon cancer Maternal Grandmother 30    SOCIAL HISTORY: Social History   Social History  . Marital status: Single    Spouse name: N/A  . Number of children: 0  . Years of education: 12   Occupational History  . Not on file.   Social History Main Topics  . Smoking status: Never Smoker  . Smokeless tobacco: Never Used  . Alcohol use No  . Drug use: No  . Sexual activity: Not Currently   Other Topics Concern  . Not on file   Social History Narrative   Lives alone in a 1 1/2 story home.  No children.  Retired from [employer].  Education: high school.       REVIEW OF SYSTEMS: Constitutional: No fevers, chills, or sweats, no generalized fatigue, change in appetite Eyes: No visual changes, double vision, eye pain Ear, nose and throat: No hearing loss, ear pain, nasal congestion, sore throat Cardiovascular: No chest pain, palpitations Respiratory:  No shortness of breath at rest or with exertion, wheezes GastrointestinaI: No nausea, vomiting, diarrhea, abdominal pain, fecal incontinence Genitourinary:  No dysuria, urinary retention or frequency Musculoskeletal:  No neck pain, back pain Integumentary: No rash, pruritus, skin lesions Neurological: as above Psychiatric: No depression, insomnia, anxiety Endocrine: No palpitations, fatigue, diaphoresis, mood swings, change in appetite, change in weight, increased thirst Hematologic/Lymphatic:  No purpura, petechiae. Allergic/Immunologic: no itchy/runny eyes, nasal congestion, recent allergic reactions, rashes  PHYSICAL EXAM: Vitals:   03/30/17 1242  BP: 140/80  Pulse: 84   General: No acute distress.  Patient appears well-groomed.  Head:  Normocephalic/atraumatic Eyes:  fundi examined but not visualized Neck: supple, no paraspinal tenderness, full range of motion Back: No paraspinal tenderness Heart: regular rate and rhythm Lungs: Clear to auscultation bilaterally. Vascular: No carotid bruits. Neurological Exam: Mental status: alert and oriented to person, place, and time, delayed recall poor, remote memory intact, fund of knowledge intact, attention and concentration somewhat reduced, speech fluent and not dysarthric, language intact.  Unable to correctly complete Trail Making Test or copy a cube but was able to draw a clock (except forgot to include the number 11). Montreal Cognitive Assessment  03/30/2017  Visuospatial/ Executive (0/5) 2  Naming (0/3) 2  Attention: Read list of digits (0/2) 1  Attention: Read list of letters (0/1) 1  Attention: Serial 7 subtraction starting at 100  (0/3) 2  Language: Repeat phrase (0/2) 1  Language : Fluency (0/1) 1  Abstraction (0/2) 2  Delayed Recall (0/5) 1  Orientation (0/6) 6  Total 19  Adjusted Score (based on education) 20   Cranial nerves: CN I: not tested CN II: pupils equal, round and reactive to light, visual fields intact CN III, IV, VI:  full range of motion, no nystagmus, no ptosis CN V: facial sensation intact CN VII: upper and lower face symmetric CN VIII: hearing intact CN IX, X: gag intact, uvula midline CN XI: sternocleidomastoid and trapezius muscles intact CN XII: tongue midline Bulk & Tone: normal, no fasciculations. Motor:  5/5 throughout  Sensation: temperature and vibration sensation intact. Deep Tendon Reflexes:  2+ throughout, toes downgoing.  Finger to nose testing:  Without dysmetria.  Heel to shin:  Without dysmetria.  Gait:  Normal station and stride.  Able to turn and tandem walk. Romberg negative.  IMPRESSION: Memory loss.    PLAN: 1.  We will check neurocognitive testing. 2.  Further recommendations and follow up pending results.  If testing confirms cognitive impairment, we will pursue further testing such as head imaging and follow up to discuss management. 3.  In the meantime, I recommended optimizing sleep hygiene, exercising and following a Mediterranean diet.  Thank you for allowing me to take part in the care of this patient.  Shon Millet, DO  CC:  Jacinta Shoe, MD

## 2017-03-30 NOTE — Patient Instructions (Signed)
1.  We will check a more thorough neurocognitive test with Dr. Alinda DoomsBailar.  Follow up afterwards.  If testing is okay, we can always cancel the follow up appointment. 2.  Follow the sleep hygiene sheet provided 3.  Recommend exercise 4.  Recommend Mediterranean diet      Why follow it? Research shows. . Those who follow the Mediterranean diet have a reduced risk of heart disease  . The diet is associated with a reduced incidence of Parkinson's and Alzheimer's diseases . People following the diet may have longer life expectancies and lower rates of chronic diseases  . The Dietary Guidelines for Americans recommends the Mediterranean diet as an eating plan to promote health and prevent disease  What Is the Mediterranean Diet?  . Healthy eating plan based on typical foods and recipes of Mediterranean-style cooking . The diet is primarily a plant based diet; these foods should make up a majority of meals   Starches - Plant based foods should make up a majority of meals - They are an important sources of vitamins, minerals, energy, antioxidants, and fiber - Choose whole grains, foods high in fiber and minimally processed items  - Typical grain sources include wheat, oats, barley, corn, brown rice, bulgar, farro, millet, polenta, couscous  - Various types of beans include chickpeas, lentils, fava beans, black beans, white beans   Fruits  Veggies - Large quantities of antioxidant rich fruits & veggies; 6 or more servings  - Vegetables can be eaten raw or lightly drizzled with oil and cooked  - Vegetables common to the traditional Mediterranean Diet include: artichokes, arugula, beets, broccoli, brussel sprouts, cabbage, carrots, celery, collard greens, cucumbers, eggplant, kale, leeks, lemons, lettuce, mushrooms, okra, onions, peas, peppers, potatoes, pumpkin, radishes, rutabaga, shallots, spinach, sweet potatoes, turnips, zucchini - Fruits common to the Mediterranean Diet include: apples, apricots,  avocados, cherries, clementines, dates, figs, grapefruits, grapes, melons, nectarines, oranges, peaches, pears, pomegranates, strawberries, tangerines  Fats - Replace butter and margarine with healthy oils, such as olive oil, canola oil, and tahini  - Limit nuts to no more than a handful a day  - Nuts include walnuts, almonds, pecans, pistachios, pine nuts  - Limit or avoid candied, honey roasted or heavily salted nuts - Olives are central to the [employer]iterranean diet - can be eaten whole or used in a variety of dishes   Meats Protein - Limiting red meat: no more than a few times a month - When eating red meat: choose lean cuts and keep the portion to the size of deck of cards - Eggs: approx. 0 to 4 times a week  - Fish and lean poultry: at least 2 a week  - Healthy protein sources include, chicken, [place], lean beef, lamb - Increase intake of seafood such as tuna, salmon, trout, mackerel, shrimp, scallops - Avoid or limit high fat processed meats such as sausage and bacon  Dairy - Include moderate amounts of low fat dairy products  - Focus on healthy dairy such as fat free yogurt, skim milk, low or reduced fat cheese - Limit dairy products higher in fat such as whole or 2% milk, cheese, ice cream  Alcohol - Moderate amounts of red wine is ok  - No more than 5 oz daily for women (all ages) and men older than age 66  - No more than 10 oz of wine daily for men younger than 4165  Other - Limit sweets and other desserts  - Use herbs and spices instead of  salt to flavor foods  - Herbs and spices common to the traditional Mediterranean Diet include: basil, bay leaves, chives, cloves, cumin, fennel, garlic, lavender, marjoram, mint, oregano, parsley, pepper, rosemary, sage, savory, sumac, tarragon, thyme   It's not just a diet, it's a lifestyle:  . The Mediterranean diet includes lifestyle factors typical of those in the region  . Foods, drinks and meals are best eaten with others and savored . Daily  physical activity is important for overall good health . This could be strenuous exercise like running and aerobics . This could also be more leisurely activities such as walking, housework, yard-work, or taking the stairs . Moderation is the key; a balanced and healthy diet accommodates most foods and drinks . Consider portion sizes and frequency of consumption of certain foods   Meal Ideas & Options:  . Breakfast:  o Whole wheat toast or whole wheat English muffins with peanut butter & hard boiled egg o Steel cut oats topped with apples & cinnamon and skim milk  o Fresh fruit: banana, strawberries, melon, berries, peaches  o Smoothies: strawberries, bananas, greek yogurt, peanut butter o Low fat greek yogurt with blueberries and granola  o Egg white omelet with spinach and mushrooms o Breakfast couscous: whole wheat couscous, apricots, skim milk, cranberries  . Sandwiches:  o Hummus and grilled vegetables (peppers, zucchini, squash) on whole wheat bread   o Grilled chicken on whole wheat pita with lettuce, tomatoes, cucumbers or tzatziki  o Tuna salad on whole wheat bread: tuna salad made with greek yogurt, olives, red peppers, capers, green onions o Garlic rosemary lamb pita: lamb sauted with garlic, rosemary, salt & pepper; add lettuce, cucumber, greek yogurt to pita - flavor with lemon juice and black pepper  . Seafood:  o Mediterranean grilled salmon, seasoned with garlic, basil, parsley, lemon juice and black pepper o Shrimp, lemon, and spinach whole-grain pasta salad made with low fat greek yogurt  o Seared scallops with lemon orzo  o Seared tuna steaks seasoned salt, pepper, coriander topped with tomato mixture of olives, tomatoes, olive oil, minced garlic, parsley, green onions and cappers  . Meats:  o Herbed greek chicken salad with kalamata olives, cucumber, feta  o Red bell peppers stuffed with spinach, bulgur, lean ground beef (or lentils) & topped with feta   o Kebabs:  skewers of chicken, tomatoes, onions, zucchini, squash  o [place] burgers: made with red onions, mint, dill, lemon juice, feta cheese topped with roasted red peppers . Vegetarian o Cucumber salad: cucumbers, artichoke hearts, celery, red onion, feta cheese, tossed in olive oil & lemon juice  o Hummus and whole grain pita points with a greek salad (lettuce, tomato, feta, olives, cucumbers, red onion) o Lentil soup with celery, carrots made with vegetable broth, garlic, salt and pepper  o Tabouli salad: parsley, bulgur, mint, scallions, cucumbers, tomato, radishes, lemon juice, olive oil, salt and pepper.

## 2017-05-18 ENCOUNTER — Ambulatory Visit (INDEPENDENT_AMBULATORY_CARE_PROVIDER_SITE_OTHER): Payer: Medicare Other | Admitting: Psychology

## 2017-05-18 DIAGNOSIS — R413 Other amnesia: Secondary | ICD-10-CM

## 2017-05-18 NOTE — Progress Notes (Signed)
   Neuropsychology Note  [name] came in today for 2 hours of neuropsychological testing with technician, [name], BS, under the supervision of [name]. The patient did not appear overtly distressed by the testing session, per behavioral observation or via self-report to the technician. Rest breaks were offered. [name] will return within 2 weeks for a feedback session with [name] at which time her test performances, clinical impressions and treatment recommendations will be reviewed in detail. The patient understands she can contact our office should she require our assistance before this time.  Full report to follow.

## 2017-05-18 NOTE — Progress Notes (Signed)
NEUROPSYCHOLOGICAL INTERVIEW (CPT: [id number])  Name: [name] Date of Birth: [birth year] Date of Interview: 05/18/2017  Reason for Referral:  [name] is a 66 y.o. right-handed female who is referred for neuropsychological evaluation by Dr. Shon MilletAdam Jaffe of [facility] due to concerns about memory loss/transient altered awareness. This patient is accompanied in the office by her sister who supplements the history.  History of Presenting Problem:  [name] was seen for neurologic consultation by Dr. Everlena CooperJaffe on 03/30/2017 due to a history of one recent episode in April when she was driving home and drove off route for about a mile before realizing it, at which point she turned around and drove home. She did not lose awareness and was not disoriented. There was no associated headache, nausea, dizziness, visual disturbance or unilateral numbness or weakness.   At today's visit the patient reported she has had one more similar episode since then, in June. She was driving with her nephew to pick up a birthday cake from a bakery and again kept driving passed where she was supposed to go but upon realizing they were able to find their way back.   When asked about any memory decline on a more regular basis, she stated she may be experiencing "a little". She does feel her cognitive functioning has changed since retiring on March 1 of this year.  Of note, the patient's sister reported that she has noticed a change in the patient's memory over the past several months which has been concerning to her. She reported the patient frequently asks the same thing over and over. Her sister was very upset to admit this in front of the patient.   However, they both denied the following symptoms: forgetting appointments or other obligations, forgetting to take medications, forgetting to pay bills, difficulty concentrating, distractibility, processing information more slowly, word finding difficulty, or  comprehension difficulty.  Other relevant history: Family history is significant for dementia in her late father.  The patient has a history of possible concussion about a month ago when she was hit in the head with a baseball while playing baseball with her great-nephew. She did not lose consciousness but did have a bad headache afterwards. She did not seek medical attention. She also has a history of a fall in 2013 wherein she hit her head, but she had no concussion symptoms afterwards.  Psychiatric history:  Psychiatric history was denied. She did experience grief related depression when her parents died in 2010 and 2012. She was put on Celexa around that time, which she continues to take. She did grief counseling through hospice. These treatments were effective and she reports good mood presently. Her sister agrees her mood has been stable and she does not appear depressed. The patient denies past or present suicidal ideation.  Current Functioning: The patient denies any physical complaints at the present time. She denies any chronic pain. She did note she sometimes has difficulty getting sleep, but once she gets to sleep she sleeps well. She does not take any over the counter sleep aids. She denies daily fatigue. She takes B12 injections.   She lives alone in her own home. She manages all instrumental ADLs independently. As noted previously, she retired recently (March 1). She watches her great niece and great nephew (ages 336 and 164) during the summer, and she enjoys this.   She denies any current stressors.    Social History: Born/Raised: 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 Depression   . Hyperlipidemia   . LBP (low back pain)   . OA (osteoarthritis)   . Vitamin B 12 deficiency       Current Medications:  Outpatient Encounter Prescriptions as of 05/18/2017  Medication Sig  . Acetaminophen (TYLENOL ARTHRITIS PAIN PO) Take by mouth as needed.  Marland Kitchen. aspirin 81 MG tablet Take 81 mg by mouth daily.    . Cholecalciferol 1000 UNITS capsule Take 1,000 Units by mouth daily.  . citalopram (CELEXA) 20 MG tablet TAKE 1 TABLET BY MOUTH DAILY.  . cyanocobalamin (,VITAMIN B-12,) 1000 MCG/ML injection USE AS DIRECTED EVERY 2 WEEKS  . loratadine (CLARITIN) 10 MG tablet Take 1 tablet (10 mg total) by mouth daily.  . Ranitidine HCl (ZANTAC PO) Take by mouth as needed.     No facility-administered encounter medications on file as of 05/18/2017.      Behavioral Observations:  Appearance: Neatly, casually and appropriately dressed and groomed Gait: Ambulated independently, no gross abnormalities observed Speech: Fluent; normal rate, rhythm and volume. No significant word finding difficulty observed during conversational speech. Thought process: Linear, goal directed Affect: Mildly blunted, euthymic Interpersonal: Pleasant, appropriate   TESTING: There is medical necessity to proceed with neuropsychological assessment as the results will be used to aid in differential diagnosis and clinical decision-making and to inform specific treatment recommendations. Per the patient, her sister and medical records reviewed, there has been a change in cognitive functioning and a reasonable suspicion of neurocognitive disorder.  Following the clinical interview, the patient completed a full battery of neuropsychological testing with my psychometrician under my supervision.   PLAN: The patient will return to see me for a follow-up session at which time her test performances and my impressions and treatment recommendations will be reviewed in detail.  Full report to  follow.

## 2017-05-20 ENCOUNTER — Encounter: Payer: Self-pay | Admitting: Psychology

## 2017-06-09 NOTE — Progress Notes (Signed)
NEUROPSYCHOLOGICAL EVALUATION   Name:    [name]  Date of Birth:   05/27/1951 Date of Interview:  05/18/2017 Date of Testing:  05/18/2017   Date of Feedback:  06/13/2017       Background Information:  Reason for Referral:  [name] is a 66 y.o. right handed female referred by Dr. Shon Millet to assess her current level of cognitive functioning and assist in differential diagnosis. The current evaluation consisted of a review of available medical records, an interview with the patient and her sister, and the completion of a neuropsychological testing battery. Informed consent was obtained.  History of Presenting Problem:  [name] was seen for neurologic consultation by Dr. Everlena Cooper on 03/30/2017 due to a history of one recent episode in April when she was driving home and drove off route for about a mile before realizing it, at which point she turned around and drove home. She did not lose awareness and was not disoriented. There was no associated headache, nausea, dizziness, visual disturbance or unilateral numbness or weakness.   At today's visit the patient reported she has had one more similar episode since then, in June. She was driving with her nephew to pick up a birthday cake from a bakery and again kept driving passed where she was supposed to go but upon realizing they were able to find their way back.   When asked about any memory decline on a more regular basis, she stated she may be experiencing "a little". She does feel her cognitive functioning has changed since retiring on March 1 of this year.  Of note, the patient's sister reported that she has noticed a change in the patient's memory over the past several months which has been concerning to her. She reported the patient frequently asks the same thing over and over. Her sister was very upset to admit this in front of the patient.   However, they both denied the following symptoms: forgetting appointments  or other obligations, forgetting to take medications, forgetting to pay bills, difficulty concentrating, distractibility, processing information more slowly, word finding difficulty, or comprehension difficulty.  Other relevant history: Family history is significant for dementia in her late father.  The patient has a history of possible concussion about a month ago when she was hit in the head with a baseball while playing baseball with her great-nephew. She did not lose consciousness but did have a bad headache afterwards. She did not seek medical attention. She also has a history of a fall in 03/14/12 wherein she hit her head, but she had no concussion symptoms afterwards.  Psychiatric history:  Psychiatric history was denied. 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 She was put on Celexa around that time, which she continues to take. She did grief counseling through hospice. These treatments were effective and she reports good mood presently. Her sister agrees her mood has been stable and she does not appear depressed. The patient denies past or present suicidal ideation.  Current Functioning: The patient denies any physical complaints at the present time. She denies any chronic pain. She did note she sometimes has difficulty getting sleep, but once she gets to sleep she sleeps well. She does not take any over the counter sleep aids. She denies daily fatigue. She takes B12 injections.   She lives alone in her own home. She manages all instrumental ADLs independently. As noted previously, she retired recently (March 1).  She watches her great niece and great nephew (ages 42 and 75) during the summer, and she enjoys this.   She denies any current stressors.    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 Depression   . Hyperlipidemia   . LBP (low back pain)   . OA (osteoarthritis)   . Vitamin B 12 deficiency     Current medications:  Outpatient Encounter Prescriptions as of 06/13/2017  Medication Sig  . Acetaminophen (TYLENOL ARTHRITIS PAIN PO) Take by mouth as needed.  Marland Kitchen aspirin 81 MG tablet Take 81 mg by mouth daily.    . Cholecalciferol 1000 UNITS capsule Take 1,000 Units by mouth daily.  . citalopram (CELEXA) 20 MG tablet TAKE 1 TABLET BY MOUTH DAILY.  . cyanocobalamin (,VITAMIN B-12,) 1000 MCG/ML injection USE AS DIRECTED EVERY 2 WEEKS  . loratadine (CLARITIN) 10 MG tablet Take 1 tablet (10 mg total) by mouth daily.  . Ranitidine HCl (ZANTAC PO) Take by mouth as needed.     No facility-administered encounter medications on file as of 06/13/2017.      Current Examination:  Behavioral Observations:  Appearance: Neatly, casually and appropriately dressed and groomed Gait: Ambulated independently, no gross abnormalities observed Speech: Fluent; normal rate, rhythm and volume. No significant word finding difficulty observed during conversational speech. Thought process: Linear, goal directed Affect: Mildly blunted, euthymic Interpersonal: Pleasant, appropriate Orientation: Oriented to person, place and most aspects of time (5 days off on the current date). Accurately named the current President and his predecessor.   Tests Administered: . Test of Premorbid Functioning (TOPF) . Wechsler Adult Intelligence Scale-Fourth Edition (WAIS-IV): Similarities, Arithmetic, Matrix Reasoning, Block Design, Coding and Digit Span subtests . Wechsler Memory Scale-Fourth Edition (WMS-IV) Older Adult Version (ages 75-90): Logical Memory I, II and Recognition subtests  . [place] Verbal  Learning Test - 2nd Edition (CVLT-2) Standard Form . Repeatable Battery for the Assessment of Neuropsychological Status (RBANS) Form A:  Figure Copy and Recall subtests and Semantic Fluency subtest . Neuropsychological Assessment Battery (NAB) Language Module, Form 1: Naming subtest . [place] Diagnostic Aphasia Examination: Complex Ideational Material subtest . Controlled Oral Word Association Test (COWAT) . Trail Making Test A and B . Clock drawing test . [facility]) . Geriatric Depression Scale (GDS) 15 Item . Generalized Anxiety Disorder - 7 item screener (GAD-7)  Test Results: Note: Standardized scores are presented only for use by appropriately trained professionals and to allow for any future test-retest comparison. These scores should not be interpreted without consideration of all the information that is contained in the rest of the report. The most recent standardization samples from the test publisher or other sources were used whenever possible to derive standard scores; scores were corrected for age, gender, ethnicity and education when available.   Test Scores:  Test Name Raw Score Standardized Score Descriptor  TOPF 33/70 SS= 93 Average  WAIS-IV Subtests     Similarities 25/36 ss= 10 Average  Arithmetic 11/22 ss= 8 Average  Matrix Reasoning 9/26 ss= 7 Low average  Block Design 30/66 ss= 9 Average  Coding 60/135 ss= 11 Average  Digit Span  24/48 ss= 9  Average  WMS-IV Subtests     LM I 17/53 ss= 5 Borderline  LM II 3/39 ss= 3 Impaired  LM II Recognition 12/23 Cum %: <2 Impaired  RBANS Subtests     Figure Copy 18/20 Z= -0.1 Average  Figure Recall 5/20 Z= -2.2 Impaired  Semantic Fluency 13/40 Z= -1.7 Borderline  CVLT-II Scores     Trial 1 2/16 Z= -2.5 Impaired  Trial 5 5/16 Z= -3 Severely impaired  Trials 1-5 total 17/80 T= 20 Severely impaired  SD Free Recall 1/16 Z= -3 Severely impaired  SD Cued Recall 2/16 Z= -3.5 Severely impaired  LD Free  Recall 1/16 Z= -3 Severely impaired  LD Cued Recall 1/16 Z= -3.5 Severely impaired  Recognition Discriminability 15/16 hits, 17 false positives Z= -2 Impaired  Forced Choice Recognition 16/16  WNL  NAB Language subtest     Naming 31/31 T= 57 High average  BDAE Subtest     Complex Ideational Material 9/12  Impaired  COWAT-FAS 37 T= 46 Average  COWAT-Animals 15 T= 42 Low average  Trail Making Test A  58" 0 errors T= 31 Borderline  Trail Making Test B  116" 2 errors T= 41 Low average  Clock Drawing   Impaired  WCST     Total Errors 37 T= 29 Impaired  Perseverative Responses 14 T= 42 Low average  Perseverative Errors 14 T= 40 Low average  Conceptual Level Responses 13 T= 29 Impaired  Categories Completed 1 6-10% Below expectation  Trials to Complete 1st Category 42 6-10% Below expectation  Failure to Maintain Set 0  WNL  GDS-15 3/15  WNL  GAD-7 1/21  WNL      Description of Test Results:  Premorbid verbal intellectual abilities were estimated to have been within the average range based on a test of word reading. Psychomotor processing speed was average. Auditory attention and working memory were average. Visual-spatial construction was average. Language abilities were variable. Specifically, confrontation naming was high average, and semantic verbal fluency was low average to borderline impaired. Auditory comprehension of complex ideational material was impaired. With regard to verbal memory, encoding and acquisition of non-contextual information (i.e., word list) was severely impaired across five learning trials. After an interference task, free recall was severely impaired (1/16 items recalled). She benefited very minimally from semantic cueing (2/16 items recalled, severely impaired). After a delay, free recall was severely impaired (1/16 items recalled). Cued recall was severely impaired (1/16 items recalled). Performance on a yes/no recognition task was severely impaired due to an  extremely elevated number of false positive responses. However, performance on a forced choice recognition task was intact, suggesting good effort. On another verbal memory test, encoding and acquisition of contextual auditory information (i.e., short stories) was borderline impaired. After a delay, free recall was impaired (recalling only 3 or 39 story features). Performance on a yes/no recognition task was severely impaired, essentially at chance level. With regard to non-verbal memory, delayed free recall of visual information was impaired. Executive functioning was variable. Mental flexibility and set-shifting were low average on Trails B. Verbal fluency with phonemic search restrictions was average. Verbal abstract reasoning was average. Non-verbal abstract reasoning was low average. Deductive reasoning and problem solving was impaired. Performance on a clock drawing task was impaired. On self-report measures of mood, the patient's responses were not indicative of clinically significant depression or anxiety at the present time.    Clinical Impressions: Amnestic mild cognitive impairment (rule out prodromal Alzheimer's disease versus cognitive disorder due  to B12 deficiency). Results of cognitive testing revealed significant deficits in memory encoding and consolidation (for both visual and auditory information, and both contextual and non-contextual information). Additionally, she demonstrated impairments in semantic verbal fluency, deductive reasoning, and auditory comprehension of complex ideational material. Meanwhile, visual-spatial construction, attention, processing speed, some aspects of executive functioning, and confrontation naming were all intact. Because the patient continues to manage all instrumental ADLs without any reported difficulty, she does not meet diagnostic criteria for dementia. However, a diagnosis of mild cognitive impairment is certainly warranted, and given her cognitive profile  I am concerned about underlying Alzheimer's disease. There is no evidence of mood disturbance or primary psychiatric disorder. At the patient's follow-up session on 06/13/2017, she admitted that she had not been taking her B12 injection for several months (she had previously reported she was taking it), but after reading about the implication of B12 deficiency she recently restarted it. Because her B12 levels were unknown at the time of evaluation and because she was not taking injections, it is also possible that her cognitive deficits were due to vitamin deficiency.    Recommendations/Plan: Based on the findings of the present evaluation, the following recommendations are offered:  1. Brain MRI is recommended to rule out more acute etiology.  2. Financial and healthcare PoA as well as having her sister look over medications/finances periodically to ensure mistakes are not made is recommended. 3. Vitamin B12 level should be checked again. The patient was strongly encouraged to continue taking her injections as directed. Her sister will support her in this. Originally, when I thought she was compliant with injections at the time of cognitive evaluation, I had low suspicion of cognitive impairment due to B12 deficiency, but with this new information that cannot be ruled out. 4. I educated the patient and her sister on MCI and provided additional written information. 5. Neuropsychological re-evaluation in one year.   Feedback to Patient: BRAELYNNE GARINGER and her sister returned for a feedback appointment on 06/13/2017 to review the results of her neuropsychological evaluation with this provider. 30 minutes face-to-face time was spent reviewing her test results, my impressions and my recommendations as detailed above.    Total time spent on this patient's case: [id number] unit for interview with psychologist; [id number] units of testing by psychometrician under psychologist's supervision; [phone] units for  medical record review, scoring of neuropsychological tests, interpretation of test results, preparation of this report, and review of results to the patient by psychologist.      Thank you for your referral of FRANCYS BOLIN. Please feel free to contact me if you have any questions or concerns regarding this report.

## 2017-06-13 ENCOUNTER — Encounter: Payer: Self-pay | Admitting: Psychology

## 2017-06-13 ENCOUNTER — Telehealth: Payer: Self-pay | Admitting: Internal Medicine

## 2017-06-13 ENCOUNTER — Ambulatory Visit (INDEPENDENT_AMBULATORY_CARE_PROVIDER_SITE_OTHER): Payer: Medicare Other | Admitting: Psychology

## 2017-06-13 ENCOUNTER — Ambulatory Visit: Payer: BC Managed Care – PPO

## 2017-06-13 DIAGNOSIS — R413 Other amnesia: Secondary | ICD-10-CM | POA: Diagnosis not present

## 2017-06-13 DIAGNOSIS — E538 Deficiency of other specified B group vitamins: Secondary | ICD-10-CM

## 2017-06-13 DIAGNOSIS — G3184 Mild cognitive impairment, so stated: Secondary | ICD-10-CM

## 2017-06-13 NOTE — Telephone Encounter (Signed)
LM for pt to schedule a Welcome to Medicare visit with Dr Posey Rea before October 1st. (the AWV would need to be canceled if she schedules a Welcome to Medicare visit)

## 2017-06-13 NOTE — Addendum Note (Signed)
Addended by: Dorthy Cooler on: 06/13/2017 03:16 PM   Modules accepted: Orders

## 2017-06-14 ENCOUNTER — Ambulatory Visit: Payer: BC Managed Care – PPO

## 2017-06-14 ENCOUNTER — Other Ambulatory Visit: Payer: Medicare Other

## 2017-06-14 ENCOUNTER — Telehealth: Payer: Self-pay

## 2017-06-14 DIAGNOSIS — E538 Deficiency of other specified B group vitamins: Secondary | ICD-10-CM | POA: Diagnosis not present

## 2017-06-14 NOTE — Telephone Encounter (Signed)
Pt telephoned office to confirm she can come by today for B12 level. Advsd her to check in with our reception, then proceed to 2nd floor for labs.

## 2017-06-15 LAB — VITAMIN B12: Vitamin B-12: 755 pg/mL (ref 200–1100)

## 2017-07-12 ENCOUNTER — Emergency Department (HOSPITAL_BASED_OUTPATIENT_CLINIC_OR_DEPARTMENT_OTHER)
Admission: EM | Admit: 2017-07-12 | Discharge: 2017-07-13 | Disposition: A | Discharge status: Home / Self Care | Payer: Medicare Other | Attending: Emergency Medicine | Admitting: Emergency Medicine

## 2017-07-12 ENCOUNTER — Encounter (HOSPITAL_BASED_OUTPATIENT_CLINIC_OR_DEPARTMENT_OTHER): Payer: Self-pay

## 2017-07-12 DIAGNOSIS — Y9301 Activity, walking, marching and hiking: Secondary | ICD-10-CM | POA: Insufficient documentation

## 2017-07-12 DIAGNOSIS — Z7982 Long term (current) use of aspirin: Secondary | ICD-10-CM | POA: Insufficient documentation

## 2017-07-12 DIAGNOSIS — S0181XA Laceration without foreign body of other part of head, initial encounter: Secondary | ICD-10-CM | POA: Diagnosis not present

## 2017-07-12 DIAGNOSIS — S0083XA Contusion of other part of head, initial encounter: Secondary | ICD-10-CM | POA: Diagnosis not present

## 2017-07-12 DIAGNOSIS — Z79899 Other long term (current) drug therapy: Secondary | ICD-10-CM | POA: Insufficient documentation

## 2017-07-12 DIAGNOSIS — Y999 Unspecified external cause status: Secondary | ICD-10-CM | POA: Insufficient documentation

## 2017-07-12 DIAGNOSIS — W0110XA Fall on same level from slipping, tripping and stumbling with subsequent striking against unspecified object, initial encounter: Secondary | ICD-10-CM | POA: Diagnosis not present

## 2017-07-12 DIAGNOSIS — S0990XA Unspecified injury of head, initial encounter: Secondary | ICD-10-CM | POA: Diagnosis not present

## 2017-07-12 DIAGNOSIS — S51011A Laceration without foreign body of right elbow, initial encounter: Secondary | ICD-10-CM | POA: Insufficient documentation

## 2017-07-12 DIAGNOSIS — S51019A Laceration without foreign body of unspecified elbow, initial encounter: Secondary | ICD-10-CM

## 2017-07-12 DIAGNOSIS — S01111A Laceration without foreign body of right eyelid and periocular area, initial encounter: Secondary | ICD-10-CM | POA: Diagnosis not present

## 2017-07-12 DIAGNOSIS — S199XXA Unspecified injury of neck, initial encounter: Secondary | ICD-10-CM | POA: Diagnosis not present

## 2017-07-12 DIAGNOSIS — Y929 Unspecified place or not applicable: Secondary | ICD-10-CM | POA: Insufficient documentation

## 2017-07-12 NOTE — ED Triage Notes (Addendum)
Pt states she tripped/fell on concrete floor approx 915pm-abrasion to right side of face, right elbow-bruising to bilat knees-small lac above right eye-gauze taped in place-no LOC with fall-steady gait-NAD-pt has own ice pack

## 2017-07-13 ENCOUNTER — Emergency Department (HOSPITAL_BASED_OUTPATIENT_CLINIC_OR_DEPARTMENT_OTHER): Payer: Medicare Other

## 2017-07-13 ENCOUNTER — Encounter (HOSPITAL_BASED_OUTPATIENT_CLINIC_OR_DEPARTMENT_OTHER): Payer: Self-pay | Admitting: Emergency Medicine

## 2017-07-13 DIAGNOSIS — S0181XA Laceration without foreign body of other part of head, initial encounter: Secondary | ICD-10-CM | POA: Diagnosis not present

## 2017-07-13 DIAGNOSIS — S199XXA Unspecified injury of neck, initial encounter: Secondary | ICD-10-CM | POA: Diagnosis not present

## 2017-07-13 DIAGNOSIS — S0990XA Unspecified injury of head, initial encounter: Secondary | ICD-10-CM | POA: Diagnosis not present

## 2017-07-13 MED ORDER — ACETAMINOPHEN 500 MG PO TABS
1000.0000 mg | ORAL_TABLET | Freq: Once | ORAL | Status: AC
Start: 2017-07-13 — End: 2017-07-13
  Administered 2017-07-13: 1000 mg via ORAL
  Filled 2017-07-13: qty 2

## 2017-07-13 MED ORDER — BACITRACIN ZINC 500 UNIT/GM EX OINT
TOPICAL_OINTMENT | Freq: Two times a day (BID) | CUTANEOUS | Status: DC
  Administered 2017-07-13: 1 via TOPICAL
  Filled 2017-07-13: qty 28.35

## 2017-07-13 NOTE — ED Provider Notes (Addendum)
[facility] Provider Note   CSN: [id number] Arrival date & time: 07/12/17  2132     History   Chief Complaint Chief Complaint  Patient presents with  . Fall    HPI [name] is a 66 y.o. female.  The history is provided by the patient.  Fall  This is a new problem. The current episode started 3 to 5 hours ago. The problem occurs constantly. The problem has not changed since onset.Pertinent negatives include no chest pain. Nothing aggravates the symptoms. Nothing relieves the symptoms. She has tried nothing for the symptoms. The treatment provided no relief.    Past Medical History:  Diagnosis Date  . Depression   . Hyperlipidemia   . LBP (low back pain)   . OA (osteoarthritis)   . Vitamin B 12 deficiency     Patient Active Problem List   Diagnosis Date Noted  . Altered consciousness 02/17/2017  . Cough 07/28/2016  . Alopecia areata 05/30/2014  . Pneumonia due to organism 05/05/2014  . Tendinopathy of left biceps tendon 10/04/2013  . Trigger finger, acquired 10/04/2013  . Left shoulder pain 09/03/2013  . 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 Onychomycosis of toenail 01/22/2013  . Paronychia of great toe of left foot 01/21/2013  . Abdominal tenderness, LUQ (left upper quadrant) 09/09/2011  . IBS (irritable bowel syndrome) 09/09/2011  . Acute sialoadenitis 02/28/2011  . Well adult exam 02/28/2011  . SKIN RASH 09/03/2010  . ABDOMINAL PAIN, LOWER 02/02/2009  . Essential hypertension 09/26/2008  . ELEVATED BLOOD PRESSURE WITHOUT DIAGNOSIS OF HYPERTENSION 09/12/2008  . CONTACT DERMATITIS&OTHER ECZEMA DUE TO PLANTS 02/13/2008  . B12 deficiency 11/13/2007  . Dyslipidemia 11/13/2007  . LOW BACK PAIN 11/13/2007  . NONSPECIFIC ABNORMAL RESULTS LIVR FUNCTION STUDY 11/13/2007  . Chronic depression 07/14/2007  . OSTEOARTHRITIS 07/14/2007    Past Surgical History:  Procedure Laterality Date  . CHOLECYSTECTOMY  1990    OB History    No  data available       Home Medications    Prior to Admission medications   Medication Sig Start Date End Date Taking? Authorizing Provider  Acetaminophen (TYLENOL ARTHRITIS PAIN PO) Take by mouth as needed.    [provider]  aspirin 81 MG tablet Take 81 mg by mouth daily.      [provider]  Cholecalciferol 1000 UNITS capsule Take 1,000 Units by mouth daily.    [provider]  citalopram (CELEXA) 20 MG tablet TAKE 1 TABLET BY MOUTH DAILY. 03/27/17   Plotnikov, Georgina Quint, MD  cyanocobalamin (,VITAMIN B-12,) 1000 MCG/ML injection USE AS DIRECTED EVERY 2 WEEKS 11/28/16   Plotnikov, Georgina Quint, MD  loratadine (CLARITIN) 10 MG tablet Take 1 tablet (10 mg total) by mouth daily. 07/28/16 07/28/17  Plotnikov, Georgina Quint, MD  Ranitidine HCl (ZANTAC PO) Take by mouth as needed.      [provider]    Family History Family History  Problem Relation Age of Onset  . Pneumonia Father   . Diabetes Mother   . Heart disease Mother   . Hypertension Mother   . Pancreatic cancer Mother   . Liver cancer Mother   . Diabetes Other   . Hypertension Other   . Colon cancer Maternal Grandmother 68    Social History Social History  Substance Use Topics  . Smoking status: Never Smoker  . Smokeless tobacco: Never Used  . Alcohol use No     Allergies   Erythromycin base and  Other   Review of Systems Review of Systems  Eyes: Negative for visual disturbance.  Cardiovascular: Negative for chest pain.  Musculoskeletal: Negative for neck pain and neck stiffness.  Skin: Positive for wound.  Neurological: Negative for weakness.  All other systems reviewed and are negative.    Physical Exam Updated Vital Signs BP (!) 157/83 (BP Location: Left Arm)   Pulse 96   Temp 98.2 F (36.8 C) (Oral)   Resp 18   Ht  (1.6 m)   Wt 81.6 kg (180 lb)   SpO2 98%   BMI 31.89 kg/m   Physical Exam  Constitutional: She is oriented to person, place, and time. She appears  well-developed and well-nourished.  HENT:  Head: Normocephalic. Head is without raccoon's eyes and without Battle's sign.    Right Ear: No hemotympanum.  Left Ear: No hemotympanum.  Nose: Nose normal.  Mouth/Throat: No oropharyngeal exudate.  Eyes: Pupils are equal, round, and reactive to light. Conjunctivae are normal.  Neck: Normal range of motion. Neck supple.  Cardiovascular: Normal rate, regular rhythm, normal heart sounds and intact distal pulses.   Pulmonary/Chest: Effort normal and breath sounds normal.  Abdominal: Soft. Bowel sounds are normal. She exhibits no mass. There is no tenderness. There is no guarding.  Musculoskeletal: Normal range of motion. She exhibits no tenderness or deformity.  Skin tear R olecranon, FROm non tender  Neurological: She is alert and oriented to person, place, and time. She displays normal reflexes.  Skin: Skin is warm and dry. Capillary refill takes less than 2 seconds.  Psychiatric: She has a normal mood and affect.     ED Treatments / Results   Vitals:   07/12/17 2146  BP: (!) 157/83  Pulse: 96  Resp: 18  Temp: 98.2 F (36.8 C)  SpO2: 98%    Radiology Ct Head Wo Contrast  Result Date: 07/13/2017 CLINICAL DATA:  Patient fell yesterday striking right side of the face. Pain right superior orbital rim. No neck pain. EXAM: CT HEAD WITHOUT CONTRAST CT CERVICAL SPINE WITHOUT CONTRAST TECHNIQUE: Multidetector CT imaging of the head and cervical spine was performed following the standard protocol without intravenous contrast. Multiplanar CT image reconstructions of the cervical spine were also generated. COMPARISON:  None. FINDINGS: CT HEAD FINDINGS Brain: Mild diffuse cerebral atrophy. Scattered patchy low-attenuation changes in the deep white matter consistent with small vessel ischemia. No evidence of acute infarction, hemorrhage, hydrocephalus, extra-axial collection or mass lesion/mass effect. Vascular: No hyperdense vessel or unexpected  calcification. Skull: The calvarium appears intact. Sinuses/Orbits: Paranasal sinuses and mastoid air cells are clear. 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 Normal variation. CT CERVICAL SPINE FINDINGS Alignment: Normal alignment of the cervical vertebrae and facet joints. C1-2 articulation appears intact. Skull base and vertebrae: No vertebral compression deformities. No focal bone lesion or bone destruction. Bone cortex appears intact. Congenital nonunion of the posterior arch of C1, normal variation. Soft tissues and spinal canal: No prevertebral soft tissue swelling. No paraspinal infiltration. Disc levels: Degenerative changes throughout the cervical spine with narrowed interspaces and endplate hypertrophic changes. Degenerative changes are most prominent at C5-6 and C6-7 levels. Upper chest: Lung apices are clear. Other: None. IMPRESSION: No acute intracranial abnormalities.  Mild diffuse cerebral atrophy. Normal alignment of the cervical spine. Degenerative changes. No acute displaced fractures identified. Electronically Signed   By: Burman Nieves M.D.   On: 07/13/2017 01:34   Ct Cervical Spine Wo Contrast  Result Date: 07/13/2017 CLINICAL DATA:  Patient fell yesterday striking right side of the face. Pain right superior orbital rim. No neck pain. EXAM: CT HEAD WITHOUT CONTRAST CT CERVICAL SPINE WITHOUT CONTRAST TECHNIQUE: Multidetector CT imaging of the head and cervical spine was performed following the standard protocol without intravenous contrast. Multiplanar CT image reconstructions of the cervical spine were also generated. COMPARISON:  None. FINDINGS: CT HEAD FINDINGS Brain: Mild diffuse cerebral atrophy. Scattered patchy low-attenuation changes in the deep white matter consistent with small vessel ischemia. No evidence of acute infarction, hemorrhage, hydrocephalus, extra-axial collection or mass lesion/mass effect. Vascular: No hyperdense vessel or unexpected calcification.  Skull: The calvarium appears intact. Sinuses/Orbits: Paranasal sinuses and mastoid air cells are clear. 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 Normal variation. CT CERVICAL SPINE FINDINGS Alignment: Normal alignment of the cervical vertebrae and facet joints. C1-2 articulation appears intact. Skull base and vertebrae: No vertebral compression deformities. No focal bone lesion or bone destruction. Bone cortex appears intact. Congenital nonunion of the posterior arch of C1, normal variation. Soft tissues and spinal canal: No prevertebral soft tissue swelling. No paraspinal infiltration. Disc levels: Degenerative changes throughout the cervical spine with narrowed interspaces and endplate hypertrophic changes. Degenerative changes are most prominent at C5-6 and C6-7 levels. Upper chest: Lung apices are clear. Other: None. IMPRESSION: No acute intracranial abnormalities.  Mild diffuse cerebral atrophy. Normal alignment of the cervical spine. Degenerative changes. No acute displaced fractures identified. Electronically Signed   By: Burman Nieves M.D.   On: 07/13/2017 01:34    Procedures .Marland KitchenLaceration Repair Date/Time: 07/13/2017 1:52 AM Performed by: Cy Blamer Authorized by: Cy Blamer   Consent:    Consent obtained:  Verbal   Consent given by:  Patient   Risks discussed:  Infection and need for additional repair   Alternatives discussed:  No treatment Anesthesia (see MAR for exact dosages):    Anesthesia method:  None Laceration details:    Location: above right eyebrow.   Length (cm):  1   Depth (mm):  1 Repair type:    Repair type:  Simple Pre-procedure details:    Preparation:  Patient was prepped and draped in usual sterile fashion Exploration:    Hemostasis achieved with:  Direct pressure   Wound exploration: wound explored through full range of motion     Wound extent: no areolar tissue violation noted     Contaminated: no   Treatment:    Area cleansed with:   Betadine   Amount of cleaning:  Standard   Irrigation solution:  Sterile saline   Visualized foreign bodies/material removed: no   Skin repair:    Repair method:  Tissue adhesive Approximation:    Approximation:  Close Post-procedure details:    Dressing:  Open (no dressing)   Patient tolerance of procedure:  Tolerated well, no immediate complications   (including critical care time)  Medications Ordered in ED Medications  bacitracin ointment (1 application Topical Given 07/13/17 0025)  acetaminophen (TYLENOL) tablet 1,000 mg (1,000 mg Oral Given 07/13/17 0026)      Final Clinical Impressions(s) / ED Diagnoses   Final diagnoses:  Contusion of face, initial encounter  Facial laceration, initial encounter  Minor head injury, initial encounter  Skin tear of elbow without complication, initial encounter   Strict return precautions given for  chest pain, dyspnea on exertion, new weakness or numbness changes in vision or speech,  Inability to tolerate liquids or food, changes in voice cough, altered mental status or any concerns. No signs of systemic  illness or infection. The patient is nontoxic-appearing on exam and vital signs are within normal limits.    I have reviewed the triage vital signs and the nursing notes. Pertinent labs &imaging results that were available during my care of the patient were reviewed by me and considered in my medical decision making (see chart for details).  After history, exam, and medical workup I feel the patient has been appropriately medically screened and is safe for discharge home. Pertinent diagnoses were discussed with the patient. Patient was given return precautions.       Sherill Mangen, MD 07/13/17 1610    Cy Blamer, MD 07/13/17 0157

## 2017-07-13 NOTE — ED Notes (Signed)
Tripped on carpet  Has abrasions to rt forearm lac over rt eye, abrasions to rt side of face and rt elbow  Denies loc,  Is not on any blood thinners   Ice pack applied

## 2017-07-14 ENCOUNTER — Other Ambulatory Visit: Payer: Self-pay

## 2017-07-14 DIAGNOSIS — R413 Other amnesia: Secondary | ICD-10-CM

## 2017-07-17 ENCOUNTER — Telehealth: Payer: Self-pay

## 2017-07-17 DIAGNOSIS — R413 Other amnesia: Secondary | ICD-10-CM

## 2017-07-17 NOTE — Telephone Encounter (Signed)
FYI-Pt rtrnd my call, she already had B12 drawn at [facility] End[facility] ordered from PCP, she was questioning if could afford MRI, advsd her when [facility] calls her they will have amount she will be rqrd to pay. Pt to call me back if decides not to have test. She is to call back to make follow up

## 2017-07-17 NOTE — Telephone Encounter (Signed)
LM on VM advising Pt of tests we would like her to have as well as f/u appt. I asked that she call to set up lab and f/u OV, and that [facility] will be contacting her directly to schedule imaging.

## 2017-07-17 NOTE — Telephone Encounter (Signed)
-----   Message from Adam R Jaffe, DO sent at 07/14/2017  2:17 PM EDT ----- To evaluate memory loss, I would like to order MRI of brain without contrast and B12 level.  I would like Ms. Tenpenny to make a follow up appointment with me afterwards to discuss further. 

## 2017-07-19 ENCOUNTER — Telehealth: Payer: Self-pay

## 2017-07-19 NOTE — Telephone Encounter (Signed)
-----   Message from Drema Dallas, DO sent at 07/14/2017  2:17 PM EDT ----- To evaluate memory loss, I would like to order MRI of brain without contrast and B12 level.  I would like Ms. Somera to make a follow up appointment with me afterwards to discuss further.

## 2017-07-19 NOTE — Telephone Encounter (Signed)
LM for Pt to rtrn call-have already put in MRI order, need B12 lab and f/u OV

## 2017-07-21 ENCOUNTER — Other Ambulatory Visit: Payer: BC Managed Care – PPO

## 2017-07-21 DIAGNOSIS — I10 Essential (primary) hypertension: Secondary | ICD-10-CM

## 2017-07-21 DIAGNOSIS — E785 Hyperlipidemia, unspecified: Secondary | ICD-10-CM

## 2017-07-28 ENCOUNTER — Other Ambulatory Visit (INDEPENDENT_AMBULATORY_CARE_PROVIDER_SITE_OTHER): Payer: Medicare Other

## 2017-07-28 ENCOUNTER — Other Ambulatory Visit: Payer: Self-pay

## 2017-07-28 DIAGNOSIS — I10 Essential (primary) hypertension: Secondary | ICD-10-CM | POA: Diagnosis not present

## 2017-07-28 LAB — URINALYSIS
Bilirubin Urine: NEGATIVE
Ketones, ur: NEGATIVE
LEUKOCYTES UA: NEGATIVE
NITRITE: NEGATIVE
Specific Gravity, Urine: <=1.005 — AB (ref 1.000–1.030)
Total Protein, Urine: NEGATIVE
URINE GLUCOSE: NEGATIVE
UROBILINOGEN UA: 0.2 (ref 0.0–1.0)
pH: 6.5 (ref 5.0–8.0)

## 2017-07-28 LAB — BASIC METABOLIC PANEL
BUN: 12 mg/dL (ref 6–23)
CALCIUM: 9.1 mg/dL (ref 8.4–10.5)
CHLORIDE: 101 meq/L (ref 96–112)
CO2: 25 meq/L (ref 19–32)
Creatinine, Ser: 0.77 mg/dL (ref 0.40–1.20)
GFR: 79.73 mL/min (ref >60.00)
Glucose, Bld: 92 mg/dL (ref 70–99)
Potassium: 3.5 mEq/L (ref 3.5–5.1)
SODIUM: 135 meq/L (ref 135–145)

## 2017-07-28 LAB — LIPID PANEL
CHOL/HDL RATIO: 3
Cholesterol: 165 mg/dL (ref 0–200)
HDL: 48.1 mg/dL (ref >39.00)
LDL Cholesterol: 101 mg/dL — ABNORMAL HIGH (ref 0–99)
NonHDL: 116.71
TRIGLYCERIDES: 79 mg/dL (ref 0.0–149.0)
VLDL: 15.8 mg/dL (ref 0.0–40.0)

## 2017-07-28 NOTE — Progress Notes (Signed)
Pre visit review using our clinic review tool, if applicable. No additional management support is needed unless otherwise documented below in the visit note. 

## 2017-07-28 NOTE — Progress Notes (Addendum)
Subjective:   [name] is a 66 y.o. female who presents for an Initial Medicare Annual Wellness Visit.  Review of Systems    No ROS.  Medicare Wellness Visit. Additional risk factors are reflected in the social history.  Cardiac Risk Factors include: advanced age (>22men, >23 women);dyslipidemia;hypertension Sleep patterns: feels rested on waking, does not get up to void, gets up 1 times nightly to void and sleeps 7-8 hours nightly.    Home Safety/Smoke Alarms: Feels safe in home. Smoke alarms in place.  Living environment; residence and Firearm Safety: 2-story house, no firearms.Lives alone, no needs for DME, good support system Seat Belt Safety/Bike Helmet: Wears seat belt.     Objective:    Today's Vitals   07/31/17 0804  BP: (!) 142/82  Pulse: 75  Temp: 98 F (36.7 C)  TempSrc: Oral  SpO2: 98%  Weight: 163 lb (73.9 kg)  Height:  (1.6 m)   Body mass index is 28.87 kg/m.   Current Medications (verified) Outpatient Encounter Prescriptions as of 07/31/2017  Medication Sig  . Acetaminophen (TYLENOL ARTHRITIS PAIN PO) Take by mouth as needed.  Marland Kitchen aspirin 81 MG tablet Take 81 mg by mouth daily.    . Cholecalciferol 1000 UNITS capsule Take 1,000 Units by mouth daily.  . citalopram (CELEXA) 20 MG tablet Take 1 tablet (20 mg total) by mouth daily.  . cyanocobalamin (,VITAMIN B-12,) 1000 MCG/ML injection USE AS DIRECTED EVERY 2 WEEKS  . Ranitidine HCl (ZANTAC PO) Take by mouth as needed.    . [DISCONTINUED] citalopram (CELEXA) 20 MG tablet TAKE 1 TABLET BY MOUTH DAILY.  Marland Kitchen loratadine (CLARITIN) 10 MG tablet Take 1 tablet (10 mg total) by mouth daily. (Patient taking differently: Take 10 mg by mouth daily as needed. )   No facility-administered encounter medications on file as of 07/31/2017.     Allergies (verified) Erythromycin base and Other   History: Past Medical History:  Diagnosis Date  . Depression   . Hyperlipidemia   . LBP (low back pain)   . OA  (osteoarthritis)   . Vitamin B 12 deficiency    Past Surgical History:  Procedure Laterality Date  . CHOLECYSTECTOMY  1990   Family History  Problem Relation Age of Onset  . Pneumonia Father   . Diabetes Mother   . Heart disease Mother   . Hypertension Mother   . Pancreatic cancer Mother   . Liver cancer Mother   . Diabetes Other   . Hypertension Other   . Colon cancer Maternal Grandmother 67   Social History   Occupational History  . Not on file.   Social History Main Topics  . Smoking status: Never Smoker  . Smokeless tobacco: Never Used  . Alcohol use No  . Drug use: No  . Sexual activity: Not on file    Tobacco Counseling Counseling given: Not Answered   Activities of Daily Living In your present state of health, do you have any difficulty performing the following activities: 07/31/2017  Hearing? N  Vision? N  Difficulty concentrating or making decisions? N  Walking or climbing stairs? N  Dressing or bathing? N  Doing errands, shopping? N  Preparing Food and eating ? N  Using the Toilet? N  In the past six months, have you accidently leaked urine? N  Do you have problems with loss of bowel control? N  Managing your Medications? N  Managing your Finances? N  Housekeeping or managing your Housekeeping? N  Some recent data might be hidden    Immunizations and Health Maintenance Immunization History  Administered Date(s) Administered  . Influenza Whole 08/14/2002, 09/10/2007, 08/10/2010  . Influenza, High Dose Seasonal PF 08/26/2016  . Influenza,inj,Quad PF,6+ Mos 08/04/2014  . Influenza-Unspecified 07/31/2015  . Pneumococcal Conjugate-13 08/26/2016  . Pneumococcal Polysaccharide-23 08/28/2001, 02/17/2017  . Td 11/09/1998, 02/17/2010  . Zoster 06/07/2012   Health Maintenance Due  Topic Date Due  . Hepatitis C Screening  February 10, 1951  . HIV Screening  08/23/1966  . DEXA SCAN  08/23/2016  . INFLUENZA VACCINE  05/24/2017    Patient Care  Team: Plotnikov, Georgina Quint, MD as PCP - General Maxie Better, MD (Obstetrics and Gynecology) Maxie Better, MD (Obstetrics and Gynecology) Hart Carwin, MD (Inactive) (Gastroenterology)  Indicate any recent Medical Services you may have received from other than [facility] providers in the past year (date may be approximate).     Assessment:   This is a routine wellness examination for [name]. Physical assessment deferred to PCP.   Hearing/Vision screen Hearing Screening Comments: Able to hear conversational tones w/o difficulty. No issues reported.  Passed whisper test Vision Screening Comments: appointment yearly Dr. Nile Riggs  Dietary issues and exercise activities discussed: Current Exercise Habits: The patient does not participate in regular exercise at present (chair exercise pamphlets provided), Exercise limited by: None identified  Diet (meal preparation, eat out, water intake, caffeinated beverages, dairy products, fruits and vegetables): in general, a "healthy" diet  , well balanced  Reviewed heart healthy diet, discussed weight loss tips, encouraged patient to increase daily water intake. Diet education was attached to patient's AVS. Relevant patient education assigned to patient using Emmi.   Goals    None     Depression Screen PHQ 2/9 Scores 07/31/2017 02/17/2017  PHQ - 2 Score 2 0  PHQ- 9 Score 4 -    Fall Risk Fall Risk  07/31/2017 03/30/2017 02/17/2017  Falls in the past year? Yes Yes Yes  Number falls in past yr: Injury with Fall? Yes No No  Risk for fall due to : - Other (Comment) -  Follow up - Falls evaluation completed;Education provided;Falls prevention discussed -    Cognitive Function:   Montreal Cognitive Assessment  03/30/2017  Visuospatial/ Executive (0/5) 2  Naming (0/3) 2  Attention: Read list of digits (0/2) 1  Attention: Read list of letters (0/1) 1  Attention: Serial 7 subtraction starting at 100 (0/3) 2  Language: Repeat phrase  (0/2) 1  Language : Fluency (0/1) 1  Abstraction (0/2) 2  Delayed Recall (0/5) 1  Orientation (0/6) 6  Total 19  Adjusted Score (based on education) 20      Screening Tests Health Maintenance  Topic Date Due  . Hepatitis C Screening  [birth year]  . HIV Screening  08/23/1966  . DEXA SCAN  08/23/2016  . INFLUENZA VACCINE  05/24/2017  . MAMMOGRAM  10/19/2018  . TETANUS/TDAP  02/18/2020  . COLONOSCOPY  03/21/2023  . PNA vac Low Risk Adult  Completed      Plan:    Continue doing brain stimulating activities (puzzles, reading, adult coloring books, staying active) to keep memory sharp.    Continue to eat heart healthy diet (full of fruits, vegetables, whole grains, lean protein, water--limit salt, fat, and sugar intake) and increase physical activity as tolerated.  I have personally reviewed and noted the following in the patient's chart:   . Medical and social history . Use of alcohol, tobacco or  illicit drugs  . Current medications and supplements . Functional ability and status . Nutritional status . Physical activity . Advanced directives . List of other physicians . Vitals . Screenings to include cognitive, depression, and falls . Referrals and appointments  In addition, I have reviewed and discussed with patient certain preventive protocols, quality metrics, and best practice recommendations. A written personalized care plan for preventive services as well as general preventive health recommendations were provided to patient.     Wanda Plump, RN   07/31/2017   Medical screening examination/treatment/procedure(s) were performed by non-physician practitioner and as supervising physician I was immediately available for consultation/collaboration. I agree with above. Jacinta Shoe, MD

## 2017-07-31 ENCOUNTER — Encounter: Payer: Self-pay | Admitting: Internal Medicine

## 2017-07-31 ENCOUNTER — Ambulatory Visit (INDEPENDENT_AMBULATORY_CARE_PROVIDER_SITE_OTHER): Payer: Medicare Other | Admitting: Internal Medicine

## 2017-07-31 VITALS — BP 142/82 | HR 75 | Temp 98.0°F | Ht 63.0 in | Wt 163.0 lb

## 2017-07-31 DIAGNOSIS — Z23 Encounter for immunization: Secondary | ICD-10-CM

## 2017-07-31 DIAGNOSIS — Z0000 Encounter for general adult medical examination without abnormal findings: Secondary | ICD-10-CM

## 2017-07-31 DIAGNOSIS — F32A Depression, unspecified: Secondary | ICD-10-CM

## 2017-07-31 DIAGNOSIS — F329 Major depressive disorder, single episode, unspecified: Secondary | ICD-10-CM

## 2017-07-31 DIAGNOSIS — E2839 Other primary ovarian failure: Secondary | ICD-10-CM | POA: Diagnosis not present

## 2017-07-31 DIAGNOSIS — E538 Deficiency of other specified B group vitamins: Secondary | ICD-10-CM | POA: Diagnosis not present

## 2017-07-31 DIAGNOSIS — I10 Essential (primary) hypertension: Secondary | ICD-10-CM

## 2017-07-31 DIAGNOSIS — R404 Transient alteration of awareness: Secondary | ICD-10-CM | POA: Diagnosis not present

## 2017-07-31 MED ORDER — CITALOPRAM HYDROBROMIDE 20 MG PO TABS: 20.0000 mg | ORAL_TABLET | Freq: Every day | ORAL | 1 refills | Status: DC

## 2017-07-31 NOTE — Assessment & Plan Note (Signed)
On NAS diet  

## 2017-07-31 NOTE — Assessment & Plan Note (Signed)
On B12 

## 2017-07-31 NOTE — Progress Notes (Signed)
Subjective:  Patient ID: [name], female    DOB: 06-08-1951  Age: 66 y.o. MRN: [id number]  CC: No chief complaint on file.   HPI [name] presents for B12 def, depression, confusion episode f/u  Outpatient Medications Prior to Visit  Medication Sig Dispense Refill  . Acetaminophen (TYLENOL ARTHRITIS PAIN PO) Take by mouth as needed.    Marland Kitchen aspirin 81 MG tablet Take 81 mg by mouth daily.      . Cholecalciferol 1000 UNITS capsule Take 1,000 Units by mouth daily.    . citalopram (CELEXA) 20 MG tablet TAKE 1 TABLET BY MOUTH DAILY. 90 tablet 1  . cyanocobalamin (,VITAMIN B-12,) 1000 MCG/ML injection USE AS DIRECTED EVERY 2 WEEKS 30 mL 1  . Ranitidine HCl (ZANTAC PO) Take by mouth as needed.      . loratadine (CLARITIN) 10 MG tablet Take 1 tablet (10 mg total) by mouth daily. (Patient taking differently: Take 10 mg by mouth daily as needed. ) 30 tablet 3   No facility-administered medications prior to visit.     ROS Review of Systems  Constitutional: Negative for activity change, appetite change, chills, fatigue and unexpected weight change.  HENT: Negative for congestion, mouth sores and sinus pressure.   Eyes: Negative for visual disturbance.  Respiratory: Negative for cough and chest tightness.   Gastrointestinal: Negative for abdominal pain and nausea.  Genitourinary: Negative for difficulty urinating, frequency and vaginal pain.  Musculoskeletal: Negative for back pain and gait problem.  Skin: Negative for pallor and rash.  Neurological: Negative for dizziness, tremors, weakness, numbness and headaches.  Psychiatric/Behavioral: Negative for confusion and sleep disturbance. The patient is nervous/anxious.     Objective:  BP (!) 142/82 (BP Location: Left Arm, Patient Position: Sitting, Cuff Size: Normal)   Pulse 75   Temp 98 F (36.7 C) (Oral)   Ht  (1.6 m)   Wt 163 lb (73.9 kg)   SpO2 98%   BMI 28.87 kg/m   BP Readings from Last 3 Encounters:    07/31/17 (!) 142/82  07/13/17 (!) 145/83  03/30/17 140/80    Wt Readings from Last 3 Encounters:  07/31/17 163 lb (73.9 kg)  07/12/17 180 lb (81.6 kg)  03/30/17 173 lb 5 oz (78.6 kg)    Physical Exam  Constitutional: She appears well-developed. No distress.  HENT:  Head: Normocephalic.  Right Ear: External ear normal.  Left Ear: External ear normal.  Nose: Nose normal.  Mouth/Throat: Oropharynx is clear and moist.  Eyes: Pupils are equal, round, and reactive to light. Conjunctivae are normal. Right eye exhibits no discharge. Left eye exhibits no discharge.  Neck: Normal range of motion. Neck supple. No JVD present. No tracheal deviation present. No thyromegaly present.  Cardiovascular: Normal rate, regular rhythm and normal heart sounds.   Pulmonary/Chest: No stridor. No respiratory distress. She has no wheezes.  Abdominal: Soft. Bowel sounds are normal. She exhibits no distension and no mass. There is no tenderness. There is no rebound and no guarding.  Musculoskeletal: She exhibits no edema.  Lymphadenopathy:    She has no cervical adenopathy.  Neurological: She displays normal reflexes. No cranial nerve deficit. She exhibits normal muscle tone. Coordination normal.  Skin: No rash noted. No erythema.  Psychiatric: She has a normal mood and affect. Her behavior is normal. Judgment and thought content normal.    Lab Results  Component Value Date   WBC 9.1 02/13/2017   HGB 14.9 02/13/2017   HCT 44.0  02/13/2017   PLT 300.0 02/13/2017   GLUCOSE 92 07/28/2017   CHOL 165 07/28/2017   TRIG 79.0 07/28/2017   HDL 48.10 07/28/2017   LDLDIRECT 132.3 01/28/2009   LDLCALC 101 (H) 07/28/2017   ALT 9 02/13/2017   ALT 9 02/13/2017   AST 17 02/13/2017   AST 17 02/13/2017   NA 135 07/28/2017   K 3.5 07/28/2017   CL 101 07/28/2017   CREATININE 0.77 07/28/2017   BUN 12 07/28/2017   CO2 25 07/28/2017   TSH 0.73 02/13/2017    Ct Head Wo Contrast  Result Date:  07/13/2017 CLINICAL DATA:  Patient fell yesterday striking right side of the face. Pain right superior orbital rim. No neck pain. EXAM: CT HEAD WITHOUT CONTRAST CT CERVICAL SPINE WITHOUT CONTRAST TECHNIQUE: Multidetector CT imaging of the head and cervical spine was performed following the standard protocol without intravenous contrast. Multiplanar CT image reconstructions of the cervical spine were also generated. COMPARISON:  None. FINDINGS: CT HEAD FINDINGS Brain: Mild diffuse cerebral atrophy. Scattered patchy low-attenuation changes in the deep white matter consistent with small vessel ischemia. No evidence of acute infarction, hemorrhage, hydrocephalus, extra-axial collection or mass lesion/mass effect. Vascular: No hyperdense vessel or unexpected calcification. Skull: The calvarium appears intact. Sinuses/Orbits: Paranasal sinuses and mastoid air cells are clear. 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 Normal variation. CT CERVICAL SPINE FINDINGS Alignment: Normal alignment of the cervical vertebrae and facet joints. C1-2 articulation appears intact. Skull base and vertebrae: No vertebral compression deformities. No focal bone lesion or bone destruction. Bone cortex appears intact. Congenital nonunion of the posterior arch of C1, normal variation. Soft tissues and spinal canal: No prevertebral soft tissue swelling. No paraspinal infiltration. Disc levels: Degenerative changes throughout the cervical spine with narrowed interspaces and endplate hypertrophic changes. Degenerative changes are most prominent at C5-6 and C6-7 levels. Upper chest: Lung apices are clear. Other: None. IMPRESSION: No acute intracranial abnormalities.  Mild diffuse cerebral atrophy. Normal alignment of the cervical spine. Degenerative changes. No acute displaced fractures identified. Electronically Signed   By: Burman Nieves M.D.   On: 07/13/2017 01:34   Ct Cervical Spine Wo Contrast  Result Date:  07/13/2017 CLINICAL DATA:  Patient fell yesterday striking right side of the face. Pain right superior orbital rim. No neck pain. EXAM: CT HEAD WITHOUT CONTRAST CT CERVICAL SPINE WITHOUT CONTRAST TECHNIQUE: Multidetector CT imaging of the head and cervical spine was performed following the standard protocol without intravenous contrast. Multiplanar CT image reconstructions of the cervical spine were also generated. COMPARISON:  None. FINDINGS: CT HEAD FINDINGS Brain: Mild diffuse cerebral atrophy. Scattered patchy low-attenuation changes in the deep white matter consistent with small vessel ischemia. No evidence of acute infarction, hemorrhage, hydrocephalus, extra-axial collection or mass lesion/mass effect. Vascular: No hyperdense vessel or unexpected calcification. Skull: The calvarium appears intact. Sinuses/Orbits: Paranasal sinuses and mastoid air cells are clear. 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 Normal variation. CT CERVICAL SPINE FINDINGS Alignment: Normal alignment of the cervical vertebrae and facet joints. C1-2 articulation appears intact. Skull base and vertebrae: No vertebral compression deformities. No focal bone lesion or bone destruction. Bone cortex appears intact. Congenital nonunion of the posterior arch of C1, normal variation. Soft tissues and spinal canal: No prevertebral soft tissue swelling. No paraspinal infiltration. Disc levels: Degenerative changes throughout the cervical spine with narrowed interspaces and endplate hypertrophic changes. Degenerative changes are most prominent at C5-6 and C6-7 levels. Upper chest: Lung apices are clear. Other:  None. IMPRESSION: No acute intracranial abnormalities.  Mild diffuse cerebral atrophy. Normal alignment of the cervical spine. Degenerative changes. No acute displaced fractures identified. Electronically Signed   By: Burman Nieves M.D.   On: 07/13/2017 01:34    Assessment & Plan:   There are no diagnoses linked to this  encounter. I am having Ms. Greeno maintain her aspirin, Ranitidine HCl (ZANTAC PO), Cholecalciferol, Acetaminophen (TYLENOL ARTHRITIS PAIN PO), loratadine, cyanocobalamin, and citalopram.  No orders of the defined types were placed in this encounter.    Follow-up: No Follow-up on file.  Sonda Primes, MD

## 2017-07-31 NOTE — Patient Instructions (Addendum)
Continue doing brain stimulating activities (puzzles, reading, adult coloring books, staying active) to keep memory sharp.    Continue to eat heart healthy diet (full of fruits, vegetables, whole grains, lean protein, water--limit salt, fat, and sugar intake) and increase physical activity as tolerated.   [name] , Thank you for taking time to come for your Medicare Wellness Visit. I appreciate your ongoing commitment to your health goals. Please review the following plan we discussed and let me know if I can assist you in the future.   These are the goals we discussed: Goals    None      This is a list of the screening recommended for you and due dates:  Health Maintenance  Topic Date Due  .  Hepatitis C: One time screening is recommended by Center for Disease Control  (CDC) for  adults born from [birth year] through 1965.   1951-09-08  . HIV Screening  08/23/1966  . DEXA scan (bone density measurement)  08/23/2016  . Flu Shot  05/24/2017  . Mammogram  10/19/2018  . Tetanus Vaccine  02/18/2020  . Colon Cancer Screening  03/21/2023  . Pneumonia vaccines  Completed    Protein Content in Foods Generally, most healthy people need around 50 grams of protein each day. Depending on your overall health, you may need more or less protein in your diet. Talk to your health care provider or dietitian about how much protein you need. See the following list for the protein content of some common foods. High-protein foods High-protein foods contain 4 grams (4 g) or more of protein per serving. They include:  Beef, ground sirloin (cooked) - 3 oz have 24 g of protein.  Cheese (hard) - 1 oz has 7 g of protein.  Chicken breast, boneless and skinless (cooked) - 3 oz have 13.4 g of protein.  Cottage cheese - 1/2 cup has 13.4 g of protein.  Egg - 1 egg has 6 g of protein.  Fish, filet (cooked) - 1 oz has 6-7 g of protein.  Garbanzo beans (canned or cooked) - 1/2 cup has 6-7 g of  protein.  Kidney beans (canned or cooked) - 1/2 cup has 6-7 g of protein.  Lamb (cooked) - 3 oz has 24 g of protein.  Milk - 1 cup (8 oz) has 8 g of protein.  Nuts (peanuts, pistachios, almonds) - 1 oz has 6 g of protein.  Peanut butter - 1 oz has 7-8 g of protein.  Pork tenderloin (cooked) - 3 oz has 18.4 g of protein.  Pumpkin seeds - 1 oz has 8.5 g of protein.  Soybeans (roasted) - 1 oz has 8 g of protein.  Soybeans (cooked) - 1/2 cup has 11 g of protein.  Soy milk - 1 cup (8 oz) has 5-10 g of protein.  Soy or vegetable patty - 1 patty has 11 g of protein.  Sunflower seeds - 1 oz has 5.5 g of protein.  Tofu (firm) - 1/2 cup has 20 g of protein.  Tuna (canned in water) - 3 oz has 20 g of protein.  Yogurt - 6 oz has 8 g of protein.  Low-protein foods Low-protein foods contain 3 grams (3 g) or less of protein per serving. They include:  Beets (raw or cooked) - 1/2 cup has 1.5 g of protein.  Bran cereal - 1/2 cup has 2-3 g of protein.  Bread - 1 slice has 2.5 g of protein.  Broccoli (raw or cooked) -  1/2 cup has 2 g of protein.  Collard greens (raw or cooked) - 1/2 cup has 2 g of protein.  Corn (fresh or cooked) - 1/2 cup has 2 g of protein.  Cream cheese - 1 oz has 2 g of protein.  Creamer (half-and-half) - 1 oz has 1 g of protein.  Flour tortilla - 1 tortilla has 2.5 g of protein  Frozen yogurt - 1/2 cup has 3 g of protein.  Fruit or vegetable juice - 1/2 cup has 1 g of protein.  Green beans (raw or cooked) - 1/2 cup has 1 g of protein.  Green peas (canned) - 1/2 cup has 3.5 g of protein.  Muffins - 1 small muffin (2 oz) has 3 g of protein.  Oatmeal (cooked) - 1/2 cup has 3 g of protein.  Potato (baked with skin) - 1 medium potato has 3 g of protein.  Rice (cooked) - 1/2 cup has 2.5-3.5 g of protein.  Sour cream - 1/2 cup has 2.5 g of protein.  Spinach (cooked) - 1/2 cup has 3 g of protein.  Squash (cooked) - 1/2 cup has 1.5 g of  protein.  Actual amounts of protein may be different depending on processing. Talk with your health care provider or dietitian about what foods are recommended for you. This information is not intended to replace advice given to you by your health care provider. Make sure you discuss any questions you have with your health care provider. Document Released: 01/09/2016 Document Revised: 06/20/2016 Document Reviewed: 06/20/2016 Elsevier Interactive Patient Education  [employer].

## 2017-07-31 NOTE — Assessment & Plan Note (Signed)
No relapse Brain MRI is pending F/u w/Dr Everlena Cooper

## 2017-07-31 NOTE — Assessment & Plan Note (Signed)
Citalopram 

## 2017-08-02 NOTE — Addendum Note (Signed)
Addended by: Scarlett Presto on: 08/02/2017 03:12 PM   Modules accepted: Orders

## 2017-09-19 ENCOUNTER — Telehealth: Payer: Self-pay | Admitting: Internal Medicine

## 2017-09-19 NOTE — Telephone Encounter (Signed)
Forwarding msgto schedulers to make bone density appt...Raechel Chute/lmb

## 2017-09-19 NOTE — Telephone Encounter (Signed)
Copied from CRM [id number]. Topic: Quick Communication - Office Called Patient >> Sep 19, 2017  1:29 PM Guinevere FerrariMorris, Rae Sutcliffe E, NT wrote: Reason for CRM: Pt. Called back about speaking with someone name Kathie RhodesBetty to schedule her for a bone intensity test. Pt. Would like a call back.

## 2017-09-20 NOTE — Telephone Encounter (Signed)
Appointment scheduled.

## 2017-09-25 ENCOUNTER — Ambulatory Visit (INDEPENDENT_AMBULATORY_CARE_PROVIDER_SITE_OTHER)
Admission: RE | Admit: 2017-09-25 | Discharge: 2017-09-25 | Disposition: A | Discharge status: Home / Self Care | Payer: BC Managed Care – PPO | Source: Ambulatory Visit | Attending: Internal Medicine | Admitting: Internal Medicine

## 2017-09-25 DIAGNOSIS — E2839 Other primary ovarian failure: Secondary | ICD-10-CM | POA: Diagnosis not present

## 2017-09-26 DIAGNOSIS — E2839 Other primary ovarian failure: Secondary | ICD-10-CM | POA: Diagnosis not present

## 2017-09-27 ENCOUNTER — Other Ambulatory Visit: Payer: BC Managed Care – PPO

## 2018-01-31 ENCOUNTER — Ambulatory Visit: Payer: Medicare Other | Admitting: Internal Medicine

## 2018-03-21 ENCOUNTER — Ambulatory Visit: Payer: Self-pay

## 2018-03-21 NOTE — Telephone Encounter (Signed)
Pt calling to report two tick bites (deer ticks). Pt was bitten on her left shoulder and right side. Pt thinks it was attached for a few hours. Pt removed both ticks intact. Pt denies fever, rash, or redness to the site of the bites. Home care care advice given to pt and indications of when to call back. Pt stated understanding.   Reason for Disposition . Tick bite with no complications  Answer Assessment - Initial Assessment Questions 1. TYPE of TICK: "Is it a wood tick or a deer tick?" If unsure, ask: "What size was the tick?" "Did it look more like a watermelon seed or a poppy seed?"      Poppy seed- deer tick 2. LOCATION: "Where is the tick bite located?"      Left shoulder and right side 3. ONSET: "How long do you think the tick was attached before you removed it?" (Hours or days)      hours 4. TETANUS: "When was the last tetanus booster?"      8 years ago 5. PREGNANCY: "Is there any chance you are pregnant?" "When was your last menstrual period?"     n/a  Protocols used: TICK BITE-A-AH

## 2018-04-03 ENCOUNTER — Other Ambulatory Visit: Payer: Self-pay | Admitting: Internal Medicine

## 2018-04-25 ENCOUNTER — Other Ambulatory Visit: Payer: Self-pay | Admitting: Internal Medicine

## 2018-07-09 ENCOUNTER — Other Ambulatory Visit: Payer: Self-pay | Admitting: Internal Medicine

## 2018-08-07 ENCOUNTER — Other Ambulatory Visit: Payer: Self-pay | Admitting: Internal Medicine

## 2018-08-08 ENCOUNTER — Other Ambulatory Visit: Payer: Self-pay | Admitting: Internal Medicine

## 2018-08-13 ENCOUNTER — Other Ambulatory Visit: Payer: Self-pay | Admitting: Internal Medicine

## 2018-08-14 ENCOUNTER — Telehealth: Payer: Self-pay | Admitting: Internal Medicine

## 2018-08-14 NOTE — Telephone Encounter (Signed)
Copied from CRM [id number]. Topic: Appointment Scheduling - Scheduling Inquiry for Clinic >> Aug 14, 2018 10:35 AM Stephannie Li, NT wrote: Reason for CRM: Patient called and would like an appointment for her  AWV  please call  [phone]

## 2018-08-23 NOTE — Progress Notes (Addendum)
Subjective:   [name] is a 67 y.o. female who presents for an Initial Medicare Annual Wellness Visit.  Review of Systems    No ROS.  Medicare Wellness Visit. Additional risk factors are reflected in the social history.   Cardiac Risk Factors include: advanced age (>44men, >7 women);dyslipidemia Sleep patterns: feels rested on waking, does not get up to void and sleeps 7-8 hours nightly.    Home Safety/Smoke Alarms: Feels safe in home. Smoke alarms in place.  Living environment; residence and Firearm Safety: 1-story house/ trailer, no firearms. Lives alone, no needs for DME, good support system Seat Belt Safety/Bike Helmet: Wears seat belt.    Objective:    Today's Vitals   08/24/18 0937  BP: 126/78  Pulse: 72  Resp: 18  SpO2: 100%  Weight: 162 lb (73.5 kg)  Height: 5\' 3"  (1.6 m)   Body mass index is 28.7 kg/m.  Advanced Directives 08/24/2018 07/31/2017 07/12/2017  Does Patient Have a Medical Advance Directive? No No No  Would patient like information on creating a medical advance directive? Yes (ED - Information included in AVS) Yes (ED - Information included in AVS) -    Current Medications (verified) Outpatient Encounter Medications as of 08/24/2018  Medication Sig  . aspirin 81 MG tablet Take 81 mg by mouth daily.    . Cholecalciferol 1000 UNITS capsule Take 1,000 Units by mouth daily.  . cyanocobalamin (,VITAMIN B-12,) 1000 MCG/ML injection USE AS DIRECTED EVERY 2 WEEKS  . Ranitidine HCl (ZANTAC PO) Take by mouth as needed.    . loratadine (CLARITIN) 10 MG tablet Take 1 tablet (10 mg total) by mouth daily. (Patient taking differently: Take 10 mg by mouth daily as needed. )  . [DISCONTINUED] Acetaminophen (TYLENOL ARTHRITIS PAIN PO) Take by mouth as needed.  . [DISCONTINUED] citalopram (CELEXA) 20 MG tablet Take 1 tablet (20 mg total) by mouth daily. -- Office visit needed for further refills (Patient not taking: Reported on 08/24/2018)   No  facility-administered encounter medications on file as of 08/24/2018.     Allergies (verified) Erythromycin base and Other   History: Past Medical History:  Diagnosis Date  . Depression   . Hyperlipidemia   . LBP (low back pain)   . OA (osteoarthritis)   . Vitamin B 12 deficiency    Past Surgical History:  Procedure Laterality Date  . CHOLECYSTECTOMY  1990   Family History  Problem Relation Age of Onset  . Pneumonia Father   . Diabetes Mother   . Heart disease Mother   . Hypertension Mother   . Pancreatic cancer Mother   . Liver cancer Mother   . Diabetes Other   . Hypertension Other   . Colon cancer Maternal Grandmother 65   Social History   Socioeconomic History  . Marital status: Single    Spouse name: Not on file  . Number of children: 0  . Years of education: 35  . Highest education level: Not on file  Occupational History  . Not on file  Social Needs  . Financial resource strain: Not hard at all  . Food insecurity:    Worry: Never true    Inability: Never true  . Transportation needs:    Medical: No    Non-medical: No  Tobacco Use  . Smoking status: Never Smoker  . Smokeless tobacco: Never Used  Substance and Sexual Activity  . Alcohol use: No  . Drug use: No  . Sexual activity: Not Currently  Lifestyle  . Physical activity:    Days per week: 3 days    Minutes per session: 40 min  . Stress: To some extent  Relationships  . Social connections:    Talks on phone: More than three times a week    Gets together: More than three times a week    Attends religious service: 1 to 4 times per year    Active member of club or organization: Yes    Attends meetings of clubs or organizations: More than 4 times per year    Relationship status: Not on file  Other Topics Concern  . Not on file  Social History Narrative   Lives alone in a 1 1/2 story home.  No children.  Retired from [employer].  Education: high school.      Tobacco Counseling Counseling  given: Not Answered  Activities of Daily Living In your present state of health, do you have any difficulty performing the following activities: 08/24/2018  Hearing? N  Vision? N  Difficulty concentrating or making decisions? N  Walking or climbing stairs? N  Dressing or bathing? N  Doing errands, shopping? N  Preparing Food and eating ? N  Using the Toilet? N  In the past six months, have you accidently leaked urine? N  Do you have problems with loss of bowel control? N  Managing your Medications? N  Managing your Finances? N  Housekeeping or managing your Housekeeping? N  Some recent data might be hidden     Immunizations and Health Maintenance Immunization History  Administered Date(s) Administered  . Influenza Whole 08/14/2002, 09/10/2007, 08/10/2010  . Influenza, High Dose Seasonal PF 08/26/2016, 07/31/2017, 08/24/2018  . Influenza,inj,Quad PF,6+ Mos 08/04/2014  . Influenza-Unspecified 07/31/2015  . Pneumococcal Conjugate-13 08/26/2016  . Pneumococcal Polysaccharide-23 08/28/2001, 02/17/2017  . Td 11/09/1998, 02/17/2010  . Zoster 06/07/2012   Health Maintenance Due  Topic Date Due  . Hepatitis C Screening  [birth year]  . INFLUENZA VACCINE  05/24/2018    Patient Care Team: Plotnikov, Georgina Quint, MD as PCP - General Maxie Better, MD (Obstetrics and Gynecology) Maxie Better, MD (Obstetrics and Gynecology) Hart Carwin, MD (Inactive) (Gastroenterology)  Indicate any recent Medical Services you may have received from other than [facility] providers in the past year (date may be approximate).     Assessment:   This is a routine wellness examination for [name]. Physical assessment deferred to PCP.   Hearing/Vision screen Hearing Screening Comments: Able to hear conversational tones w/o difficulty. No issues reported.  Passed whisper test Vision Screening Comments: appointment yearly  Dr. Nile Riggs  Dietary issues and exercise activities discussed: Current  Exercise Habits: Home exercise routine, Time (Minutes): 30, Frequency (Times/Week): 4, Weekly Exercise (Minutes/Week): 120, Intensity: Mild, Exercise limited by: orthopedic condition(s)  Diet (meal preparation, eat out, water intake, caffeinated beverages, dairy products, fruits and vegetables): in general, a "healthy" diet  , well balanced   Reviewed heart healthy diet. Encouraged patient to increase daily water and healthy fluid intake.  Goals    . Patient Stated     I am going to try to get on the bicycle more often and eat healthier.      Depression Screen PHQ 2/9 Scores 08/24/2018 07/31/2017 02/17/2017  PHQ - 2 Score 0 2 0  PHQ- 9 Score 0 4 -    Fall Risk Fall Risk  08/24/2018 07/31/2017 03/30/2017 02/17/2017  Falls in the past year? 0 Yes Yes Yes  Number falls in past yr: - 1  1 1  Injury with Fall? - Yes No No  Risk for fall due to : - - Other (Comment) -  Follow up - - Falls evaluation completed;Education provided;Falls prevention discussed -   Cognitive Function: MMSE - Mini Mental State Exam 08/24/2018  Orientation to time 5  Orientation to Place 5  Registration 3  Attention/ Calculation 5  Recall 0  Language- name 2 objects 2  Language- repeat 1  Language- follow 3 step command 3  Language- read & follow direction 1  Write a sentence 1  Copy design 1  Total score 27   Montreal Cognitive Assessment  03/30/2017  Visuospatial/ Executive (0/5) 2  Naming (0/3) 2  Attention: Read list of digits (0/2) 1  Attention: Read list of letters (0/1) 1  Attention: Serial 7 subtraction starting at 100 (0/3) 2  Language: Repeat phrase (0/2) 1  Language : Fluency (0/1) 1  Abstraction (0/2) 2  Delayed Recall (0/5) 1  Orientation (0/6) 6  Total 19  Adjusted Score (based on education) 20      Screening Tests Health Maintenance  Topic Date Due  . Hepatitis C Screening  [birth year]  . INFLUENZA VACCINE  05/24/2018  . MAMMOGRAM  10/19/2018  . TETANUS/TDAP  02/18/2020  .  COLONOSCOPY  03/21/2023  . DEXA SCAN  Completed  . PNA vac Low Risk Adult  Completed     Plan:     Continue doing brain stimulating activities (puzzles, reading, adult coloring books, staying active) to keep memory sharp.   Continue to eat heart healthy diet (full of fruits, vegetables, whole grains, lean protein, water--limit salt, fat, and sugar intake) and increase physical activity as tolerated.  I have personally reviewed and noted the following in the patient's chart:   . Medical and social history . Use of alcohol, tobacco or illicit drugs  . Current medications and supplements . Functional ability and status . Nutritional status . Physical activity . Advanced directives . List of other physicians . Vitals . Screenings to include cognitive, depression, and falls . Referrals and appointments  In addition, I have reviewed and discussed with patient certain preventive protocols, quality metrics, and best practice recommendations. A written personalized care plan for preventive services as well as general preventive health recommendations were provided to patient.     Wanda Plump, RN   08/24/2018   Medical screening examination/treatment/procedure(s) were performed by the [employer], RN. As primary care provider I was immediately available for consulation/collaboration. I agree with above documentation. Alphonse Guild, NP

## 2018-08-24 ENCOUNTER — Ambulatory Visit (INDEPENDENT_AMBULATORY_CARE_PROVIDER_SITE_OTHER): Payer: Medicare Other | Admitting: *Deleted

## 2018-08-24 VITALS — BP 126/78 | HR 72 | Resp 18 | Ht 63.0 in | Wt 162.0 lb

## 2018-08-24 DIAGNOSIS — Z0000 Encounter for general adult medical examination without abnormal findings: Secondary | ICD-10-CM

## 2018-08-24 DIAGNOSIS — Z23 Encounter for immunization: Secondary | ICD-10-CM | POA: Diagnosis not present

## 2018-08-24 NOTE — Patient Instructions (Addendum)
Continue doing brain stimulating activities (puzzles, reading, adult coloring books, staying active) to keep memory sharp.   Continue to eat heart healthy diet (full of fruits, vegetables, whole grains, lean protein, water--limit salt, fat, and sugar intake) and increase physical activity as tolerated.  Web-site for free memory games: [email or web address].[email or web address]    [name] , Thank you for taking time to come for your Medicare Wellness Visit. I appreciate your ongoing commitment to your health goals. Please review the following plan we discussed and let me know if I can assist you in the future.   These are the goals we discussed: Goals    . Patient Stated     I am going to try to get on the bicycle more often and eat healthier.       This is a list of the screening recommended for you and due dates:  Health Maintenance  Topic Date Due  .  Hepatitis C: One time screening is recommended by Center for Disease Control  (CDC) for  adults born from [birth year] through 1965.   05-13-1951  . Flu Shot  05/24/2018  . Mammogram  10/19/2018  . Tetanus Vaccine  02/18/2020  . Colon Cancer Screening  03/21/2023  . DEXA scan (bone density measurement)  Completed  . Pneumonia vaccines  Completed   Influenza Virus Vaccine injection What is this medicine? INFLUENZA VIRUS VACCINE (in floo EN zuh VAHY ruhs vak SEEN) helps to reduce the risk of getting influenza also known as the flu. The vaccine only helps protect you against some strains of the flu. This medicine may be used for other purposes; ask your health care provider or pharmacist if you have questions. COMMON BRAND NAME(S): Afluria, Agriflu, Alfuria, FLUAD, Fluarix, Fluarix Quadrivalent, Flublok, Flublok Quadrivalent, FLUCELVAX, Flulaval, Fluvirin, Fluzone, Fluzone High-Dose, Fluzone Intradermal What should I tell my health care provider before I take this medicine? They need to know if you have any of these conditions: -bleeding  disorder like hemophilia -fever or infection -Guillain-Barre syndrome or other neurological problems -immune system problems -infection with the human immunodeficiency virus (HIV) or AIDS -low blood platelet counts -multiple sclerosis -an unusual or allergic reaction to influenza virus vaccine, latex, other medicines, foods, dyes, or preservatives. Different brands of vaccines contain different allergens. Some may contain latex or eggs. Talk to your doctor about your allergies to make sure that you get the right vaccine. -pregnant or trying to get pregnant -breast-feeding How should I use this medicine? This vaccine is for injection into a muscle or under the skin. It is given by a health care professional. A copy of Vaccine Information Statements will be given before each vaccination. Read this sheet carefully each time. The sheet may change frequently. Talk to your healthcare provider to see which vaccines are right for you. Some vaccines should not be used in all age groups. Overdosage: If you think you have taken too much of this medicine contact a poison control center or emergency room at once. NOTE: This medicine is only for you. Do not share this medicine with others. What if I miss a dose? This does not apply. What may interact with this medicine? -chemotherapy or radiation therapy -medicines that lower your immune system like etanercept, anakinra, infliximab, and adalimumab -medicines that treat or prevent blood clots like warfarin -phenytoin -steroid medicines like prednisone or cortisone -theophylline -vaccines This list may not describe all possible interactions. Give your health care provider a list of all the medicines, herbs, non-prescription drugs, or dietary  supplements you use. Also tell them if you smoke, drink alcohol, or use illegal drugs. Some items may interact with your medicine. What should I watch for while using this medicine? Report any side effects that do  not go away within 3 days to your doctor or health care professional. Call your health care provider if any unusual symptoms occur within 6 weeks of receiving this vaccine. You may still catch the flu, but the illness is not usually as bad. You cannot get the flu from the vaccine. The vaccine will not protect against colds or other illnesses that may cause fever. The vaccine is needed every year. What side effects may I notice from receiving this medicine? Side effects that you should report to your doctor or health care professional as soon as possible: -allergic reactions like skin rash, itching or hives, swelling of the face, lips, or tongue Side effects that usually do not require medical attention (report to your doctor or health care professional if they continue or are bothersome): -fever -headache -muscle aches and pains -pain, tenderness, redness, or swelling at the injection site -tiredness This list may not describe all possible side effects. Call your doctor for medical advice about side effects. You may report side effects to FDA at 1-800-FDA-1088. Where should I keep my medicine? The vaccine will be given by a health care professional in a clinic, pharmacy, doctor's office, or other health care setting. You will not be given vaccine doses to store at home. NOTE: This sheet is a summary. It may not cover all possible information. If you have questions about this medicine, talk to your doctor, pharmacist, or health care provider.  2018 Elsevier/Gold Standard (2015-05-01 10:07:28)   Health Maintenance, Female Adopting a healthy lifestyle and getting preventive care can go a long way to promote health and wellness. Talk with your health care provider about what schedule of regular examinations is right for you. This is a good chance for you to check in with your provider about disease prevention and staying healthy. In between checkups, there are plenty of things you can do on your  own. Experts have done a lot of research about which lifestyle changes and preventive measures are most likely to keep you healthy. Ask your health care provider for more information. Weight and diet Eat a healthy diet  Be sure to include plenty of vegetables, fruits, low-fat dairy products, and lean protein.  Do not eat a lot of foods high in solid fats, added sugars, or salt.  Get regular exercise. This is one of the most important things you can do for your health. ? Most adults should exercise for at least 150 minutes each week. The exercise should increase your heart rate and make you sweat (moderate-intensity exercise). ? Most adults should also do strengthening exercises at least twice a week. This is in addition to the moderate-intensity exercise.  Maintain a healthy weight  Body mass index (BMI) is a measurement that can be used to identify possible weight problems. It estimates body fat based on height and weight. Your health care provider can help determine your BMI and help you achieve or maintain a healthy weight.  For females 20 years of age and older: ? A BMI below 18.5 is considered underweight. ? A BMI of 18.5 to 24.9 is normal. ? A BMI of 25 to 29.9 is considered overweight. ? A BMI of 30 and above is considered obese.  Watch levels of cholesterol and blood lipids  You  should start having your blood tested for lipids and cholesterol at 67 years of age, then have this test every 5 years.  You may need to have your cholesterol levels checked more often if: ? Your lipid or cholesterol levels are high. ? You are older than 67 years of age. ? You are at high risk for heart disease.  Cancer screening Lung Cancer  Lung cancer screening is recommended for adults 35-39 years old who are at high risk for lung cancer because of a history of smoking.  A yearly low-dose CT scan of the lungs is recommended for people who: ? Currently smoke. ? Have quit within the past 15  years. ? Have at least a 30-pack-year history of smoking. A pack year is smoking an average of one pack of cigarettes a day for 1 year.  Yearly screening should continue until it has been 15 years since you quit.  Yearly screening should stop if you develop a health problem that would prevent you from having lung cancer treatment.  Breast Cancer  Practice breast self-awareness. This means understanding how your breasts normally appear and feel.  It also means doing regular breast self-exams. Let your health care provider know about any changes, no matter how small.  If you are in your 20s or 30s, you should have a clinical breast exam (CBE) by a health care provider every 1-3 years as part of a regular health exam.  If you are 40 or older, have a CBE every year. Also consider having a breast X-ray (mammogram) every year.  If you have a family history of breast cancer, talk to your health care provider about genetic screening.  If you are at high risk for breast cancer, talk to your health care provider about having an MRI and a mammogram every year.  Breast cancer gene (BRCA) assessment is recommended for women who have family members with BRCA-related cancers. BRCA-related cancers include: ? Breast. ? Ovarian. ? Tubal. ? Peritoneal cancers.  Results of the assessment will determine the need for genetic counseling and BRCA1 and BRCA2 testing.  Cervical Cancer Your health care provider may recommend that you be screened regularly for cancer of the pelvic organs (ovaries, uterus, and vagina). This screening involves a pelvic examination, including checking for microscopic changes to the surface of your cervix (Pap test). You may be encouraged to have this screening done every 3 years, beginning at age 21.  For women ages 84-65, health care providers may recommend pelvic exams and Pap testing every 3 years, or they may recommend the Pap and pelvic exam, combined with testing for human  papilloma virus (HPV), every 5 years. Some types of HPV increase your risk of cervical cancer. Testing for HPV may also be done on women of any age with unclear Pap test results.  Other health care providers may not recommend any screening for nonpregnant women who are considered low risk for pelvic cancer and who do not have symptoms. Ask your health care provider if a screening pelvic exam is right for you.  If you have had past treatment for cervical cancer or a condition that could lead to cancer, you need Pap tests and screening for cancer for at least 20 years after your treatment. If Pap tests have been discontinued, your risk factors (such as having a new sexual partner) need to be reassessed to determine if screening should resume. Some women have medical problems that increase the chance of getting cervical cancer. In these cases,  your health care provider may recommend more frequent screening and Pap tests.  Colorectal Cancer  This type of cancer can be detected and often prevented.  Routine colorectal cancer screening usually begins at 67 years of age and continues through 67 years of age.  Your health care provider may recommend screening at an earlier age if you have risk factors for colon cancer.  Your health care provider may also recommend using home test kits to check for hidden blood in the stool.  A small camera at the end of a tube can be used to examine your colon directly (sigmoidoscopy or colonoscopy). This is done to check for the earliest forms of colorectal cancer.  Routine screening usually begins at age 67.  Direct examination of the colon should be repeated every 5-10 years through 67 years of age. However, you may need to be screened more often if early forms of precancerous polyps or small growths are found.  Skin Cancer  Check your skin from head to toe regularly.  Tell your health care provider about any new moles or changes in moles, especially if there is  a change in a mole's shape or color.  Also tell your health care provider if you have a mole that is larger than the size of a pencil eraser.  Always use sunscreen. Apply sunscreen liberally and repeatedly throughout the day.  Protect yourself by wearing long sleeves, pants, a wide-brimmed hat, and sunglasses whenever you are outside.  Heart disease, diabetes, and high blood pressure  High blood pressure causes heart disease and increases the risk of stroke. High blood pressure is more likely to develop in: ? People who have blood pressure in the high end of the normal range (130-139/85-89 mm Hg). ? People who are overweight or obese. ? People who are African American.  If you are 30-33 years of age, have your blood pressure checked every 3-5 years. If you are 25 years of age or older, have your blood pressure checked every year. You should have your blood pressure measured twice-once when you are at a hospital or clinic, and once when you are not at a hospital or clinic. Record the average of the two measurements. To check your blood pressure when you are not at a hospital or clinic, you can use: ? An automated blood pressure machine at a pharmacy. ? A home blood pressure monitor.  If you are between 1 years and 53 years old, ask your health care provider if you should take aspirin to prevent strokes.  Have regular diabetes screenings. This involves taking a blood sample to check your fasting blood sugar level. ? If you are at a normal weight and have a low risk for diabetes, have this test once every three years after 67 years of age. ? If you are overweight and have a high risk for diabetes, consider being tested at a younger age or more often. Preventing infection Hepatitis B  If you have a higher risk for hepatitis B, you should be screened for this virus. You are considered at high risk for hepatitis B if: ? You were born in a country where hepatitis B is common. Ask your health  care provider which countries are considered high risk. ? Your parents were born in a high-risk country, and you have not been immunized against hepatitis B (hepatitis B vaccine). ? You have HIV or AIDS. ? You use needles to inject street drugs. ? You live with someone who has hepatitis  B. ? You have had sex with someone who has hepatitis B. ? You get hemodialysis treatment. ? You take certain medicines for conditions, including cancer, organ transplantation, and autoimmune conditions.  Hepatitis C  Blood testing is recommended for: ? Everyone born from [birth year] through 1965. ? Anyone with known risk factors for hepatitis C.  Sexually transmitted infections (STIs)  You should be screened for sexually transmitted infections (STIs) including gonorrhea and chlamydia if: ? You are sexually active and are younger than 67 years of age. ? You are older than 67 years of age and your health care provider tells you that you are at risk for this type of infection. ? Your sexual activity has changed since you were last screened and you are at an increased risk for chlamydia or gonorrhea. Ask your health care provider if you are at risk.  If you do not have HIV, but are at risk, it may be recommended that you take a prescription medicine daily to prevent HIV infection. This is called pre-exposure prophylaxis (PrEP). You are considered at risk if: ? You are sexually active and do not regularly use condoms or know the HIV status of your partner(s). ? You take drugs by injection. ? You are sexually active with a partner who has HIV.  Talk with your health care provider about whether you are at high risk of being infected with HIV. If you choose to begin PrEP, you should first be tested for HIV. You should then be tested every 3 months for as long as you are taking PrEP. Pregnancy  If you are premenopausal and you may become pregnant, ask your health care provider about preconception counseling.  If you  may become pregnant, take 400 to 800 micrograms (mcg) of folic acid every day.  If you want to prevent pregnancy, talk to your health care provider about birth control (contraception). Osteoporosis and menopause  Osteoporosis is a disease in which the bones lose minerals and strength with aging. This can result in serious bone fractures. Your risk for osteoporosis can be identified using a bone density scan.  If you are 25 years of age or older, or if you are at risk for osteoporosis and fractures, ask your health care provider if you should be screened.  Ask your health care provider whether you should take a calcium or vitamin D supplement to lower your risk for osteoporosis.  Menopause may have certain physical symptoms and risks.  Hormone replacement therapy may reduce some of these symptoms and risks. Talk to your health care provider about whether hormone replacement therapy is right for you. Follow these instructions at home:  Schedule regular health, dental, and eye exams.  Stay current with your immunizations.  Do not use any tobacco products including cigarettes, chewing tobacco, or electronic cigarettes.  If you are pregnant, do not drink alcohol.  If you are breastfeeding, limit how much and how often you drink alcohol.  Limit alcohol intake to no more than 1 drink per day for nonpregnant women. One drink equals 12 ounces of beer, 5 ounces of wine, or 1 ounces of hard liquor.  Do not use street drugs.  Do not share needles.  Ask your health care provider for help if you need support or information about quitting drugs.  Tell your health care provider if you often feel depressed.  Tell your health care provider if you have ever been abused or do not feel safe at home. This information is not intended to  replace advice given to you by your health care provider. Make sure you discuss any questions you have with your health care provider. Document Released: 04/25/2011  Document Revised: 03/17/2016 Document Reviewed: 07/14/2015 Elsevier Interactive Patient Education  [employer].

## 2019-04-29 ENCOUNTER — Other Ambulatory Visit: Payer: Self-pay | Admitting: Internal Medicine

## 2019-06-03 ENCOUNTER — Telehealth: Payer: Self-pay | Admitting: Internal Medicine

## 2019-06-03 ENCOUNTER — Telehealth: Payer: Self-pay

## 2019-06-03 NOTE — Telephone Encounter (Signed)
Void  

## 2019-06-03 NOTE — Telephone Encounter (Signed)
Copied from [place] [phone]. Topic: General - Other >> Jun 03, 2019 11:19 AM Celene Kras A wrote: Reason for CRM: Pts niece requesting to know if she can come into the office for pts appt on 06/12/2019 due to pt having some memory issues. Pts niece states that the pt does not realize that she is having memory issues. Pts niece also states she has a lost look most of the time, she is forgetting debt card info, forgetting to eat, not taking B12 medication, and she seems very depressed Please advise.

## 2019-06-03 NOTE — Telephone Encounter (Signed)
Informed niece that as long as patient is okay with her coming back that is fine for her to come with and that she would have to be screened before visit as well as the patient.

## 2019-06-12 ENCOUNTER — Encounter: Payer: Self-pay | Admitting: Internal Medicine

## 2019-06-12 ENCOUNTER — Other Ambulatory Visit (INDEPENDENT_AMBULATORY_CARE_PROVIDER_SITE_OTHER): Payer: Medicare Other

## 2019-06-12 ENCOUNTER — Other Ambulatory Visit: Payer: Self-pay

## 2019-06-12 ENCOUNTER — Ambulatory Visit (INDEPENDENT_AMBULATORY_CARE_PROVIDER_SITE_OTHER): Payer: Medicare Other | Admitting: Internal Medicine

## 2019-06-12 VITALS — BP 136/88 | HR 77 | Temp 98.0°F | Ht 63.0 in | Wt 158.0 lb

## 2019-06-12 DIAGNOSIS — E538 Deficiency of other specified B group vitamins: Secondary | ICD-10-CM

## 2019-06-12 DIAGNOSIS — Z0000 Encounter for general adult medical examination without abnormal findings: Secondary | ICD-10-CM | POA: Diagnosis not present

## 2019-06-12 DIAGNOSIS — R413 Other amnesia: Secondary | ICD-10-CM | POA: Insufficient documentation

## 2019-06-12 DIAGNOSIS — Z23 Encounter for immunization: Secondary | ICD-10-CM

## 2019-06-12 DIAGNOSIS — I10 Essential (primary) hypertension: Secondary | ICD-10-CM | POA: Diagnosis not present

## 2019-06-12 LAB — HEPATIC FUNCTION PANEL
ALT: 13 U/L (ref 0–35)
AST: 19 U/L (ref 0–37)
Albumin: 4.4 g/dL (ref 3.5–5.2)
Alkaline Phosphatase: 68 U/L (ref 39–117)
Bilirubin, Direct: 0.1 mg/dL (ref 0.0–0.3)
Total Bilirubin: 0.7 mg/dL (ref 0.2–1.2)
Total Protein: 8 g/dL (ref 6.0–8.3)

## 2019-06-12 LAB — CBC WITH DIFFERENTIAL/PLATELET
Basophils Absolute: 0.1 10*3/uL (ref 0.0–0.1)
Basophils Relative: 0.9 % (ref 0.0–3.0)
Eosinophils Absolute: 0.1 10*3/uL (ref 0.0–0.7)
Eosinophils Relative: 1 % (ref 0.0–5.0)
HCT: 41.8 % (ref 36.0–46.0)
Hemoglobin: 14 g/dL (ref 12.0–15.0)
Lymphocytes Relative: 23.9 % (ref 12.0–46.0)
Lymphs Abs: 1.5 10*3/uL (ref 0.7–4.0)
MCHC: 33.5 g/dL (ref 30.0–36.0)
MCV: 93 fl (ref 78.0–100.0)
Monocytes Absolute: 0.5 10*3/uL (ref 0.1–1.0)
Monocytes Relative: 8.1 % (ref 3.0–12.0)
Neutro Abs: 4 10*3/uL (ref 1.4–7.7)
Neutrophils Relative %: 66.1 % (ref 43.0–77.0)
Platelets: 271 10*3/uL (ref 150.0–400.0)
RBC: 4.49 Mil/uL (ref 3.87–5.11)
RDW: 13 % (ref 11.5–15.5)
WBC: 6.1 10*3/uL (ref 4.0–10.5)

## 2019-06-12 LAB — BASIC METABOLIC PANEL
BUN: 10 mg/dL (ref 6–23)
CO2: 27 mEq/L (ref 19–32)
Calcium: 9.7 mg/dL (ref 8.4–10.5)
Chloride: 103 mEq/L (ref 96–112)
Creatinine, Ser: 0.81 mg/dL (ref 0.40–1.20)
GFR: 70.36 mL/min (ref >60.00)
Glucose, Bld: 109 mg/dL — ABNORMAL HIGH (ref 70–99)
Potassium: 4.1 mEq/L (ref 3.5–5.1)
Sodium: 138 mEq/L (ref 135–145)

## 2019-06-12 LAB — LIPID PANEL
Cholesterol: 210 mg/dL — ABNORMAL HIGH (ref 0–200)
HDL: 60.2 mg/dL (ref >39.00)
LDL Cholesterol: 135 mg/dL — ABNORMAL HIGH (ref 0–99)
NonHDL: 150.28
Total CHOL/HDL Ratio: 3
Triglycerides: 78 mg/dL (ref 0.0–149.0)
VLDL: 15.6 mg/dL (ref 0.0–40.0)

## 2019-06-12 LAB — URINALYSIS, ROUTINE W REFLEX MICROSCOPIC
Bilirubin Urine: NEGATIVE
Ketones, ur: NEGATIVE
Nitrite: NEGATIVE
Specific Gravity, Urine: 1.025 (ref 1.000–1.030)
Total Protein, Urine: NEGATIVE
Urine Glucose: NEGATIVE
Urobilinogen, UA: 0.2 (ref 0.0–1.0)
pH: 5.5 (ref 5.0–8.0)

## 2019-06-12 LAB — TSH: TSH: 0.77 u[IU]/mL (ref 0.35–4.50)

## 2019-06-12 LAB — VITAMIN B12: Vitamin B-12: 550 pg/mL (ref 211–911)

## 2019-06-12 MED ORDER — CYANOCOBALAMIN 1000 MCG/ML IJ SOLN: INTRAMUSCULAR | 3 refills | Status: DC

## 2019-06-12 MED ORDER — SHINGRIX 50 MCG/0.5ML IM SUSR: 0.5000 mL | Freq: Once | INTRAMUSCULAR | 1 refills | Status: AC

## 2019-06-12 NOTE — Assessment & Plan Note (Signed)
Mild, diet controlled

## 2019-06-12 NOTE — Patient Instructions (Signed)
Cardiac CT calcium scoring test $150   Computed tomography, more commonly known as a CT or CAT scan, is a diagnostic medical imaging test. Like traditional x-rays, it produces multiple images or pictures of the inside of the body. The cross-sectional images generated during a CT scan can be reformatted in multiple planes. They can even generate three-dimensional images. These images can be viewed on a computer monitor, printed on film or by a 3D printer, or transferred to a CD or DVD. CT images of internal organs, bones, soft tissue and blood vessels provide greater detail than traditional x-rays, particularly of soft tissues and blood vessels. A cardiac CT scan for coronary calcium is a non-invasive way of obtaining information about the presence, location and extent of calcified plaque in the coronary arteries-the vessels that supply oxygen-containing blood to the heart muscle. Calcified plaque results when there is a build-up of fat and other substances under the inner layer of the artery. This material can calcify which signals the presence of atherosclerosis, a disease of the vessel wall, also called coronary artery disease (CAD). People with this disease have an increased risk for heart attacks. In addition, over time, progression of plaque build up (CAD) can narrow the arteries or even close off blood flow to the heart. The result may be chest pain, sometimes called "angina," or a heart attack. Because calcium is a marker of CAD, the amount of calcium detected on a cardiac CT scan is a helpful prognostic tool. The findings on cardiac CT are expressed as a calcium score. Another name for this test is coronary artery calcium scoring.  What are some common uses of the procedure? The goal of cardiac CT scan for calcium scoring is to determine if CAD is present and to what extent, even if there are no symptoms. It is a screening study that may be recommended by a physician for patients with risk factors  for CAD but no clinical symptoms. The major risk factors for CAD are: . high blood cholesterol levels  . family history of heart attacks  . diabetes  . high blood pressure  . cigarette smoking  . overweight or obese  . physical inactivity   A negative cardiac CT scan for calcium scoring shows no calcification within the coronary arteries. This suggests that CAD is absent or so minimal it cannot be seen by this technique. The chance of having a heart attack over the next two to five years is very low under these circumstances. A positive test means that CAD is present, regardless of whether or not the patient is experiencing any symptoms. The amount of calcification-expressed as the calcium score-may help to predict the likelihood of a myocardial infarction (heart attack) in the coming years and helps your medical doctor or cardiologist decide whether the patient may need to take preventive medicine or undertake other measures such as diet and exercise to lower the risk for heart attack. The extent of CAD is graded according to your calcium score:  Calcium Score  Presence of CAD (coronary artery disease)  0 No evidence of CAD   1-10 Minimal evidence of CAD  11-100 Mild evidence of CAD  101-400 Moderate evidence of CAD  Over 400 Extensive evidence of CAD     These suggestions will probably help you to improve your metabolism if you are not overweight and to lose weight if you are overweight: 1.  Reduce your consumption of sugars and starches.  Eliminate high fructose corn syrup from your   diet.  Reduce your consumption of processed foods.  For desserts try to have seasonal fruits, berries, nuts, cheeses or dark chocolate with more than 70% cacao. 2.  Do not snack 3.  You do not have to eat breakfast.  If you choose to have breakfast-eat plain greek yogurt, eggs, oatmeal (without sugar) 4.  Drink water, freshly brewed unsweetened tea (green, black or herbal) or coffee.  Do not drink sodas  including diet sodas , juices, beverages sweetened with artificial sweeteners. 5.  Reduce your consumption of refined grains. 6.  Avoid protein drinks such as Optifast, Slim fast etc. Eat chicken, fish, meat, dairy and beans for your sources of protein 7.  Natural unprocessed fats like cold pressed virgin olive oil, butter, coconut oil are good for you.  Eat avocados 8.  Increase your consumption of fiber.  Fruits, berries, vegetables, whole grains, flaxseeds, Chia seeds, beans, popcorn, nuts, oatmeal are good sources of fiber 9.  Use vinegar in your diet, i.e. apple cider vinegar, red wine or balsamic vinegar 10.  You can try fasting.  For example you can skip breakfast and lunch every other day (24-hour fast) 11.  Stress reduction, good night sleep, relaxation, meditation, yoga and other physical activity is likely to help you to maintain low weight too. 12.  If you drink alcohol, limit your alcohol intake to no more than 2 drinks a day.   Mediterranean diet is good for you. ([employer] has a typical Mediterranean cuisine menu) The Mediterranean diet is a way of eating based on the traditional cuisine of countries bordering the [place] Sea. While there is no single definition of the Mediterranean diet, it is typically high in vegetables, fruits, whole grains, beans, nut and seeds, and olive oil. The main components of Mediterranean diet include: . Daily consumption of vegetables, fruits, whole grains and healthy fats  . Weekly intake of fish, poultry, beans and eggs  . Moderate portions of dairy products  . Limited intake of red meat Other important elements of the Mediterranean diet are sharing meals with family and friends, enjoying a glass of red wine and being physically active. Health benefits of a Mediterranean diet: A traditional Mediterranean diet consisting of large quantities of fresh fruits and vegetables, nuts, fish and olive oil-coupled with physical activity-can reduce  your risk of serious mental and physical health problems by: Preventing heart disease and strokes. Following a Mediterranean diet limits your intake of refined breads, processed foods, and red meat, and encourages drinking red wine instead of hard liquor-all factors that can help prevent heart disease and stroke. Keeping you agile. If you're an older adult, the nutrients gained with a Mediterranean diet may reduce your risk of developing muscle weakness and other signs of frailty by about 70 percent. Reducing the risk of Alzheimer's. Research suggests that the Mediterranean diet may improve cholesterol, blood sugar levels, and overall blood vessel health, which in turn may reduce your risk of Alzheimer's disease or dementia. Halving the risk of Parkinson's disease. The high levels of antioxidants in the Mediterranean diet can prevent cells from undergoing a damaging process called oxidative stress, thereby cutting the risk of Parkinson's disease in half. Increasing longevity. By reducing your risk of developing heart disease or cancer with the Mediterranean diet, you're reducing your risk of death at any age by 20%. Protecting against type 2 diabetes. A Mediterranean diet is rich in fiber which digests slowly, prevents huge swings in blood sugar, and can help you maintain a healthy   weight.     

## 2019-06-12 NOTE — Assessment & Plan Note (Signed)
On B12 

## 2019-06-12 NOTE — Progress Notes (Signed)
Subjective:  Patient ID: [name], female    DOB: 10-13-1951  Age: 68 y.o. MRN: [id number]  CC: No chief complaint on file.   HPI [name] presents for a well exam Niece c/o short term memory loss and fraility x months  Outpatient Medications Prior to Visit  Medication Sig Dispense Refill   aspirin 81 MG tablet Take 81 mg by mouth daily.       Cholecalciferol 1000 UNITS capsule Take 1,000 Units by mouth daily.     cyanocobalamin (,VITAMIN B-12,) 1000 MCG/ML injection USE AS DIRECTED EVERY 2 WEEKS 30 mL 1   Ranitidine HCl (ZANTAC PO) Take by mouth as needed.       loratadine (CLARITIN) 10 MG tablet Take 1 tablet (10 mg total) by mouth daily. (Patient taking differently: Take 10 mg by mouth daily as needed. ) 30 tablet 3   No facility-administered medications prior to visit.     ROS: Review of Systems  Constitutional: Negative for activity change, appetite change, chills, fatigue and unexpected weight change.  HENT: Negative for congestion, mouth sores and sinus pressure.   Eyes: Negative for visual disturbance.  Respiratory: Negative for cough and chest tightness.   Gastrointestinal: Negative for abdominal pain and nausea.  Genitourinary: Negative for difficulty urinating, frequency and vaginal pain.  Musculoskeletal: Negative for back pain and gait problem.  Skin: Negative for pallor and rash.  Neurological: Negative for dizziness, tremors, weakness, numbness and headaches.  Psychiatric/Behavioral: Negative for confusion and sleep disturbance.    Objective:  BP 136/88 (BP Location: Left Arm, Patient Position: Sitting, Cuff Size: Normal)    Pulse 77    Temp 98 F (36.7 C) (Oral)    Ht 5\' 3"  (1.6 m)    Wt 158 lb (71.7 kg)    SpO2 98%    BMI 27.99 kg/m   BP Readings from Last 3 Encounters:  06/12/19 136/88  08/24/18 126/78  07/31/17 (!) 142/82    Wt Readings from Last 3 Encounters:  06/12/19 158 lb (71.7 kg)  08/24/18 162 lb (73.5 kg)  07/31/17  163 lb (73.9 kg)    Physical Exam Constitutional:      General: She is not in acute distress.    Appearance: She is well-developed.  HENT:     Head: Normocephalic.     Right Ear: External ear normal.     Left Ear: External ear normal.     Nose: Nose normal.  Eyes:     General:        Right eye: No discharge.        Left eye: No discharge.     Conjunctiva/sclera: Conjunctivae normal.     Pupils: Pupils are equal, round, and reactive to light.  Neck:     Musculoskeletal: Normal range of motion and neck supple.     Thyroid: No thyromegaly.     Vascular: No JVD.     Trachea: No tracheal deviation.  Cardiovascular:     Rate and Rhythm: Normal rate and regular rhythm.     Heart sounds: Normal heart sounds.  Pulmonary:     Effort: No respiratory distress.     Breath sounds: No stridor. No wheezing.  Abdominal:     General: Bowel sounds are normal. There is no distension.     Palpations: Abdomen is soft. There is no mass.     Tenderness: There is no abdominal tenderness. There is no guarding or rebound.  Musculoskeletal:  General: No tenderness.  Lymphadenopathy:     Cervical: No cervical adenopathy.  Skin:    Findings: No erythema or rash.  Neurological:     Cranial Nerves: No cranial nerve deficit.     Motor: No abnormal muscle tone.     Coordination: Coordination normal.     Deep Tendon Reflexes: Reflexes normal.  Psychiatric:        Behavior: Behavior normal.        Thought Content: Thought content normal.        Judgment: Judgment normal.   looks more frail now now No tremor Alert/coop Clock drawing with hands positioned wrong  Lab Results  Component Value Date   WBC 9.1 02/13/2017   HGB 14.9 02/13/2017   HCT 44.0 02/13/2017   PLT 300.0 02/13/2017   GLUCOSE 92 07/28/2017   CHOL 165 07/28/2017   TRIG 79.0 07/28/2017   HDL 48.10 07/28/2017   LDLDIRECT 132.3 01/28/2009   LDLCALC 101 (H) 07/28/2017   ALT 9 02/13/2017   ALT 9 02/13/2017   AST 17  02/13/2017   AST 17 02/13/2017   NA 135 07/28/2017   K 3.5 07/28/2017   CL 101 07/28/2017   CREATININE 0.77 07/28/2017   BUN 12 07/28/2017   CO2 25 07/28/2017   TSH 0.73 02/13/2017    Dg Bone Density (dxa)  Result Date: 09/26/2017 Date of study: 09/25/2017 Exam: DUAL X-RAY ABSORPTIOMETRY (DXA) FOR BONE MINERAL DENSITY (BMD) Instrument: [employer] Therapist, [facility] Requesting Provider: PCP Indication: screening  for low BMD Comparison: none (please note that it is not possible to compare data from different instruments) Clinical data: Pt is a 68 y.o. female without previous history of fracture. On vitamin D. Results:  Lumbar spine L1-L4 (L2) Femoral neck (FN) T-score +0.8 RFN: -1.5 LFN: -1.5 Assessment: the BMD is low according to the [facility] classification for osteoporosis (see below). Fracture risk: moderate FRAX score: 10 year major osteoporotic risk: 9.1%. 10 year hip fracture risk: 1.0%. The thresholds for treatment are 20% and 3%, respectively. Comments: the technical quality of the study is good, however L2 vertebra had to be excluded from analysis due to degenerative changes. Evaluation for secondary causes should be considered if clinically indicated. Recommend optimizing calcium (1200 mg/day) and vitamin D (800 IU/day) intake. Followup: Repeat BMD is appropriate after 2 years. WHO criteria for diagnosis of osteoporosis in postmenopausal women and in men 68 y/o or older: - normal: T-score -1.0 to + 1.0 - osteopenia/low bone density: T-score between -2.5 and -1.0 - osteoporosis: T-score below -2.5 - severe osteoporosis: T-score below -2.5 with history of fragility fracture Note: although not part of the WHO classification, the presence of a fragility fracture, regardless of the T-score, should be considered diagnostic of osteoporosis, provided other causes for the fracture have been excluded. Treatment: The National Osteoporosis Foundation recommends that treatment be considered in postmenopausal women and  men age 68 or older with: 1. Hip or vertebral (clinical or morphometric) fracture 2. T-score of - 2.5 or lower at the spine or hip 3. 10-year fracture probability by FRAX of at least 20% for a major osteoporotic fracture and 3% for a hip fracture Carlus Pavlovristina Gherghe, MD [facility]    Assessment & Plan:   Diagnoses and all orders for this visit:  Need for influenza vaccination -     Flu Vaccine QUAD High Dose(Fluad)     No orders of the defined types were placed in this encounter.    Follow-up: No follow-ups on file.  Walker Kehr, MD

## 2019-06-12 NOTE — Assessment & Plan Note (Addendum)
c/o short term memory loss and fraility x months per niece Neurol ref offered Clock drawing - messed up hands Neurol ref offered Had a brain CT 2018 Clock drawing with hands positioned wrong

## 2019-08-29 NOTE — Progress Notes (Addendum)
Subjective:   [name] is a 68 y.o. female who presents for Medicare Annual (Subsequent) preventive examination. I connected with patient by a telephone and verified that I am speaking with the correct person using two identifiers. Patient stated full name and DOB. Patient gave permission to continue with telephonic visit. Patient's location was at home and Nurse's location was at [place] office. Participants during this visit included patient and nurse.  Review of Systems:     Sleep patterns: feels rested on waking, gets up 0 times nightly to void and sleeps 8 hours nightly.    Home Safety/Smoke Alarms: Feels safe in home. Smoke alarms in place.  Living environment; residence and Firearm Safety: 2-story house. Lives alone, no needs for DME, good support system Seat Belt Safety/Bike Helmet: Wears seat belt.      Objective:     Vitals: There were no vitals taken for this visit.  There is no height or weight on file to calculate BMI.  Advanced Directives 08/30/2019 08/24/2018 07/31/2017 07/12/2017  Does Patient Have a Medical Advance Directive? No No No No  Would patient like information on creating a medical advance directive? Yes (ED - Information included in AVS) Yes (ED - Information included in AVS) Yes (ED - Information included in AVS) -    Tobacco Social History   Tobacco Use  Smoking Status Never Smoker  Smokeless Tobacco Never Used     Counseling given: Not Answered  Past Medical History:  Diagnosis Date  . Depression   . Hyperlipidemia   . LBP (low back pain)   . OA (osteoarthritis)   . Vitamin B 12 deficiency    Past Surgical History:  Procedure Laterality Date  . CHOLECYSTECTOMY  1990   Family History  Problem Relation Age of Onset  . Pneumonia Father   . Diabetes Mother   . Heart disease Mother   . Hypertension Mother   . Pancreatic cancer Mother   . Liver cancer Mother   . Diabetes Other   . Hypertension Other   . Colon cancer Maternal  Grandmother 61   Social History   Socioeconomic History  . Marital status: Single    Spouse name: Not on file  . Number of children: 0  . Years of education: 26  . Highest education level: Not on file  Occupational History  . Occupation: retired  Scientific laboratory technician  . Financial resource strain: Not hard at all  . Food insecurity    Worry: Never true    Inability: Never true  . Transportation needs    Medical: No    Non-medical: No  Tobacco Use  . Smoking status: Never Smoker  . Smokeless tobacco: Never Used  Substance and Sexual Activity  . Alcohol use: No  . Drug use: No  . Sexual activity: Not Currently  Lifestyle  . Physical activity    Days per week: 0 days    Minutes per session: 0 min  . Stress: Only a little  Relationships  . Social connections    Talks on phone: More than three times a week    Gets together: More than three times a week    Attends religious service: 1 to 4 times per year    Active member of club or organization: Yes    Attends meetings of clubs or organizations: More than 4 times per year    Relationship status: Not on file  Other Topics Concern  . Not on file  Social History Narrative  Lives alone in a 1 1/2 story home.  No children.  Retired from [employer].  Education: high school.      Outpatient Encounter Medications as of 08/30/2019  Medication Sig  . acetaminophen (TYLENOL) 500 MG tablet Take 500 mg by mouth every 6 (six) hours as needed.  . Cholecalciferol 1000 UNITS capsule Take 1,000 Units by mouth daily.  . citalopram (CELEXA) 20 MG tablet Take 20 mg by mouth daily.  . cyanocobalamin (,VITAMIN B-12,) 1000 MCG/ML injection USE AS DIRECTED EVERY 2 WEEKS  . aspirin 81 MG tablet Take 81 mg by mouth daily.    Marland Kitchen. loratadine (CLARITIN) 10 MG tablet Take 1 tablet (10 mg total) by mouth daily. (Patient taking differently: Take 10 mg by mouth daily as needed. )  . [DISCONTINUED] Ranitidine HCl (ZANTAC PO) Take by mouth as needed.     No  facility-administered encounter medications on file as of 08/30/2019.     Activities of Daily Living In your present state of health, do you have any difficulty performing the following activities: 08/30/2019  Hearing? N  Vision? N  Difficulty concentrating or making decisions? N  Walking or climbing stairs? N  Dressing or bathing? N  Doing errands, shopping? N  Preparing Food and eating ? N  Using the Toilet? N  In the past six months, have you accidently leaked urine? N  Do you have problems with loss of bowel control? N  Managing your Medications? N  Managing your Finances? N  Housekeeping or managing your Housekeeping? N  Some recent data might be hidden    Patient Care Team: Plotnikov, Georgina QuintAleksei V, MD as PCP - General Jethro BolusShapiro, Mark, MD as Consulting Physician (Ophthalmology)    Assessment:   This is a routine wellness examination for Lupita LeashDonna. Physical assessment deferred to PCP.  Exercise Activities and Dietary recommendations Current Exercise Habits: The patient does not participate in regular exercise at present Diet (meal preparation, eat out, water intake, caffeinated beverages, dairy products, fruits and vegetables): in general, a "healthy" diet     Reviewed heart healthy diet. Encouraged patient to increase daily water and healthy fluid intake.  Goals    . Patient Stated     I am going to try to get on the bicycle more often and eat healthier.    . Patient Stated     I want to increase my physical activity by getting riding my more routinely.       Fall Risk Fall Risk  08/30/2019 08/24/2018 07/31/2017 03/30/2017 02/17/2017  Falls in the past year? 0 0 Yes Yes Yes  Number falls in past yr: 0 - 1 1 1   Injury with Fall? 0 - Yes No No  Risk for fall due to : - - - Other (Comment) -  Follow up - - - Falls evaluation completed;Education provided;Falls prevention discussed -   Is the patient's home free of loose throw rugs in walkways, pet beds, electrical cords, etc?    yes      Grab bars in the bathroom? no      Handrails on the stairs?   yes      Adequate lighting?   yes  Depression Screen PHQ 2/9 Scores 08/30/2019 08/24/2018 07/31/2017 02/17/2017  PHQ - 2 Score 1 0 2 0  PHQ- 9 Score - 0 4 -     Cognitive Function MMSE - Mini Mental State Exam 08/24/2018  Orientation to time 5  Orientation to Place 5  Registration 3  Attention/  Calculation 5  Recall 0  Language- name 2 objects 2  Language- repeat 1  Language- follow 3 step command 3  Language- read & follow direction 1  Write a sentence 1  Copy design 1  Total score 27   Montreal Cognitive Assessment  03/30/2017  Visuospatial/ Executive (0/5) 2  Naming (0/3) 2  Attention: Read list of digits (0/2) 1  Attention: Read list of letters (0/1) 1  Attention: Serial 7 subtraction starting at 100 (0/3) 2  Language: Repeat phrase (0/2) 1  Language : Fluency (0/1) 1  Abstraction (0/2) 2  Delayed Recall (0/5) 1  Orientation (0/6) 6  Total 19  Adjusted Score (based on education) 20   6CIT Screen 08/30/2019  What Year? 0 points  What month? 0 points  Count back from 20 0 points  Months in reverse 2 points  Repeat phrase 2 points    Immunization History  Administered Date(s) Administered  . Fluad Quad(high Dose 65+) 06/12/2019  . Influenza Whole 08/14/2002, 09/10/2007, 08/10/2010  . Influenza, High Dose Seasonal PF 08/26/2016, 07/31/2017, 08/24/2018  . Influenza,inj,Quad PF,6+ Mos 08/04/2014  . Influenza-Unspecified 07/31/2015  . Pneumococcal Conjugate-13 08/26/2016  . Pneumococcal Polysaccharide-23 08/28/2001, 02/17/2017  . Td 11/09/1998, 02/17/2010  . Zoster 06/07/2012   Screening Tests Health Maintenance  Topic Date Due  . Hepatitis C Screening  1951-09-08  . MAMMOGRAM  10/19/2018  . TETANUS/TDAP  02/18/2020  . COLONOSCOPY  03/21/2023  . INFLUENZA VACCINE  Completed  . DEXA SCAN  Completed  . PNA vac Low Risk Adult  Completed      Plan:     Reviewed health maintenance  screenings with patient today and relevant education, vaccines, and/or referrals were provided.   I have personally reviewed and noted the following in the patient's chart:   . Medical and social history . Use of alcohol, tobacco or illicit drugs  . Current medications and supplements . Functional ability and status . Nutritional status . Physical activity . Advanced directives . List of other physicians . Vitals . Screenings to include cognitive, depression, and falls . Referrals and appointments  In addition, I have reviewed and discussed with patient certain preventive protocols, quality metrics, and best practice recommendations. A written personalized care plan for preventive services as well as general preventive health recommendations were provided to patient.     Wanda Plump, RN  08/30/2019    Medical screening examination/treatment/procedure(s) were performed by non-physician practitioner and as supervising physician I was immediately available for consultation/collaboration. I agree with above. Oliver Barre, MD

## 2019-08-30 ENCOUNTER — Ambulatory Visit (INDEPENDENT_AMBULATORY_CARE_PROVIDER_SITE_OTHER): Payer: Medicare Other | Admitting: *Deleted

## 2019-08-30 ENCOUNTER — Other Ambulatory Visit: Payer: Self-pay

## 2019-08-30 DIAGNOSIS — Z0000 Encounter for general adult medical examination without abnormal findings: Secondary | ICD-10-CM

## 2019-08-30 NOTE — Patient Instructions (Addendum)
Continue doing brain stimulating activities (puzzles, reading, adult coloring books, staying active) to keep memory sharp.   Continue to eat heart healthy diet (full of fruits, vegetables, whole grains, lean protein, water--limit salt, fat, and sugar intake) and increase physical activity as tolerated.  If you cannot attend class in person, you can still exercise at home. Video taped versions of [employer] classes are shown on [employer] (GTN) at 8 am and 1 pm Mondays through Fridays. You can also purchase a copy of the AHOY DVD by calling [place] (GTN) [employer]. GTN is available on [employer] channel 13 with a digital cable box and on [employer] channel 31. GTN is also available on [employer], channel 99. To view GTN, go to channel 99, press OK, select , then select GTN to start the channel.  Ms. Connolly , Thank you for taking time to come for your Medicare Wellness Visit. I appreciate your ongoing commitment to your health goals. Please review the following plan we discussed and let me know if I can assist you in the future.   These are the goals we discussed: Goals    . Patient Stated     I am going to try to get on the bicycle more often and eat healthier.    . Patient Stated     I want to increase my physical activity by getting riding my more routinely.       This is a list of the screening recommended for you and due dates:  Health Maintenance  Topic Date Due  .  Hepatitis C: One time screening is recommended by Center for Disease Control  (CDC) for  adults born from [birth year] through 1965.   [birth year]  . Mammogram  10/19/2018  . Tetanus Vaccine  02/18/2020  . Colon Cancer Screening  03/21/2023  . Flu Shot  Completed  . DEXA scan (bone density measurement)  Completed  . Pneumonia vaccines  Completed    Preventive Care 68 Years and Older, Female Preventive care refers to lifestyle choices and visits with your health  care provider that can promote health and wellness. This includes:  A yearly physical exam. This is also called an annual well check.  Regular dental and eye exams.  Immunizations.  Screening for certain conditions.  Healthy lifestyle choices, such as diet and exercise. What can I expect for my preventive care visit? Physical exam Your health care provider will check:  Height and weight. These may be used to calculate body mass index (BMI), which is a measurement that tells if you are at a healthy weight.  Heart rate and blood pressure.  Your skin for abnormal spots. Counseling Your health care provider may ask you questions about:  Alcohol, tobacco, and drug use.  Emotional well-being.  Home and relationship well-being.  Sexual activity.  Eating habits.  History of falls.  Memory and ability to understand (cognition).  Work and work Statistician.  Pregnancy and menstrual history. What immunizations do I need?  Influenza (flu) vaccine  This is recommended every year. Tetanus, diphtheria, and pertussis (Tdap) vaccine  You may need a Td booster every 10 years. Varicella (chickenpox) vaccine  You may need this vaccine if you have not already been vaccinated. Zoster (shingles) vaccine  You may need this after age 17. Pneumococcal conjugate (PCV13) vaccine  One dose is recommended after age 52. Pneumococcal polysaccharide (PPSV23) vaccine  One dose is recommended after age 52. Measles, mumps, and rubella (MMR) vaccine  You may need at least one dose of MMR if you were born in 1957 or later. You may also need a second dose. Meningococcal conjugate (MenACWY) vaccine  You may need this if you have certain conditions. Hepatitis A vaccine  You may need this if you have certain conditions or if you travel or work in places where you may be exposed to hepatitis A. Hepatitis B vaccine  You may need this if you have certain conditions or if you travel or work  in places where you may be exposed to hepatitis B. Haemophilus influenzae type b (Hib) vaccine  You may need this if you have certain conditions. You may receive vaccines as individual doses or as more than one vaccine together in one shot (combination vaccines). Talk with your health care provider about the risks and benefits of combination vaccines. What tests do I need? Blood tests  Lipid and cholesterol levels. These may be checked every 5 years, or more frequently depending on your overall health.  Hepatitis C test.  Hepatitis B test. Screening  Lung cancer screening. You may have this screening every year starting at age 25 if you have a 30-pack-year history of smoking and currently smoke or have quit within the past 15 years.  Colorectal cancer screening. All adults should have this screening starting at age 62 and continuing until age 41. Your health care provider may recommend screening at age 36 if you are at increased risk. You will have tests every 1-10 years, depending on your results and the type of screening test.  Diabetes screening. This is done by checking your blood sugar (glucose) after you have not eaten for a while (fasting). You may have this done every 1-3 years.  Mammogram. This may be done every 1-2 years. Talk with your health care provider about how often you should have regular mammograms.  BRCA-related cancer screening. This may be done if you have a family history of breast, ovarian, tubal, or peritoneal cancers. Other tests  Sexually transmitted disease (STD) testing.  Bone density scan. This is done to screen for osteoporosis. You may have this done starting at age 38. Follow these instructions at home: Eating and drinking  Eat a diet that includes fresh fruits and vegetables, whole grains, lean protein, and low-fat dairy products. Limit your intake of foods with high amounts of sugar, saturated fats, and salt.  Take vitamin and mineral supplements  as recommended by your health care provider.  Do not drink alcohol if your health care provider tells you not to drink.  If you drink alcohol: ? Limit how much you have to 0-1 drink a day. ? Be aware of how much alcohol is in your drink. In the [place] one drink equals one 12 oz bottle of beer (355 mL), one 5 oz glass of  (148 mL), or one 1 oz glass of hard liquor (44 mL). Lifestyle  Take daily care of your teeth and gums.  Stay active. Exercise for at least 30 minutes on 5 or more days each week.  Do not use any products that contain nicotine or tobacco, such as cigarettes, e-cigarettes, and chewing tobacco. If you need help quitting, ask your health care provider.  If you are sexually active, practice safe sex. Use a condom or other form of protection in order to prevent STIs (sexually transmitted infections).  Talk with your health care provider about taking a low-dose aspirin or statin. What's next?  Go to your health care provider once  a year for a well check visit.  Ask your health care provider how often you should have your eyes and teeth checked.  Stay up to date on all vaccines. This information is not intended to replace advice given to you by your health care provider. Make sure you discuss any questions you have with your health care provider. Document Released: 11/06/2015 Document Revised: 10/04/2018 Document Reviewed: 10/04/2018 Elsevier Patient Education  2020 [employer].

## 2019-10-09 ENCOUNTER — Telehealth: Payer: Self-pay | Admitting: Internal Medicine

## 2019-10-09 DIAGNOSIS — R413 Other amnesia: Secondary | ICD-10-CM

## 2019-10-09 NOTE — Telephone Encounter (Signed)
Patient sister [name] called and would like to speak with Dr. Alain Marion CMA regading her sister and her health condition. Please return call, thanks.

## 2019-10-09 NOTE — Telephone Encounter (Signed)
Pts sister states pts memory has got worse and would like for a referral to neurology to see if there is anything they can do to help the patient.  Sister would like to contacted about referral.

## 2019-10-10 NOTE — Telephone Encounter (Signed)
Ok will do [employer]

## 2019-10-10 NOTE — Addendum Note (Signed)
Addended by: Karren Cobble on: 10/10/2019 02:20 PM   Modules accepted: Orders

## 2019-10-14 ENCOUNTER — Encounter: Payer: Self-pay | Admitting: Neurology

## 2019-10-14 NOTE — Progress Notes (Signed)
Virtual Visit via Video Note The purpose of this virtual visit is to provide medical care while limiting exposure to the novel coronavirus.    Consent was obtained for video visit:  Yes.   Answered questions that patient had about telehealth interaction:  Yes.   I discussed the limitations, risks, security and privacy concerns of performing an evaluation and management service by telemedicine. I also discussed with the patient that there may be a patient responsible charge related to this service. The patient expressed understanding and agreed to proceed.  Pt location: Home Physician Location: office Name of referring provider:  Plotnikov, Evie Lacks, MD I connected with [name] at patients initiation/request on 10/15/2019 at  68:50 AM EST by video enabled telemedicine application and verified that I am speaking with the correct person using two identifiers. Pt MRN:  [id number] Pt DOB:  [birth year] Video Participants:  [name];  Niece   History of Present Illness:  [name] is a 68 year old right-handed female with hypertension, dyslipidemia, depression and IBS who follows up for memory changes.  UPDATE: Last seen in June 2018. She underwent neuropsychological testing on 05/18/2017 by Dr. Si Raider which demonstrated amnestic mild cognitive impaiirment.  At that time, I ordered an MRI of brain, which she did not have performed and did not follow up with me.  She has had increased memory difficulties over past several months.. She frequently forgets conversations quickly.  She forgets where some familiar places are, such as the dentist.  However, it had been a year since she last saw the dentist.  Otherwise, no disorientation on familiar routes.  She remains independent.  She lives by herself.  She pays her own bills.  She has forgotten to pay 2 bills but otherwise no issues.  She reports that she took herself off of her citalopram several months ago and just recently  restarted it.  She is taking B12 injections.   06/12/2019 LABS:  B12 550; TSH 0.77.  HISTORY: She reports that she sustained a concussion in 2013 when she tripped and fell, hitting her head on the sidewalk.  She hit the right frontal region, sustaining a black eye.  She felt slightly dizzy but she did not lose consciousness, feel confused, felt nauseous or vomited, noted double vision or have a headache.  At the time, she stayed out of work and rested for about a week.  Since then, she has noted some mild short-term memory issues, really only limited to frequently misplacing objects, which she is able to quickly retrieve.     In April 2018, she was driving home from her sister's house.  She began to drive off route and didn't realize it until about a mile later, at which point she turned around and drove home.  She did not lose awareness and was not disoriented.  She just started driving in a different direction while on a familiar route.  There was no associated headache, nausea, dizziness, visual disturbance or unilateral numbness or weakness.  Her sister had been suffering from sciatica and so she was driving back and forth to her sister's place in order to help out.  She had been exhausted and had even taken a nap prior to driving home.  She had a second fall in September 2018, in which she hit her head.  CT head performed at that time     Her father had dementia.  Past Medical History: Past Medical History:  Diagnosis Date  .  Depression   . Hyperlipidemia   . LBP (low back pain)   . OA (osteoarthritis)   . Vitamin B 12 deficiency     Medications: Outpatient Encounter Medications as of 10/15/2019  Medication Sig  . acetaminophen (TYLENOL) 500 MG tablet Take 500 mg by mouth every 6 (six) hours as needed.  Marland Kitchen aspirin 81 MG tablet Take 81 mg by mouth daily.    . Cholecalciferol 1000 UNITS capsule Take 1,000 Units by mouth daily.  . citalopram (CELEXA) 20 MG tablet Take 20 mg by mouth  daily.  . cyanocobalamin (,VITAMIN B-12,) 1000 MCG/ML injection USE AS DIRECTED EVERY 2 WEEKS  . loratadine (CLARITIN) 10 MG tablet Take 1 tablet (10 mg total) by mouth daily. (Patient taking differently: Take 10 mg by mouth daily as needed. )   No facility-administered encounter medications on file as of 10/15/2019.    Allergies: Allergies  Allergen Reactions  . Erythromycin Base Nausea Only  . Other Nausea And Vomiting and Other (See Comments)    Mycins-dizziness    Family History: Family History  Problem Relation Age of Onset  . Pneumonia Father   . Diabetes Mother   . Heart disease Mother   . Hypertension Mother   . Pancreatic cancer Mother   . Liver cancer Mother   . Diabetes Other   . Hypertension Other   . Colon cancer Maternal Grandmother 54    Social History: Social History   Socioeconomic History  . Marital status: Single    Spouse name: Not on file  . Number of children: 0  . Years of education: 26  . Highest education level: Not on file  Occupational History  . Occupation: retired  Tobacco Use  . Smoking status: Never Smoker  . Smokeless tobacco: Never Used  Substance and Sexual Activity  . Alcohol use: No  . Drug use: No  . Sexual activity: Not Currently  Other Topics Concern  . Not on file  Social History Narrative   Lives alone in a 1 1/2 story home.  No children.  Retired from [employer].  Education: high school.     Social Determinants of Health   Financial Resource Strain:   . Difficulty of Paying Living Expenses: Not on file  Food Insecurity:   . Worried About Programme researcher, broadcasting/film/video in the Last Year: Not on file  . Ran Out of Food in the Last Year: Not on file  Transportation Needs:   . Lack of Transportation (Medical): Not on file  . Lack of Transportation (Non-Medical): Not on file  Physical Activity: Inactive  . Days of Exercise per Week: 0 days  . Minutes of Exercise per Session: 0 min  Stress: No Stress Concern Present  . Feeling  of Stress : Only a little  Social Connections:   . Frequency of Communication with Friends and Family: Not on file  . Frequency of Social Gatherings with Friends and Family: Not on file  . Attends Religious Services: Not on file  . Active Member of Clubs or Organizations: Not on file  . Attends Banker Meetings: Not on file  . Marital Status: Not on file  Intimate Partner Violence: Unknown  . Fear of Current or Ex-Partner: Not asked  . Emotionally Abused: Not asked  . Physically Abused: Not asked  . Sexually Abused: Not asked    Observations/Objective:   There were no vitals taken for this visit. No acute distress.  Alert and oriented.  Speech fluent and  not dysarthric.  Language intact.   Montreal Cognitive Assessment Blind 10/15/2019 03/30/2017  Attention: Read list of digits (0/2) 1 1  Attention: Read list of letters (0/1) 1 1  Attention: Serial 7 subtraction starting at 100 (0/3) 1 2  Language: Repeat phrase (0/2) 1 1  Language : Fluency (0/1) 1 1  Abstraction (0/2) 1 2  Delayed Recall (0/5) 0 1  Orientation (0/6) 4 6  Total 10 -  Adjusted Score (based on education) 11 -   Eyes orthophoric on primary gaze.  Face symmetric.   Assessment and Plan:   1.  Mild neurocognitive disorder.  Memory slightly worse over past few months.  May be due to increased depression. 2.  Depression.  1.  Continue citalopram 20mg  daily 2.  MRI of brain without contrast 3.  Neuropsychological testing.  If there has been a notable cognitive decline, will start donepezil. 4.  Follow up in 6 months  Follow Up Instructions:    -I discussed the assessment and treatment plan with the patient. The patient was provided an opportunity to ask questions and all were answered. The patient agreed with the plan and demonstrated an understanding of the instructions.   The patient was advised to call back or seek an in-person evaluation if the symptoms worsen or if the condition fails to improve  as anticipated.    Total Time spent in visit with the patient was:  25 minutes  Cira ServantAdam Robert , DO

## 2019-10-15 ENCOUNTER — Telehealth (INDEPENDENT_AMBULATORY_CARE_PROVIDER_SITE_OTHER): Payer: Medicare Other | Admitting: Neurology

## 2019-10-15 ENCOUNTER — Other Ambulatory Visit: Payer: Self-pay

## 2019-10-15 ENCOUNTER — Ambulatory Visit (INDEPENDENT_AMBULATORY_CARE_PROVIDER_SITE_OTHER): Payer: Medicare Other | Admitting: Neurology

## 2019-10-15 DIAGNOSIS — G3184 Mild cognitive impairment, so stated: Secondary | ICD-10-CM

## 2019-10-15 DIAGNOSIS — F32A Depression, unspecified: Secondary | ICD-10-CM

## 2019-10-15 DIAGNOSIS — F329 Major depressive disorder, single episode, unspecified: Secondary | ICD-10-CM

## 2019-10-28 ENCOUNTER — Other Ambulatory Visit: Payer: Self-pay | Admitting: Internal Medicine

## 2019-11-12 ENCOUNTER — Other Ambulatory Visit: Payer: Self-pay | Admitting: Internal Medicine

## 2019-12-23 ENCOUNTER — Encounter: Payer: Medicare Other | Admitting: Psychology

## 2020-01-04 ENCOUNTER — Ambulatory Visit
Admission: EM | Admit: 2020-01-04 | Discharge: 2020-01-04 | Disposition: A | Discharge status: Home / Self Care | Payer: Medicare PPO | Source: Home / Self Care

## 2020-01-04 ENCOUNTER — Emergency Department (HOSPITAL_COMMUNITY)
Admission: EM | Admit: 2020-01-04 | Discharge: 2020-01-04 | Disposition: A | Discharge status: Home / Self Care | Payer: Medicare PPO | Attending: Emergency Medicine | Admitting: Emergency Medicine

## 2020-01-04 ENCOUNTER — Encounter (HOSPITAL_COMMUNITY): Payer: Self-pay | Admitting: Emergency Medicine

## 2020-01-04 ENCOUNTER — Other Ambulatory Visit: Payer: Self-pay

## 2020-01-04 DIAGNOSIS — W548XXA Other contact with dog, initial encounter: Secondary | ICD-10-CM | POA: Insufficient documentation

## 2020-01-04 DIAGNOSIS — S60512A Abrasion of left hand, initial encounter: Secondary | ICD-10-CM | POA: Insufficient documentation

## 2020-01-04 DIAGNOSIS — Z23 Encounter for immunization: Secondary | ICD-10-CM | POA: Diagnosis not present

## 2020-01-04 DIAGNOSIS — S61412A Laceration without foreign body of left hand, initial encounter: Secondary | ICD-10-CM

## 2020-01-04 DIAGNOSIS — Y929 Unspecified place or not applicable: Secondary | ICD-10-CM | POA: Insufficient documentation

## 2020-01-04 DIAGNOSIS — Y999 Unspecified external cause status: Secondary | ICD-10-CM | POA: Diagnosis not present

## 2020-01-04 DIAGNOSIS — Y9389 Activity, other specified: Secondary | ICD-10-CM | POA: Insufficient documentation

## 2020-01-04 MED ORDER — DOXYCYCLINE HYCLATE 100 MG PO CAPS: 100.0000 mg | ORAL_CAPSULE | Freq: Two times a day (BID) | ORAL | 0 refills | Status: DC

## 2020-01-04 MED ORDER — TETANUS-DIPHTH-ACELL PERTUSSIS 5-2.5-18.5 LF-MCG/0.5 IM SUSP
0.5000 mL | Freq: Once | INTRAMUSCULAR | Status: AC
  Administered 2020-01-04: 0.5 mL via INTRAMUSCULAR
  Filled 2020-01-04: qty 0.5

## 2020-01-04 NOTE — Discharge Instructions (Addendum)
Local wound care with bacitracin and dressing changes twice daily.  Fill the prescription for doxycycline you have been given if you develop increased redness, increased pain, purulent drainage from the wound, and return to the ER for other new and concerning symptoms.

## 2020-01-04 NOTE — ED Provider Notes (Signed)
[facility] EMERGENCY DEPARTMENT Provider Note   CSN: [id number] Arrival date & time: 01/04/20  1318     History Chief Complaint  Patient presents with  . Animal Bite    [name] is a 69 y.o. female.  Patient is a 69 year old female with past medical history of hyperlipidemia, osteoarthritis.  She presents today for evaluation of a left hand laceration.  Patient's sister's puppy jumped up on her yesterday and scratched her left hand with its claws.  She has a small skin tear to the dorsum of the hand.  She was seen by urgent care and referred here for consideration of rabies series.  Patient tells me that the dog did not bite her.  I am told that the puppy shots are up-to-date and her sister intends on keeping the animal.  Patient's last tetanus is unknown.  The history is provided by the patient.       Past Medical History:  Diagnosis Date  . Depression   . Hyperlipidemia   . LBP (low back pain)   . OA (osteoarthritis)   . Vitamin B 12 deficiency     Patient Active Problem List   Diagnosis Date Noted  . Memory problem 06/12/2019  . Altered consciousness 02/17/2017  . Cough 07/28/2016  . Alopecia areata 05/30/2014  . Pneumonia due to organism 05/05/2014  . Tendinopathy of left biceps tendon 10/04/2013  . Trigger finger, acquired 10/04/2013  . Left shoulder pain 09/03/2013  . 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 Onychomycosis of toenail 01/22/2013  . Paronychia of great toe of left foot 01/21/2013  . Abdominal tenderness, LUQ (left upper quadrant) 09/09/2011  . IBS (irritable bowel syndrome) 09/09/2011  . Acute sialoadenitis 02/28/2011  . Well adult exam 02/28/2011  . SKIN RASH 09/03/2010  . ABDOMINAL PAIN, LOWER 02/02/2009  . Essential hypertension 09/26/2008  . ELEVATED BLOOD PRESSURE WITHOUT DIAGNOSIS OF HYPERTENSION 09/12/2008  . CONTACT DERMATITIS&OTHER ECZEMA DUE TO PLANTS 02/13/2008  . B12 deficiency 11/13/2007  . Dyslipidemia 11/13/2007    . LOW BACK PAIN 11/13/2007  . NONSPECIFIC ABNORMAL RESULTS LIVR FUNCTION STUDY 11/13/2007  . Chronic depression 07/14/2007  . OSTEOARTHRITIS 07/14/2007    Past Surgical History:  Procedure Laterality Date  . CHOLECYSTECTOMY  1990     OB History   No obstetric history on file.     Family History  Problem Relation Age of Onset  . Pneumonia Father   . Diabetes Mother   . Heart disease Mother   . Hypertension Mother   . Pancreatic cancer Mother   . Liver cancer Mother   . Diabetes Other   . Hypertension Other   . Colon cancer Maternal Grandmother 51    Social History   Tobacco Use  . Smoking status: Never Smoker  . Smokeless tobacco: Never Used  Substance Use Topics  . Alcohol use: No  . Drug use: No    Home Medications Prior to Admission medications   Medication Sig Start Date End Date Taking? Authorizing Provider  acetaminophen (TYLENOL) 500 MG tablet Take 500 mg by mouth every 6 (six) hours as needed.    [provider]  aspirin 81 MG tablet Take 81 mg by mouth daily.      [provider]  Cholecalciferol 1000 UNITS capsule Take 1,000 Units by mouth daily.    [provider]  citalopram (CELEXA) 20 MG tablet Take 1 tablet (20 mg total) by mouth daily. 11/12/19   Plotnikov, Georgina Quint, MD  cyanocobalamin (,  VITAMIN B-12,) 1000 MCG/ML injection USE AS DIRECTED EVERY 2 WEEKS 10/29/19   Plotnikov, Evie Lacks, MD  loratadine (CLARITIN) 10 MG tablet Take 1 tablet (10 mg total) by mouth daily. Patient taking differently: Take 10 mg by mouth daily as needed.  07/28/16 07/28/17  Plotnikov, Evie Lacks, MD    Allergies    Erythromycin base and Other  Review of Systems   Review of Systems  All other systems reviewed and are negative.   Physical Exam Updated Vital Signs BP (!) 153/85 (BP Location: Right Arm)   Pulse 92   Temp 98 F (36.7 C) (Oral)   Resp 18   Ht 5\' 5"  (1.651 m)   Wt 71 kg   SpO2 98%   BMI 26.05 kg/m   Physical Exam Vitals  and nursing note reviewed.  Constitutional:      General: She is not in acute distress.    Appearance: Normal appearance. She is not ill-appearing.  HENT:     Head: Normocephalic and atraumatic.  Pulmonary:     Effort: Pulmonary effort is normal.  Musculoskeletal:     Comments: The dorsum of the left hand has a small, less than 2 cm, V-shaped skin tear.  It is superficial and does not extend into the dermis.  There is no tendon involvement and patient has full range of motion of the hand.  Skin:    General: Skin is warm and dry.  Neurological:     Mental Status: She is alert.     ED Results / Procedures / Treatments   Labs (all labs ordered are listed, but only abnormal results are displayed) Labs Reviewed - No data to display  EKG None  Radiology No results found.  Procedures Procedures (including critical care time)  Medications Ordered in ED Medications  Tdap (BOOSTRIX) injection 0.5 mL (has no administration in time range)    ED Course  I have reviewed the triage vital signs and the nursing notes.  Pertinent labs & imaging results that were available during my care of the patient were reviewed by me and considered in my medical decision making (see chart for details).    MDM Rules/Calculators/A&P  As this injury was not a bite, but a scratch with the dog's nail I see no indication for rabies series.  Also, the dog's owner has the animal and has the intention to keep it.  The dog can be quarantined for 10 days.  Patient's tetanus will be updated and all I feel the wound will require is local wound care.  Patient will be given a prescription for doxycycline which she can fill if redness or pus occurs.  Final Clinical Impression(s) / ED Diagnoses Final diagnoses:  None    Rx / DC Orders ED Discharge Orders    None       Veryl Speak, MD 01/04/20 1416

## 2020-01-04 NOTE — ED Triage Notes (Addendum)
Pt states her sister's puppy jumped up and scratched her hand causing a skin tear. Puppy is UTD on shots (due for rabies booster as puppy vaccines), pt is not UTD on tetanus. Urgent care sent for rabies series.

## 2020-01-04 NOTE — ED Triage Notes (Signed)
Pt presents to UC w/ c/o dog scratch yesterday. Dog is sister's dog, with up to date shots except rabies. Pt has scratch/skin tear on top of left hand.

## 2020-01-13 ENCOUNTER — Encounter: Payer: Medicare Other | Admitting: Psychology

## 2020-01-22 ENCOUNTER — Telehealth: Payer: Self-pay

## 2020-01-22 ENCOUNTER — Encounter: Payer: Self-pay | Admitting: Counselor

## 2020-01-22 ENCOUNTER — Ambulatory Visit (INDEPENDENT_AMBULATORY_CARE_PROVIDER_SITE_OTHER): Payer: Medicare PPO | Admitting: Counselor

## 2020-01-22 ENCOUNTER — Ambulatory Visit: Payer: Medicare PPO | Admitting: Psychology

## 2020-01-22 ENCOUNTER — Other Ambulatory Visit: Payer: Self-pay

## 2020-01-22 DIAGNOSIS — R453 Demoralization and apathy: Secondary | ICD-10-CM

## 2020-01-22 DIAGNOSIS — F0281 Dementia in other diseases classified elsewhere with behavioral disturbance: Secondary | ICD-10-CM

## 2020-01-22 DIAGNOSIS — R413 Other amnesia: Secondary | ICD-10-CM

## 2020-01-22 DIAGNOSIS — G301 Alzheimer's disease with late onset: Secondary | ICD-10-CM | POA: Diagnosis not present

## 2020-01-22 DIAGNOSIS — F09 Unspecified mental disorder due to known physiological condition: Secondary | ICD-10-CM

## 2020-01-22 NOTE — Progress Notes (Signed)
   Psychometrist Note   Cognitive testing was administered to [name] by Milana Kidney, B.S. (Technician) under the supervision of Alphonzo Severance, Psy.D., ABN. [name] was able to tolerate all test procedures. Dr. Nicole Kindred met with the patient as needed to manage any emotional reactions to the testing procedures (if applicable). Rest breaks were offered.    The battery of tests administered was selected by Dr. Nicole Kindred with consideration to the patient's current level of functioning, the nature of her symptoms, emotional and behavioral responses during the interview, level of literacy, observed level of motivation/effort, and the nature of the referral question. This battery was communicated to the psychometrist. Communication between Dr. Nicole Kindred and the psychometrist was ongoing throughout the evaluation and Dr. Nicole Kindred was immediately accessible at all times. Dr. Nicole Kindred provided supervision to the technician on the date of this service, to the extent necessary to assure the quality of all services provided.    [name] will return in approximately one week for an interactive feedback session with Dr. Nicole Kindred, at which time female test performance, clinical impressions, and treatment recommendations will be reviewed in detail. The patient understands she can contact our office should she require our assistance before this time.   A total of 100 minutes of billable time were spent with [name] by the technician, including test administration and scoring time. Billing for these services is reflected in Dr. Les Pou note.   This note reflects time spent with the psychometrician and does not include test scores, clinical history, or any interpretations made by Dr. Nicole Kindred. The full report will follow in a separate note.

## 2020-01-22 NOTE — Telephone Encounter (Signed)
Niece Jennell Corner request [name] order another MRI pt was not able to get the MRI ordered in Dec 2020. Call [facility] [phone].

## 2020-01-22 NOTE — Telephone Encounter (Signed)
Message sent to precert department to follow up on authorization.

## 2020-01-22 NOTE — Progress Notes (Signed)
NEUROPSYCHOLOGICAL EVALUATION [facility]  Patient Name: [name] MRN: [id number] Date of Birth: 1951/02/27 Age: 69 y.o. Education: 13 years  Referral Circumstances and Background Information  [name] is a 69 y.o., right-hand dominant, woman with a history of memory loss since at least 2018. Her issues were noticed in April, when she had an incident where she went off the route she intended to take but then she realized it after about a mile and corrected herself. She was seen in August, 2018 by Dr. Dimas Chyle who was concerned about MCI given very poor memory test performance concerning for a storage issue. I personally reviewed the data from that evaluation and concur with Dr. Philis Fendt impressions. She recently reestablished care with Dr. Everlena Cooper who referred her here for cognitive testing.    On interview, the patient was vague and said that she "sometimes yes and sometimes no" has memory problems. Her niece [name] is here today and was hesitant to talk to me in the patient's presence but stated that she has noticed very significant changes over the past several years and is now quite concerned. The patient says that she takes her medication but she does not, she presents as confused easily, and she has significant memory problems. She thinks the issues are worsening over time. [name] has also noticed some difficulties with word finding. Interestingly, the patient's niece said she has noticed that occasionally the patient slurs when she speaks, although it is intermittent. The patient denied that she is losing things frequently although she appeared to be having a very hard time organizing her personal effects when transferring from the waiting room to my office. The patient stated that with respect to mood, she is doing well and denied feeling sad or lonely, which contrasts somewhat with her report at other medical appointments. She stated that her energy is good. She  reported that she is sleeping well, usually about 7 or 8 hours a night. Her weight is fairly stable.   With respect to movement changes, her niece thinks that she "may" see a little bit of shuffling sometimes. She also stated that she notices the patient having a hard time with certain motor movements such as folding sheets etc. She has had two falls, although those are over the past few years. The patient denied any tremors.   The patient is still driving and stated that she has no issues with that. By contrast, I see information in the medical record that she has gotten lost to some extent before and also couldn't find her dentists office (that was back in 2019-2020). Her niece doesn't drive with her but hasn't noticed any dings on the car. The patient stated that she is still cooking and preparing meals and that goes adequately, the patient's niece told me later in the appointment that is not true. She is starting to neglect her house and even simple things like changing a light bulb are becoming difficult for her. The patient stated that she is managing her own finances and is doing adequately, although her niece thinks that there have been some missed payments on things recently. The patient does not allow her access and so she isn't sure how she is doing but she suspects problems. The patient stated that she is managing her own medications and denied that she forgets them and was able to tell me that she is on Citalopram and B12 shots every few weeks. Her niece thinks that she does not take her  medications as prescribed. The patient stated that she is independent with respect to grocery shopping and using the community. The patient's niece thinks that there have been some changes with judgment and problem solving, and after talking with her further apart from the patient, I think her judgment and problem solving are likely impaired.   Past Medical History and Review of Relevant Studies   Patient Active  Problem List   Diagnosis Date Noted  . Memory problem 06/12/2019  . Altered consciousness 02/17/2017  . Cough 07/28/2016  . Alopecia areata 05/30/2014  . Pneumonia due to organism 05/05/2014  . Tendinopathy of left biceps tendon 10/04/2013  . Trigger finger, acquired 10/04/2013  . Left shoulder pain 09/03/2013  . 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 Onychomycosis of toenail 01/22/2013  . Paronychia of great toe of left foot 01/21/2013  . Abdominal tenderness, LUQ (left upper quadrant) 09/09/2011  . IBS (irritable bowel syndrome) 09/09/2011  . Acute sialoadenitis 02/28/2011  . Well adult exam 02/28/2011  . SKIN RASH 09/03/2010  . ABDOMINAL PAIN, LOWER 02/02/2009  . Essential hypertension 09/26/2008  . ELEVATED BLOOD PRESSURE WITHOUT DIAGNOSIS OF HYPERTENSION 09/12/2008  . CONTACT DERMATITIS&OTHER ECZEMA DUE TO PLANTS 02/13/2008  . B12 deficiency 11/13/2007  . Dyslipidemia 11/13/2007  . LOW BACK PAIN 11/13/2007  . NONSPECIFIC ABNORMAL RESULTS LIVR FUNCTION STUDY 11/13/2007  . Chronic depression 07/14/2007  . OSTEOARTHRITIS 07/14/2007   Review of Neuroimaging and Relevant Studies:  Patient has a CT scan of the head from 2018, the images of which are not available, the report mentions mild volume loss and areas of hypodensity consistent with small vessel ischemia, which were unquantified. An MRI has been ordered now on two occasions but the patient has not gotten that study.   The patient stated that she attempted to schedule that although it sounds like she was confused and talking about her mammogram. I enlisted her niece's help to get that scheduled and they will work on it.   Current Outpatient Medications  Medication Sig Dispense Refill  . acetaminophen (TYLENOL) 500 MG tablet Take 500 mg by mouth every 6 (six) hours as needed.    Marland Kitchen aspirin 81 MG tablet Take 81 mg by mouth daily.      . Cholecalciferol 1000 UNITS capsule Take 1,000 Units by mouth daily.    .  citalopram (CELEXA) 20 MG tablet Take 1 tablet (20 mg total) by mouth daily. 90 tablet 1  . cyanocobalamin (,VITAMIN B-12,) 1000 MCG/ML injection USE AS DIRECTED EVERY 2 WEEKS 6 mL 2  . doxycycline (VIBRAMYCIN) 100 MG capsule Take 1 capsule (100 mg total) by mouth 2 (two) times daily. One po bid x 7 days 14 capsule 0  . loratadine (CLARITIN) 10 MG tablet Take 1 tablet (10 mg total) by mouth daily. (Patient taking differently: Take 10 mg by mouth daily as needed. ) 30 tablet 3   No current facility-administered medications for this visit.   Family History  Problem Relation Age of Onset  . Pneumonia Father   . Diabetes Mother   . Heart disease Mother   . Hypertension Mother   . Pancreatic cancer Mother   . Liver cancer Mother   . Diabetes Other   . Hypertension Other   . Colon cancer Maternal Grandmother 70   There is a family history of dementia, her father developed the condition in his mid sixties to early 72. It sounds like his main symptom was forgetfulness. There is a family history of psychiatric  illness. She stated that she had an uncle with a "mental disorder" who had to go into the hospital but she didn't have details beyond that.   Psychosocial History  Developmental, Educational and Employment History: The patient reported that she did well in school, was never held back, and didn't have any learning difficulties. She went to school for 2 years at a community college, a two years associates degree, and she had to drop out after 18 months. She had an uncle who was mentally ill and she had to help care for him. She worked mainly for [employer] for 35 years and reported that she loved her job. 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 The patient stated that she wishes she didn't retire. She retired in 2018, which she had a hard time remembering.   Psychiatric History: The patient denied any  significant history of depression, although she did have a depressive reaction around the time that her parents died. She wasn't sure when she got on Celexa. Her niece said that she thinks there are some issues with depression still.   Substance Use History: The patient denied any history of alcohol use. The patient doesn't smoke.   Relationship History and Living Cimcumstances: The patient has never been married. She doesn't have any children. The patient is close with her niece [name] and also has a sister in [place]. Her sister has apparently noticed the changes being discussed today.   Mental Status and Behavioral Observations  Sensorium/Arousal: The patient's level of arousal was awake and alert. Hearing and vision were adequate for testing purposes. Orientation: The patient was oriented to person, place, and to some extent situation but she was not well oriented to time (reported it was Tuesday March 6th and that it is the Summer).  Appearance: Dressed in appropriate, casual clothing with adequate grooming and hygiene.  Behavior: The patient presented as somewhat confused at times and as not having insight into her changes Speech/language: Normal in rate, rhythm, and volume. I did not notice any frank speech praxis issues or slurring.  Gait/Posture: Not well observed at today's visit Movement: The patient did seem somewhat hypokinetic and flat (unclear if hypomimia or poker faced) Social Comportment: Appropriate Mood: Good Affect: Blunted  Thought process/content: The patient's thought process was logical and linear for the most part, although much of the information she supplied was incorrect when cross referenced with her nieces report.  Safety: No thoughts of harming self or others identified at today's encounter.  Insight: Impaired, patient with significant anosognosia  MMSE - Mini Mental State Exam 01/22/2020 08/24/2018  Orientation to time 3 5  Orientation to Place 4 5    Registration 3 3  Attention/ Calculation 5 5  Recall 0 0  Language- name 2 objects 2 2  Language- repeat 0 1  Language- follow 3 step command 2 3  Language- read & follow direction 1 1  Write a sentence 1 1  Copy design 1 1  Total score 22 27   MMSE with Serial 7's: 19  Test Procedures  Wide Range Achievement Test - 4             Word Reading Neuropsychological Assessment Battery  List Learning  Story Learning  Daily Living Memory  Naming  Digit Span Repeatable Battery for the Assessment of Neuropsychological Status (Form A)  Figure Copy  Judgment of Line Orientation  Coding  Figure  Recall The Dot Counting Test A Random Letter Test Controlled Oral Word Association (F-A-S) Semantic Fluency (Animals) Trail Making Test A & B Complex Ideational Material Geriatric Depression Scale - Short Form Quick Dementia Rating System (completed by niece, [name])  Plan  LINDI ABRAM was seen for a psychiatric diagnostic evaluation and neuropsychological testing. 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 Her niece is quite concerned. She requested a follow up apart from the patient, which I will arrange pending the patient's test data. I think it is likely that she has lost the capacity to independently make medical decisions and will require a higher level of support but defer definitive determination until her test data is back. She is scoring at a mild to moderate dementia level on the MMSE today, which is a significant decline for her. Full and complete note with impressions, recommendations, and interpretation of test data to follow.   Bettye Boeck Roseanne Reno, PsyD, ABN Clinical Neuropsychologist  Informed Consent and Coding/Compliance  Risks and benefits of the evaluation were discussed with the patient as were the limits of confidentiality. I conducted a clinical  interview and neuropsychological testing (more than two tests) with Christen Bame and Wallace Keller, B.S. (Technician) assisted me in administering additional test procedures. The patient was able to tolerate the testing procedures and the patient (and/or family if applicable) is likely to benefit from further follow up to receive the diagnosis and treatment recommendations, which will be rendered at the next encounter. Billing below reflects technician time, my direct face-to-face time with the patient, time spent in test administration, and time spent in professional activities including but not limited to: neuropsychological test interpretation, integration of neuropsychological test data with clinical history, report preparation, treatment planning, care coordination, and review of diagnostically pertinent medical history or studies.   Services associated with this encounter: Clinical Interview [phone]) plus 60 minutes (60454; Neuropsychological Evaluation by Professional)  180 minutes (09811; Neuropsychological Evaluation by Professional, Adl.) 30 minutes ([id number]; Test Administration by Professional) 30 minutes (29562; Neuropsychological Testing by Technician) 80 minutes (13086; Neuropsychological Testing by Technician, Adl.)

## 2020-01-28 NOTE — Progress Notes (Signed)
NEUROPSYCHOLOGICAL TEST SCORES [facility]  Patient Name: [name] MRN: [id number] Date of Birth: Aug 06, 1951 Age: 69 y.o. Education: 13 years  Measurement properties of test scores: IQ, Index, and Standard Scores (SS): Mean = 100; Standard Deviation = 15 Scaled Scores (Ss): Mean = 10; Standard Deviation = 3 Z scores (Z): Mean = 0; Standard Deviation = 1 T scores (T); Mean = 50; Standard Deviation = 10  TEST SCORES:    Note: This summary of test scores accompanies the interpretive report and should not be considered in isolation without reference to the appropriate sections in the text. Test scores are relative to age, gender, and educational history as available and appropriate.   Performance Validity        A Random Letter Test Raw  Descriptor      Errors 1 WNL  The Dot Counting Test: 22 Below Expectation      Expected Functioning        Wide Range Achievement Test: Standard/Scaled Score Percentile      Word Reading 91 27      Attention/Processing Speed        Neuropsychological Assessment Battery (Attention Module, Form 1): T-score Percentile      Digits Forward 30 2      Digits Backwards 29 2      Repeatable Battery for the Assessment of Neuropsychological Status (Form A): Scaled Score Percentile      Coding 3 1      Language        Neuropsychological Assessment Battery (Language Module, Form 1): T-score Percentile      Naming 56 73      Verbal Fluency: T-score Percentile      Controlled Oral Word Association (F-A-S) 27 1      Semantic Fluency (Animals) 25 1      Memory:        Neuropsychological Assessment Battery (Memory Module, Form 1): T-score Percentile      List Learning           List A Immediate Recall   (3, 4, 4) 25 1         List B Immediate Recall   (2) 35 7         List A Short Delayed Recall   (0) 19 <1         List A Long Delayed Recall   (2) 29 2         List A Long Delayed Yes/No Recognition Hits   (11) -- 54         List A  Long Delayed Yes/No Recognition False Alarms   (18) -- <1         List A Recognition Discriminability Index -- <1      Daily Living Memory            Immediate Recall   (11, 7) 19 <1          Delayed Recall   (0, 0) 19 <1          Recognition Hits -- <1      Repeatable Battery for the Assessment of Neuropsychological Status (Form A): Scaled Score Percentile         Figure Recall   (6) 5 5      Visuospatial/Constructional Functioning        Repeatable Battery for the Assessment of Neuropsychological Status (Form A): Standard/Scaled Score Percentile     Visuospatial/Constructional Index 66 1  Figure Copy 6 9         Judgment of Line Orientation --- <2      Executive Functioning        [employer] Test (MWCST): Standard/T-Score Percentile  Discontinued        Trail Making Test: T-Score Percentile      Part A 24 <1      Part B 13 <1      [place] Diagnostic Aphasia Exam: Raw Score Scaled Score      Complex Ideational Material 11 9      Clock Drawing Raw Score Descriptor      Command 3 Severe Impairment      Rating Scales        Quick Dementia Rating System Raw Score Descriptor      Sum of Boxes 5.5 Mild Dementia      Total Score 9 Mild Dementia  Geriatric Depression Scale - Short Form 0 WNL   Peter V. Nicole Kindred PsyD, [place] Clinical Neuropsychologist

## 2020-01-29 ENCOUNTER — Encounter: Payer: Self-pay | Admitting: Counselor

## 2020-01-29 ENCOUNTER — Ambulatory Visit (INDEPENDENT_AMBULATORY_CARE_PROVIDER_SITE_OTHER): Payer: Medicare PPO | Admitting: Counselor

## 2020-01-29 ENCOUNTER — Other Ambulatory Visit: Payer: Self-pay

## 2020-01-29 DIAGNOSIS — F0281 Dementia in other diseases classified elsewhere with behavioral disturbance: Secondary | ICD-10-CM | POA: Diagnosis not present

## 2020-01-29 DIAGNOSIS — G301 Alzheimer's disease with late onset: Secondary | ICD-10-CM

## 2020-01-29 NOTE — Progress Notes (Signed)
[facility]  Patient Name: [name] MRN: [id number] Date of Birth: 1951/01/21 Age: 69 y.o. Education: 70 years  Clinical Impressions  [name] is a 69 y.o., right-hand dominant, woman with a history of Mild Cognitive Impairment as per previous neuropsychological testing with Dr. Bonita [name] in 2018. Since that time, it sounds as though she is having increased difficulties. Her niece [name] who accompanied her to the appointment was hesitant to talk in front of the patient but stated that she presents as confused frequently, has stopped taking good care of her house, cannot even do simple things such as changing a light bulb without difficulties, and she is concerned about her finances. The patient is intermittently cooperative with caregiving and at times will ask [name] to leave when she comes to do things like cut the grass. She doesn't allow her access to her finances so they aren't sure how that is going. The patient presented as having significant anosognosia and not appreciating the significant problems that were raised.   There is evidence from neuropsychological testing of a significant decline in several areas, including on measures of attention/working memory, on measures of verbal fluency, with visuospatial functioning and executive functioning. Interestingly, the patient was able to remember some minimal amount of information across time and her memory does not appear significantly different from the last assessment although it is still certainly impaired as compared to her presumed baseline. She screened negative for symptoms associated with depression and was characterized as functioning at a mild dementia level by her niece on severity status staging. I was able to formally rate a CDR for her and her Global Score is 1, her Sum of Boxes Score is 8.5, which is late mild dementia.   [name] is thus demonstrating a significant level  of cognitive decline, amounting to a late mild dementia level of dysfunction. Alzheimer's presents as the most likely etiology in lieu of her performance and clinical history. I am concerned that she has lost the ability to independently make medical decisions and am also concerned about her ability to self-manage medications and finances and will suggest that her niece provide assistance. Guardianship may be appropriate if caregiving is met with resistance.   Diagnostic Impressions: Alzheimer's dementia with behavioral disturbance  Recommendations to be discussed with patient  Your performance and presentation today were consistent with decrements in several areas and memory storage/consolidation problems, as revealed at your last testing with Dr. Si [name]. I do think that the degree of impairment in conjunction with your clinical history is sufficient for a frank diagnosis of dementia.   Dementia refers to a group of syndromes where multiple areas of ability are damaged in the brain, such as memory, thinking, judgment, and behavior, and most commonly refer to age related causes of dementia that cause worsening in these abilities over time. Alzheimer's disease is the most common form of dementia in people over the age of 69. Not all dementias are Alzheimer's disease, but all Alzheimer's disease is dementia. When dementia is due to an underlying condition affecting the brain, such as Alzheimer's disease, there is progression over time, which typically procedes gradually over many years.   In your case, I think that your dementia is likely due to Alzheimer's disease given your clinical history of early memory impairment and pattern of impaired test performance. I would stage your severity at a late mild level of progression, considering all available information.   Some individuals with mild dementia are able  to make decisions and some are not. I am concerned given your reported difficulties and level of  cognitive impairment that you have lost the ability to independently make medical and financial decisions of high risk. I would therefore encourage you to accept help from your niece or other trusted family members/friends with any memory dependent or cognitively challenging tasks. This includes finances, medications, and attending doctors appointments.   If you are unwilling to accept help from your family to safeguard your interests, then I would suggest that they pursue guardianship, which is a formal legal arrangement allowing them to help make decisions in your best interest. It is important to know that the intent of guardianship is to protect your best interests and make sure your needs are met so that you can be as independent as possible.  I would recommend that you consult with an elder care attorney regarding advanced directives if you have not, such as a healthcare power of attorney and living will. Consultation with an elder care attorney can help provide guidance about ways to protect your estate and appropriate legal arrangements to make sure decisions are made in your best interest. I can provide my strong recommendation for the [employer], who are located at [address] in [place], [place]. They can be reached at [phone]. They also have a website [email or web address].   I defer to Dr. Tomi Likens regarding your driving safety but would note that your degree of severity is getting to a point where it is time to consider refraining from driving.   A trial of a first-line anti dementia agent such as Aricept or another medication could be considered if deemed medically appropriate.   Often times, individuals with dementia are the last to be aware of their problems. The medical term for this lack of insight is called "anosognosia," which means absence of knowledge of illness. While their failure to recognize what seems obvious to everyone else can be frustrating, it is not  their fault and is part of the disease itself. Trying to point out problems to the unaware individual is usually not helpful and is frequently harmful. What is helpful is to provide reassurance, redirect them, and avoid confronting them directly with the cognitive difficulties they are experiencing.  It is particularly important for individuals with anosognosia to get help planning for medical and other needs because they often are unaware of what their needs are.   We can discuss strategies for maintaining cognitive longevity at follow up, including diet and exercise. Establishing an adaptive routine with clearly delineated instructions for activities that must be done on a daily basis (e.g., eating, household chores, etc.) is recommended   Test Findings  Test scores are summarized in additional documentation associated with this encounter. Test scores are relative to age, gender, and educational history as available and appropriate. There were no concerns about performance validity despite one indicator below expectations. This likely reflects the extent of the patient's impairment.   General Intellectual Functioning/Achievement:  Performance on single word reading fell at an average level, which in conjunction with the patient's previous test data presents as a reasonable standard of comparison.   Attention and Processing Efficiency: Performance on indicators of attention and working memory was significantly lower than at her previous assessment with Dr. Si [name] in 2018, falling at an unusually to extremely low level.   Timed indicators of processing reflected impaired performance with extremely low scores on timed numeric sequencing and number-symbol coding, both  scores are weaker than at her previous appointment with Dr. Si [name].   Language: Language findings were mixed with intact, normal range visual object confrontation naming and then extremely low scores on sensitive fluency indicators. Her  performance on fluency measures was lower than at the appointment with Dr. Si [name].   Visuospatial Function: Performance on visuospatial and constructional measures was extremely low overall. Figure copy was unusually low. She had a hard time understanding the instructions for judgment of line orientation, and her extremely low score on that task may reflect general confusion as opposed to circumscribed visuospatial difficulties per se.   Learning and Memory: Performance on measures of learning and memory was low, with a profile concerning for storage problems.   In the verbal realm, immediate recall of a 12-item word list was 3, 4, and 4 words across three trials, which is extremely low. She did not recall any words on short delayed recall, but then did recall 2 words on long delayed recall, which is nevertheless a demonstration of impairment. Yes/no recognition for words from the list was low with more false positives than correct identifications. Memory for brief daily-living type information was similar with extremely low scores for immediate and delayed recall. Her recognition was no better.   In the visual realm, delayed recall for a modestly complex figure was unusually low although she did retain some of the information.   Executive Functions: Performance on executive indicators was low, with suggestion of decline since her assessment with Dr. Si [name] in 2018. Alternating sequencing of numbers and letters was extremely low. Generation of words in response to the letters F-A-S was extremely low. Performance on clock drawing was "severely impaired" with spatial distortion of the numbers, missing numbers, and no representation of the hands. Her performance did not improve appreciably when she was given another attempt. She did better when answering a series of yes/no questions on a measure involving reasoning with basic factual information through answering a series of yes/no questions.   Rating  Scale(s): The patient was characterized as functioning at a mild dementia level by her niece. I was able to formally rate a CDR for her and her global score is 1, her Sum of Boxes score is 8.5, which is late mild dementia. She screened negative for the presence of depression.   Viviano Simas Nicole Kindred PsyD, [place] Clinical Neuropsychologist

## 2020-01-29 NOTE — Progress Notes (Signed)
[facility]  I met with [name] to review the findings resulting from her neuropsychological evaluation. Unfortunately, she did not remember the appointment. She did not arrive at her nieces house despite a reminder and I attempted to contact her at home and got no answer. As a result, I conducted the feedback session with [name]. Time was spent reviewing the impressions and recommendations that are detailed in the evaluation report. I had a long conversation with [name] [name] that at this point, she has late mild level dementia, and at a minimum needs help with finances, medications, and probably also nutritious meal preparation. We discussed safe living arrangements for people at this level of functioning, which include home with significant supervision and assistance, assisted living, or even skilled nursing care. I discussed ways of having the necessary conversation with the patient and referred them to the [facility] for legal guidance if needed. I shared my opinion that guardianship would be appropriate, in this case, if the patient resists caregiving attempts. Interventions provided during this encounter included psychoeducation and supportive counseling. I took time to explain the findings and answer all the family's questions.   Current Medications and Medical History   Current Outpatient Medications  Medication Sig Dispense Refill  . acetaminophen (TYLENOL) 500 MG tablet Take 500 mg by mouth every 6 (six) hours as needed.    Marland Kitchen aspirin 81 MG tablet Take 81 mg by mouth daily.      . Cholecalciferol 1000 UNITS capsule Take 1,000 Units by mouth daily.    . citalopram (CELEXA) 20 MG tablet Take 1 tablet (20 mg total) by mouth daily. 90 tablet 1  . cyanocobalamin (,VITAMIN B-12,) 1000 MCG/ML injection USE AS DIRECTED EVERY 2 WEEKS 6 mL 2  . doxycycline (VIBRAMYCIN) 100 MG capsule Take 1 capsule (100 mg total) by mouth 2 (two)  times daily. One po bid x 7 days 14 capsule 0  . loratadine (CLARITIN) 10 MG tablet Take 1 tablet (10 mg total) by mouth daily. (Patient taking differently: Take 10 mg by mouth daily as needed. ) 30 tablet 3   No current facility-administered medications for this visit.    Patient Active Problem List   Diagnosis Date Noted  . Memory problem 06/12/2019  . Altered consciousness 02/17/2017  . Cough 07/28/2016  . Alopecia areata 05/30/2014  . Pneumonia due to organism 05/05/2014  . Tendinopathy of left biceps tendon 10/04/2013  . Trigger finger, acquired 10/04/2013  . Left shoulder pain 09/03/2013  . 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 Onychomycosis of toenail 01/22/2013  . Paronychia of great toe of left foot 01/21/2013  . Abdominal tenderness, LUQ (left upper quadrant) 09/09/2011  . IBS (irritable bowel syndrome) 09/09/2011  . Acute sialoadenitis 02/28/2011  . Well adult exam 02/28/2011  . SKIN RASH 09/03/2010  . ABDOMINAL PAIN, LOWER 02/02/2009  . Essential hypertension 09/26/2008  . ELEVATED BLOOD PRESSURE WITHOUT DIAGNOSIS OF HYPERTENSION 09/12/2008  . CONTACT DERMATITIS&OTHER ECZEMA DUE TO PLANTS 02/13/2008  . B12 deficiency 11/13/2007  . Dyslipidemia 11/13/2007  . LOW BACK PAIN 11/13/2007  . NONSPECIFIC ABNORMAL RESULTS LIVR FUNCTION STUDY 11/13/2007  . Chronic depression 07/14/2007  . OSTEOARTHRITIS 07/14/2007    Mental Status and Behavioral Observations  Unable to complete because follow up was with the patient's family.   Plan  Feedback provided to the family regarding this patient's neuropsychological evaluation. As above, I believe she needs more support and suggested that her family  provide it and we discussed ways to do so in a manner most likely to elicit the patient's compliance.   Allison Simas Nicole Kindred, PsyD, ABN Clinical Neuropsychologist  Service(s) Provided at This Encounter: 50 minutes [phone]; Family therapy without patient present)

## 2020-02-17 NOTE — Progress Notes (Signed)
NEUROLOGY FOLLOW UP OFFICE NOTE  [name] [id number]  HISTORY OF PRESENT ILLNESS: [name] is a 70 year oldright-handed female with hypertension, dyslipidemia, depression and IBS who follows up for memory changes.  She is accompanied by her daughter who supplements history.  UPDATE: She had a repeat neuropsychological evaluation conducted by Dr. Roseanne Reno on 01/22/2020.  Testing showed significant decline from previous evaluation in 2018 with findings consistent with late mild Alzheimer's dementia with behavioral disturbance.   Marland Kitchen  HISTORY: She reports that she sustained a concussion in 2013 when she tripped and fell, hitting her head on the sidewalk. She hit the right frontal region, sustaining a black eye. She felt slightly dizzy but she did not lose consciousness, feel confused, felt nauseous or vomited, noted double vision or have a headache. At the time, she stayed out of work and rested for about a week. Since then, she has noted some mild short-term memory issues, really only limited to frequently misplacing objects, which she is able to quickly retrieve. In April 2018, she wasdriving home from her sister's house. She began to drive off route and didn't realize it until about a mile later, at which point she turned around and drove home. She did not lose awareness and was not disoriented. She just started driving in a different direction while on a familiar route. There was no associated headache, nausea, dizziness, visual disturbance or unilateral numbness or weakness. Her sister had been suffering from sciatica and so she was driving back and forth to her sister's place in order to help out. She had been exhausted and had even taken a nap prior to driving home.  She had a second fall in September 2018, in which she hit her head.  CT head performed at that time.  She underwent neuropsychological testing on 05/18/2017 by Dr. Alinda Dooms which demonstrated amnestic mild  cognitive impaiirment.  At that time, I ordered an MRI of brain, which she did not have performed and did not follow up with me.  Since 2020, she has had increased memory difficulties. She frequently forgets conversations quickly.  She forgets where some familiar places are, such as the dentist.  However, it had been a year since she last saw the dentist.  Otherwise, no disorientation on familiar routes.  She remains independent.  She lives by herself.  She pays her own bills.  She has forgotten to pay 2 bills but otherwise no issues.  She reports that she took herself off of her citalopram several months ago and just recently restarted it.  06/12/2019 LABS:  B12 550; TSH 0.77  PAST MEDICAL HISTORY: Past Medical History:  Diagnosis Date  . Depression   . Hyperlipidemia   . LBP (low back pain)   . OA (osteoarthritis)   . Vitamin B 12 deficiency     MEDICATIONS: Current Outpatient Medications on File Prior to Visit  Medication Sig Dispense Refill  . acetaminophen (TYLENOL) 500 MG tablet Take 500 mg by mouth every 6 (six) hours as needed.    Marland Kitchen aspirin 81 MG tablet Take 81 mg by mouth daily.      . Cholecalciferol 1000 UNITS capsule Take 1,000 Units by mouth daily.    . citalopram (CELEXA) 20 MG tablet Take 1 tablet (20 mg total) by mouth daily. 90 tablet 1  . cyanocobalamin (,VITAMIN B-12,) 1000 MCG/ML injection USE AS DIRECTED EVERY 2 WEEKS 6 mL 2  . doxycycline (VIBRAMYCIN) 100 MG capsule Take 1 capsule (100 mg  total) by mouth 2 (two) times daily. One po bid x 7 days 14 capsule 0  . loratadine (CLARITIN) 10 MG tablet Take 1 tablet (10 mg total) by mouth daily. (Patient taking differently: Take 10 mg by mouth daily as needed. ) 30 tablet 3   No current facility-administered medications on file prior to visit.    ALLERGIES: Allergies  Allergen Reactions  . Erythromycin Base Nausea Only  . Other Nausea And Vomiting and Other (See Comments)    Mycins-dizziness    FAMILY HISTORY: Family  History  Problem Relation Age of Onset  . Pneumonia Father   . Diabetes Mother   . Heart disease Mother   . Hypertension Mother   . Pancreatic cancer Mother   . Liver cancer Mother   . Diabetes Other   . Hypertension Other   . Colon cancer Maternal Grandmother 25   SOCIAL HISTORY: Social History   Socioeconomic History  . Marital status: Single    Spouse name: Not on file  . Number of children: 0  . Years of education: 52  . Highest education level: Not on file  Occupational History  . Occupation: retired  Tobacco Use  . Smoking status: Never Smoker  . Smokeless tobacco: Never Used  Substance and Sexual Activity  . Alcohol use: No  . Drug use: No  . Sexual activity: Not Currently  Other Topics Concern  . Not on file  Social History Narrative   Lives alone in a 1 1/2 story home.  No children.  Retired from [employer].  Education: high school.     Social Determinants of Health   Financial Resource Strain:   . Difficulty of Paying Living Expenses:   Food Insecurity:   . Worried About Charity fundraiser in the Last Year:   . Arboriculturist in the Last Year:   Transportation Needs:   . Film/video editor (Medical):   Marland Kitchen Lack of Transportation (Non-Medical):   Physical Activity: Inactive  . Days of Exercise per Week: 0 days  . Minutes of Exercise per Session: 0 min  Stress: No Stress Concern Present  . Feeling of Stress : Only a little  Social Connections:   . Frequency of Communication with Friends and Family:   . Frequency of Social Gatherings with Friends and Family:   . Attends Religious Services:   . Active Member of Clubs or Organizations:   . Attends Archivist Meetings:   Marland Kitchen Marital Status:   Intimate Partner Violence: Unknown  . Fear of Current or Ex-Partner: Not asked  . Emotionally Abused: Not asked  . Physically Abused: Not asked  . Sexually Abused: Not asked    PHYSICAL EXAM: Blood pressure 121/61, pulse 72, resp. rate 18, height  5\' 2"  (1.575 m), weight 152 lb (68.9 kg), SpO2 96 %.  General: No acute distress.  Patient appears well-groomed.   Head:  Normocephalic/atraumatic  IMPRESSION: Major neurocognitive disorder, Alzheimer's dementia  PLAN: 1.  Will start donepezil 5mg  at bedtime, increase to 10mg  at bedtime in 4 weeks.  Once she has been on 10mg  at bedtime for 4 weeks, will start memantine. 2.  Refrain from driving.  If they wish to pursue, will provide contact for occupational driving safety evaluation. 3.  Family should monitor bills and medication management. 4.  Provided information on resources 5.  Follow up in 6 months.  Total time spent with patient:  15 minutes.  Metta Clines, DO  CC: Aleksei Plotnikov,  MD

## 2020-02-18 ENCOUNTER — Ambulatory Visit (INDEPENDENT_AMBULATORY_CARE_PROVIDER_SITE_OTHER): Payer: Medicare PPO | Admitting: Neurology

## 2020-02-18 ENCOUNTER — Other Ambulatory Visit: Payer: Self-pay

## 2020-02-18 ENCOUNTER — Encounter: Payer: Self-pay | Admitting: Neurology

## 2020-02-18 VITALS — BP 121/61 | HR 72 | Resp 18 | Ht 62.0 in | Wt 152.0 lb

## 2020-02-18 DIAGNOSIS — G301 Alzheimer's disease with late onset: Secondary | ICD-10-CM

## 2020-02-18 DIAGNOSIS — F02818 Dementia in other diseases classified elsewhere, unspecified severity, with other behavioral disturbance: Secondary | ICD-10-CM

## 2020-02-18 DIAGNOSIS — F0281 Dementia in other diseases classified elsewhere with behavioral disturbance: Secondary | ICD-10-CM | POA: Diagnosis not present

## 2020-02-18 MED ORDER — DONEPEZIL HCL 5 MG PO TABS: 5.0000 mg | ORAL_TABLET | Freq: Every day | ORAL | 0 refills | Status: DC

## 2020-02-18 NOTE — Patient Instructions (Signed)
1.  We will start donepezil (Aricept) 5mg  daily for four weeks.  If you are tolerating the medication, then after four weeks, we will increase the dose to 10mg  daily.  Side effects include nausea, vomiting, diarrhea, vivid dreams, and muscle cramps.  Please call the clinic if you experience any of these symptoms. 2.  After you are on donepezil 10mg  at bedtime for 4 weeks, contact me and we will add a second memory medication. 3.  I would refrain from driving until you have a formal driving evaluation.  Will provide you contact 4.  Please have family go over bills and medications 5.  Please review resources 6.  Follow up in 6 months

## 2020-03-11 ENCOUNTER — Other Ambulatory Visit: Payer: Self-pay | Admitting: Neurology

## 2020-04-08 ENCOUNTER — Other Ambulatory Visit: Payer: Self-pay | Admitting: Neurology

## 2020-04-12 ENCOUNTER — Emergency Department (HOSPITAL_COMMUNITY)
Admission: EM | Admit: 2020-04-12 | Discharge: 2020-04-12 | Disposition: A | Discharge status: Home / Self Care | Payer: Medicare PPO | Attending: Emergency Medicine | Admitting: Emergency Medicine

## 2020-04-12 ENCOUNTER — Encounter (HOSPITAL_BASED_OUTPATIENT_CLINIC_OR_DEPARTMENT_OTHER): Payer: Self-pay

## 2020-04-12 ENCOUNTER — Other Ambulatory Visit: Payer: Self-pay

## 2020-04-12 ENCOUNTER — Emergency Department (HOSPITAL_BASED_OUTPATIENT_CLINIC_OR_DEPARTMENT_OTHER)
Admission: EM | Admit: 2020-04-12 | Discharge: 2020-04-13 | Disposition: A | Discharge status: Home / Self Care | Payer: Medicare PPO | Attending: Emergency Medicine | Admitting: Emergency Medicine

## 2020-04-12 ENCOUNTER — Emergency Department (HOSPITAL_BASED_OUTPATIENT_CLINIC_OR_DEPARTMENT_OTHER): Payer: Medicare PPO

## 2020-04-12 DIAGNOSIS — R4182 Altered mental status, unspecified: Secondary | ICD-10-CM | POA: Diagnosis not present

## 2020-04-12 DIAGNOSIS — R443 Hallucinations, unspecified: Secondary | ICD-10-CM | POA: Diagnosis not present

## 2020-04-12 DIAGNOSIS — Z7982 Long term (current) use of aspirin: Secondary | ICD-10-CM | POA: Insufficient documentation

## 2020-04-12 DIAGNOSIS — Z5321 Procedure and treatment not carried out due to patient leaving prior to being seen by health care provider: Secondary | ICD-10-CM | POA: Insufficient documentation

## 2020-04-12 DIAGNOSIS — R21 Rash and other nonspecific skin eruption: Secondary | ICD-10-CM | POA: Insufficient documentation

## 2020-04-12 DIAGNOSIS — F0390 Unspecified dementia without behavioral disturbance: Secondary | ICD-10-CM | POA: Insufficient documentation

## 2020-04-12 DIAGNOSIS — B9689 Other specified bacterial agents as the cause of diseases classified elsewhere: Secondary | ICD-10-CM | POA: Diagnosis not present

## 2020-04-12 DIAGNOSIS — N3000 Acute cystitis without hematuria: Secondary | ICD-10-CM

## 2020-04-12 DIAGNOSIS — I10 Essential (primary) hypertension: Secondary | ICD-10-CM | POA: Diagnosis not present

## 2020-04-12 DIAGNOSIS — R410 Disorientation, unspecified: Secondary | ICD-10-CM | POA: Diagnosis not present

## 2020-04-12 DIAGNOSIS — R0989 Other specified symptoms and signs involving the circulatory and respiratory systems: Secondary | ICD-10-CM | POA: Insufficient documentation

## 2020-04-12 HISTORY — DX: Dementia in other diseases classified elsewhere, unspecified severity, without behavioral disturbance, psychotic disturbance, mood disturbance, and anxiety: F02.80

## 2020-04-12 LAB — CBC
HCT: 39.5 % (ref 36.0–46.0)
Hemoglobin: 13.1 g/dL (ref 12.0–15.0)
MCH: 31.6 pg (ref 26.0–34.0)
MCHC: 33.2 g/dL (ref 30.0–36.0)
MCV: 95.4 fL (ref 80.0–100.0)
Platelets: 281 10*3/uL (ref 150–400)
RBC: 4.14 MIL/uL (ref 3.87–5.11)
RDW: 12.3 % (ref 11.5–15.5)
WBC: 11.2 10*3/uL — ABNORMAL HIGH (ref 4.0–10.5)
nRBC: 0 % (ref 0.0–0.2)

## 2020-04-12 LAB — COMPREHENSIVE METABOLIC PANEL
ALT: 24 U/L (ref 0–44)
AST: 49 U/L — ABNORMAL HIGH (ref 15–41)
Albumin: 3.7 g/dL (ref 3.5–5.0)
Alkaline Phosphatase: 67 U/L (ref 38–126)
Anion gap: 10 (ref 5–15)
BUN: 19 mg/dL (ref 8–23)
CO2: 24 mmol/L (ref 22–32)
Calcium: 8.8 mg/dL — ABNORMAL LOW (ref 8.9–10.3)
Chloride: 104 mmol/L (ref 98–111)
Creatinine, Ser: 0.91 mg/dL (ref 0.44–1.00)
GFR calc Af Amer: >60 mL/min (ref >60)
GFR calc non Af Amer: >60 mL/min (ref >60)
Glucose, Bld: 122 mg/dL — ABNORMAL HIGH (ref 70–99)
Potassium: 3.5 mmol/L (ref 3.5–5.1)
Sodium: 138 mmol/L (ref 135–145)
Total Bilirubin: 1.4 mg/dL — ABNORMAL HIGH (ref 0.3–1.2)
Total Protein: 7.7 g/dL (ref 6.5–8.1)

## 2020-04-12 LAB — URINALYSIS, MICROSCOPIC (REFLEX)

## 2020-04-12 LAB — URINALYSIS, ROUTINE W REFLEX MICROSCOPIC
Bilirubin Urine: NEGATIVE
Glucose, UA: NEGATIVE mg/dL
Ketones, ur: NEGATIVE mg/dL
Nitrite: NEGATIVE
Protein, ur: NEGATIVE mg/dL
Specific Gravity, Urine: >1.03 — ABNORMAL HIGH (ref 1.005–1.030)
pH: 5.5 (ref 5.0–8.0)

## 2020-04-12 LAB — CBG MONITORING, ED: Glucose-Capillary: 115 mg/dL — ABNORMAL HIGH (ref 70–99)

## 2020-04-12 MED ORDER — SODIUM CHLORIDE 0.9 % IV BOLUS
500.0000 mL | Freq: Once | INTRAVENOUS | Status: AC
  Administered 2020-04-13: 500 mL via INTRAVENOUS

## 2020-04-12 MED ORDER — SODIUM CHLORIDE 0.9% FLUSH
3.0000 mL | Freq: Once | INTRAVENOUS | Status: DC
  Filled 2020-04-12: qty 3

## 2020-04-12 NOTE — ED Notes (Signed)
Pt's niece states that she is taking the [facility] to [facility] because its closer to her house.

## 2020-04-12 NOTE — ED Triage Notes (Addendum)
Pt brought to the ED by her niece. Per the niece pt is confused and disoriented. Pt has a hx of Alzheimer's, but pt has had an acute change to mental status. Family found the pt outside at 1930 this evening and pt was locked out of the house. Pt also has red rash around her ankles. No other focal neuro symptoms noted in triage.   Pt's last known normal was last PM at 1900 when a family member talked to her on the phone.

## 2020-04-13 MED ORDER — CEPHALEXIN 500 MG PO CAPS
500.0000 mg | ORAL_CAPSULE | Freq: Three times a day (TID) | ORAL | 0 refills | Status: DC
Start: 2020-04-13 — End: 2020-11-17

## 2020-04-13 NOTE — ED Provider Notes (Signed)
[facility] EMERGENCY DEPARTMENT Provider Note   CSN: [id number] Arrival date & time: 04/12/20  2209     History Chief Complaint  Patient presents with  . Altered Mental Status    [name] is a 69 y.o. female.  HPI     This is a 69 year old female with a history of Alzheimer's dementia, hyperlipidemia who presents with confusion.  Patient was brought in by her granddaughter.  Granddaughter reports that she last talked to her grandmother yesterday afternoon.  She attempted to call her on Sunday at noon and after multiple attempts was unable to get up with her.  Ultimately, she and her mother went to the house and found her wandering outside.  She had locked herself out.  She did not know why she was outside or how long she had been outside.  Patient lives independently currently but has never had any issue with wandering.  She lives by herself.  Granddaughter reports that the patient appeared to be hallucinating and was talking about a giraffe in the backyard and a snake when they came in the house.  She is not noted any focal deficits.  No recent illnesses.  The Alzheimer's diagnosis is fairly new and she takes Aricept.  Patient is without complaint currently.  She denies chest pain, shortness breath, abdominal pain, nausea, vomiting.  She does have a rash on the left ankle that the granddaughter noted.  Patient appears insightful.  She states she does not recall why she was outside and does endorse seeing things.  Past Medical History:  Diagnosis Date  . Alzheimer disease ([place])   . Depression   . Hyperlipidemia   . LBP (low back pain)   . OA (osteoarthritis)   . Vitamin B 12 deficiency     Patient Active Problem List   Diagnosis Date Noted  . Memory problem 06/12/2019  . Altered consciousness 02/17/2017  . Cough 07/28/2016  . Alopecia areata 05/30/2014  . Pneumonia due to organism 05/05/2014  . Tendinopathy of left biceps tendon 10/04/2013  . Trigger finger,  acquired 10/04/2013  . Left shoulder pain 09/03/2013  . 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 Onychomycosis of toenail 01/22/2013  . Paronychia of great toe of left foot 01/21/2013  . Abdominal tenderness, LUQ (left upper quadrant) 09/09/2011  . IBS (irritable bowel syndrome) 09/09/2011  . Acute sialoadenitis 02/28/2011  . Well adult exam 02/28/2011  . SKIN RASH 09/03/2010  . ABDOMINAL PAIN, LOWER 02/02/2009  . Essential hypertension 09/26/2008  . ELEVATED BLOOD PRESSURE WITHOUT DIAGNOSIS OF HYPERTENSION 09/12/2008  . CONTACT DERMATITIS&OTHER ECZEMA DUE TO PLANTS 02/13/2008  . B12 deficiency 11/13/2007  . Dyslipidemia 11/13/2007  . LOW BACK PAIN 11/13/2007  . NONSPECIFIC ABNORMAL RESULTS LIVR FUNCTION STUDY 11/13/2007  . Chronic depression 07/14/2007  . OSTEOARTHRITIS 07/14/2007    Past Surgical History:  Procedure Laterality Date  . CHOLECYSTECTOMY  1990     OB History   No obstetric history on file.     Family History  Problem Relation Age of Onset  . Pneumonia Father   . Diabetes Mother   . Heart disease Mother   . Hypertension Mother   . Pancreatic cancer Mother   . Liver cancer Mother   . Diabetes Other   . Hypertension Other   . Colon cancer Maternal Grandmother 44    Social History   Tobacco Use  . Smoking status: Never Smoker  . Smokeless tobacco: Never Used  Vaping Use  . Vaping Use:  Never used  Substance Use Topics  . Alcohol use: No  . Drug use: No    Home Medications Prior to Admission medications   Medication Sig Start Date End Date Taking? Authorizing Provider  acetaminophen (TYLENOL) 500 MG tablet Take 500 mg by mouth every 6 (six) hours as needed.    [provider]  aspirin 81 MG tablet Take 81 mg by mouth daily.      [provider]  cephALEXin (KEFLEX) 500 MG capsule Take 1 capsule (500 mg total) by mouth 3 (three) times daily. 04/13/20   Gaines Cartmell, Mayer Masker, MD  Cholecalciferol 1000 UNITS capsule Take 1,000  Units by mouth daily.    [provider]  citalopram (CELEXA) 20 MG tablet Take 1 tablet (20 mg total) by mouth daily. 11/12/19   Plotnikov, Georgina Quint, MD  cyanocobalamin (,VITAMIN B-12,) 1000 MCG/ML injection USE AS DIRECTED EVERY 2 WEEKS 10/29/19   Plotnikov, Georgina Quint, MD  donepezil (ARICEPT) 5 MG tablet TAKE 1 TABLET BY MOUTH EVERYDAY AT BEDTIME 04/09/20   Everlena Cooper, Adam R, DO  doxycycline (VIBRAMYCIN) 100 MG capsule Take 1 capsule (100 mg total) by mouth 2 (two) times daily. One po bid x 7 days Patient not taking: Reported on 02/18/2020 01/04/20   Geoffery Lyons, MD  loratadine (CLARITIN) 10 MG tablet Take 1 tablet (10 mg total) by mouth daily. Patient taking differently: Take 10 mg by mouth daily as needed.  07/28/16 07/28/17  Plotnikov, Georgina Quint, MD    Allergies    Erythromycin base and Other  Review of Systems   Review of Systems  Constitutional: Negative for fever.  Respiratory: Negative for shortness of breath.   Cardiovascular: Negative for chest pain.  Gastrointestinal: Negative for abdominal pain, nausea and vomiting.  Skin: Positive for rash.  Psychiatric/Behavioral: Positive for confusion and hallucinations.  All other systems reviewed and are negative.   Physical Exam Updated Vital Signs BP 108/61 (BP Location: Right Arm)   Pulse 65   Temp 98.2 F (36.8 C) (Oral)   Resp 19   Ht 1.6 m (5\' 3" )   Wt 77.1 kg   SpO2 100%   BMI 30.11 kg/m   Physical Exam Vitals and nursing note reviewed.  Constitutional:      Appearance: She is well-developed. She is not ill-appearing.  HENT:     Head: Normocephalic and atraumatic.     Nose: Nose normal.     Mouth/Throat:     Mouth: Mucous membranes are moist.  Eyes:     Pupils: Pupils are equal, round, and reactive to light.  Cardiovascular:     Rate and Rhythm: Normal rate and regular rhythm.     Heart sounds: Normal heart sounds.  Pulmonary:     Effort: Pulmonary effort is normal. No respiratory distress.     Breath  sounds: No wheezing.  Abdominal:     General: Bowel sounds are normal.     Palpations: Abdomen is soft.     Tenderness: There is no abdominal tenderness.  Musculoskeletal:        General: No swelling or deformity.     Cervical back: Neck supple.  Skin:    General: Skin is warm and dry.     Comments: Petechial rash noted just above the sock line, nonblanching  Neurological:     Mental Status: She is alert and oriented to person, place, and time.     Comments: Patient is awake, alert, oriented x3, cranial nerves II through XII intact, 5  out of 5 strength in all 4 extremities, patient can name and repeat, at times appears slow to answer questions  Psychiatric:        Mood and Affect: Mood normal.     ED Results / Procedures / Treatments   Labs (all labs ordered are listed, but only abnormal results are displayed) Labs Reviewed  COMPREHENSIVE METABOLIC PANEL - Abnormal; Notable for the following components:      Result Value   Glucose, Bld 122 (*)    Calcium 8.8 (*)    AST 49 (*)    Total Bilirubin 1.4 (*)    All other components within normal limits  CBC - Abnormal; Notable for the following components:   WBC 11.2 (*)    All other components within normal limits  URINALYSIS, ROUTINE W REFLEX MICROSCOPIC - Abnormal; Notable for the following components:   APPearance HAZY (*)    Specific Gravity, Urine >1.030 (*)    Hgb urine dipstick TRACE (*)    Leukocytes,Ua SMALL (*)    All other components within normal limits  URINALYSIS, MICROSCOPIC (REFLEX) - Abnormal; Notable for the following components:   Bacteria, UA MANY (*)    All other components within normal limits  CBG MONITORING, ED - Abnormal; Notable for the following components:   Glucose-Capillary 115 (*)    All other components within normal limits  URINE CULTURE    EKG None  Radiology CT Head Wo Contrast  Result Date: 04/12/2020 CLINICAL DATA:  Altered mental status EXAM: CT HEAD WITHOUT CONTRAST TECHNIQUE:  Contiguous axial images were obtained from the base of the skull through the vertex without intravenous contrast. COMPARISON:  July 13, 2017 FINDINGS: Brain: No evidence of acute territorial infarction, hemorrhage, hydrocephalus,extra-axial collection or mass lesion/mass effect. There is dilatation the ventricles and sulci consistent with age-related atrophy. Low-attenuation changes in the deep white matter consistent with small vessel ischemia. Vascular: No hyperdense vessel or unexpected calcification. Skull: The skull is intact. No fracture or focal lesion identified. Sinuses/Orbits: The visualized paranasal sinuses and mastoid air cells are clear. The orbits and globes intact. Other: None IMPRESSION: No acute intracranial abnormality. Findings consistent with age related atrophy and chronic small vessel ischemia Electronically Signed   By: Jonna Clark M.D.   On: 04/12/2020 23:49    Procedures Procedures (including critical care time)  Medications Ordered in ED Medications  sodium chloride flush (NS) 0.9 % injection 3 mL (3 mLs Intravenous Not Given 04/12/20 2330)  sodium chloride 0.9 % bolus 500 mL (0 mLs Intravenous Stopped 04/13/20 0051)    ED Course  I have reviewed the triage vital signs and the nursing notes.  Pertinent labs & imaging results that were available during my care of the patient were reviewed by me and considered in my medical decision making (see chart for details).    MDM Rules/Calculators/A&P                           Patient presents with episode of confusion and hallucination.  Found wandering outside.  Recent diagnosis of Alzheimer's dementia.  She is overall nontoxic and vital signs are reassuring.  She is without physical complaint.  Unclear how long she was outside.  Differential includes but not limited to acute delirium associated with Alzheimer's, heat exhaustion, infection.  She is nonfocal on exam.  Doubt acute stroke.  Work-up initiated.  Labs reviewed.   No significant metabolic derangement.  Slight leukocytosis.  Urinalysis shows  6-10 white cells and many bacteria.  We will send culture.  Certainly a urinary tract infection could precipitate confusion in an already demented patient.  Will elect to treat.  Patient was given 500 cc of fluid although she does not have any significant signs of dehydration on physical exam or labs.  CT head does not show any intracranial bleed.  Discussed the work-up with the granddaughter and the patient.  Discussed the importance of patient safety.  This could be related to UTI but also could be worsening of dementia.  Patient needs to have someone stay with her until PCP follow-up.  She may need to ultimately be in a more controlled living environment with a caregiver.  Granddaughter stated understanding.  After history, exam, and medical workup I feel the patient has been appropriately medically screened and is safe for discharge home. Pertinent diagnoses were discussed with the patient. Patient was given return precautions.   Final Clinical Impression(s) / ED Diagnoses Final diagnoses:  Confusion  Acute cystitis without hematuria    Rx / DC Orders ED Discharge Orders         Ordered    cephALEXin (KEFLEX) 500 MG capsule  3 times daily     Discontinue  Reprint     04/13/20 0103           Shon Baton, MD 04/13/20 0127

## 2020-04-13 NOTE — Discharge Instructions (Addendum)
You were seen today for some confusion.  This may be related to an early urinary tract infection.  You may have also suffered from some heat exhaustion.  Make sure that you have someone stay with you for the next 1-2 nights.  Follow-up closely with your primary physician.

## 2020-04-14 LAB — URINE CULTURE: Culture: 60000 — AB

## 2020-04-15 ENCOUNTER — Telehealth: Payer: Self-pay

## 2020-04-15 NOTE — Telephone Encounter (Signed)
Post ED Visit - Positive Culture Follow-up  Culture report reviewed by antimicrobial stewardship pharmacist: [facility] []  , Pharm.D. []  Enzo Bi, Pharm.D., BCPS AQ-ID []  , Pharm.D., BCPS []  Celedonio Miyamoto, Pharm.D., BCPS []  [place], Garvin Fila.D., BCPS, AAHIVP []  , Pharm.D., BCPS, AAHIVP [x]  Georgina Pillion, PharmD, BCPS []  , PharmD, BCPS []  [place], PharmD, BCPS []  [address], PharmD []  , PharmD, BCPS []  Estella Husk, PharmD  Pharmacy Team []  Lysle Pearl, PharmD []  , PharmD []  Phillips Climes, PharmD []  , Rph []  Agapito Games) , PharmD []  Verlan Friends, PharmD []  , PharmD []  Mervyn Gay, PharmD []  , PharmD []  Vinnie Level, PharmD []  [facility], PharmD []  , PharmD []  Len Childs, PharmD   Positive urine culture Treated with Cephalexin, organism sensitive to the same and no further patient follow-up is required at this time.  04/15/2020, 9:04 AM

## 2020-05-03 ENCOUNTER — Other Ambulatory Visit: Payer: Self-pay | Admitting: Neurology

## 2020-05-04 MED ORDER — MEMANTINE HCL 10 MG PO TABS: 10.0000 mg | ORAL_TABLET | Freq: Every day | ORAL | 2 refills | Status: DC

## 2020-05-04 NOTE — Telephone Encounter (Signed)
Spoke with pts niece. Refill request for donepezil 5 mg @ HS. Last OV note from 01/2020 states that dose was to be increased to 10 mg @ HS after 4 weeks then memantine would be added after another 4 weeks. She states her mom has been making sure pt has been taking her meds over the past couple of weeks but unsure if she was taking them regularly before that. Her mom, pts sister, is at work and can't be reached. Spoke with Dr Everlena Cooper and will send in prescription for donepezil 10 mg @ HS for 28 days and plan to add in memantine 10 mg daily in 4 weeks. She verbalized understanding of this plan and will let her mom know.

## 2020-05-05 ENCOUNTER — Other Ambulatory Visit: Payer: Self-pay | Admitting: Internal Medicine

## 2020-05-20 ENCOUNTER — Telehealth: Payer: Self-pay | Admitting: Neurology

## 2020-05-20 NOTE — Telephone Encounter (Signed)
Patient's niece has a question about a medication to be refilled but doesn't know what medication patient is taking. Requesting a call back

## 2020-05-21 NOTE — Telephone Encounter (Signed)
Telephone call to pt niece, Please explain how the pt supposed to donepezil.   Per Dr. Everlena Cooper note pt Will start donepezil 5mg  at bedtime, increase to 10mg  at bedtime in 4 weeks.  Once she has been on 10mg  at bedtime for 4 weeks, will start memantine.   Refills are at the pharmacy waiting on pt.

## 2020-06-15 ENCOUNTER — Other Ambulatory Visit: Payer: Self-pay | Admitting: Neurology

## 2020-06-23 ENCOUNTER — Other Ambulatory Visit: Payer: Self-pay | Admitting: Internal Medicine

## 2020-08-16 ENCOUNTER — Other Ambulatory Visit: Payer: Self-pay | Admitting: Neurology

## 2020-08-19 ENCOUNTER — Ambulatory Visit: Payer: Medicare PPO | Admitting: Neurology

## 2020-08-31 ENCOUNTER — Ambulatory Visit: Payer: Self-pay

## 2020-09-24 ENCOUNTER — Telehealth: Payer: Self-pay | Admitting: Internal Medicine

## 2020-09-24 NOTE — Telephone Encounter (Signed)
LVM for pt to rtn my call to schedule AWV with [facility]. If pt calls the office please schedule this appt with [facility].    Thanks

## 2020-11-17 ENCOUNTER — Other Ambulatory Visit: Payer: Self-pay | Admitting: Internal Medicine

## 2020-11-17 ENCOUNTER — Ambulatory Visit: Payer: Medicare PPO | Admitting: Neurology

## 2020-11-17 ENCOUNTER — Other Ambulatory Visit: Payer: Self-pay

## 2020-11-17 ENCOUNTER — Encounter: Payer: Self-pay | Admitting: Neurology

## 2020-11-17 VITALS — BP 112/72 | HR 114 | Ht 63.0 in | Wt 142.6 lb

## 2020-11-17 DIAGNOSIS — G301 Alzheimer's disease with late onset: Secondary | ICD-10-CM | POA: Diagnosis not present

## 2020-11-17 DIAGNOSIS — F0281 Dementia in other diseases classified elsewhere with behavioral disturbance: Secondary | ICD-10-CM

## 2020-11-17 DIAGNOSIS — F02818 Dementia in other diseases classified elsewhere, unspecified severity, with other behavioral disturbance: Secondary | ICD-10-CM

## 2020-11-17 MED ORDER — CITALOPRAM HYDROBROMIDE 20 MG PO TABS: 20.0000 mg | ORAL_TABLET | Freq: Every day | ORAL | 3 refills | Status: DC

## 2020-11-17 MED ORDER — DONEPEZIL HCL 10 MG PO TABS: ORAL_TABLET | ORAL | 3 refills | Status: DC

## 2020-11-17 MED ORDER — MEMANTINE HCL 10 MG PO TABS: ORAL_TABLET | ORAL | 3 refills | Status: DC

## 2020-11-17 NOTE — Patient Instructions (Signed)
1. Increase Memantine (Namenda) 10mg : Take 1 tablet twice a day  2. Restart Citalopram (Celexa) 20mg : take 1 tablet daily  3. Continue Donepezil 10mg  daily  4.Continue close supervision  5. Follow-up in 6-8 months, call for any changes   FALL PRECAUTIONS: Be cautious when walking. Scan the area for obstacles that may increase the risk of trips and falls. When getting up in the mornings, sit up at the edge of the bed for a few minutes before getting out of bed. Consider elevating the bed at the head end to avoid drop of blood pressure when getting up. Walk always in a well-lit room (use night lights in the walls). Avoid area rugs or power cords from appliances in the middle of the walkways. Use a walker or a cane if necessary and consider physical therapy for balance exercise. Get your eyesight checked regularly.  HOME SAFETY: Consider the safety of the kitchen when operating appliances like stoves, microwave oven, and blender. Consider having supervision and share cooking responsibilities until no longer able to participate in those. Accidents with firearms and other hazards in the house should be identified and addressed as well.  ABILITY TO BE LEFT ALONE: If patient is unable to contact 911 operator, consider using LifeLine, or when the need is there, arrange for someone to stay with patients. Smoking is a fire hazard, consider supervision or cessation. Risk of wandering should be assessed by caregiver and if detected at any point, supervision and safe proof recommendations should be instituted.  MEDICATION SUPERVISION: Inability to self-administer medication needs to be constantly addressed. Implement a mechanism to ensure safe administration of the medications.  RECOMMENDATIONS FOR ALL PATIENTS WITH MEMORY PROBLEMS: 1. Continue to exercise (Recommend 30 minutes of walking everyday, or 3 hours every week) 2. Increase social interactions - continue going to [place] and enjoy social gatherings  with friends and family 3. Eat healthy, avoid fried foods and eat more fruits and vegetables 4. Maintain adequate blood pressure, blood sugar, and blood cholesterol level. Reducing the risk of stroke and cardiovascular disease also helps promoting better memory. 5. Avoid stressful situations. Live a simple life and avoid aggravations. Organize your time and prepare for the next day in anticipation. 6. Sleep well, avoid any interruptions of sleep and avoid any distractions in the bedroom that may interfere with adequate sleep quality 7. Avoid sugar, avoid sweets as there is a strong link between excessive sugar intake, diabetes, and cognitive impairment We discussed the Mediterranean diet, which has been shown to help patients reduce the risk of progressive memory disorders and reduces cardiovascular risk. This includes eating fish, eat fruits and green leafy vegetables, nuts like almonds and hazelnuts, walnuts, and also use olive oil. Avoid fast foods and fried foods as much as possible. Avoid sweets and sugar as sugar use has been linked to worsening of memory function.  There is always a concern of gradual progression of memory problems. If this is the case, then we may need to adjust level of care according to patient needs. Support, both to the patient and caregiver, should then be put into place.

## 2020-11-17 NOTE — Progress Notes (Signed)
NEUROLOGY CONSULTATION NOTE  [name] MRN: [id number] DOB: 1951-08-13  Referring provider: Dr. Lew Dawes Primary care provider: Dr. Lew Dawes  Reason for consult:  dementia  Dear Dr Alain Marion:  Thank you for your kind referral of [name] for consultation of the above symptoms. Although her history is well known to you, please allow me to reiterate it for the purpose of our medical record. The patient was accompanied to the clinic by her niece and [name] who also provides collateral information. Records and images were personally reviewed where available.   HISTORY OF PRESENT ILLNESS: This is a pleasant 70 year old right-handed woman with a history of hypertension, hyperlipidemia, depression, IBS, Alzheimer's dementia with behavioral disturbance, presenting to establish care. She was previously seen by Dr. Tomi Likens, records were reviewed. She started having memory issues in 2013 after a concussion. In 2018, she got lost driving. 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 Memory difficulties continued to worsen in 2020, she would forget conversations quickly and forget familiar places. Repeat Neuropsychological evaluation in March 2021 indicated significant decline in several areas, including measures of attention/working memory, verbal fluency, and visuospatial functioning and executive functioning. Etiology most likely Alzheimer's disease. Concern was raised that she has lost the ability to independently make medical decisions and manage medications/finances. Guardianship may be appropriate if caregiving is met with resistance. She was in the ER in June 2021 when family could not get in touch with her and found her wandering outside, she had locked herself outside and did not know why she was outside or how long she had been there. She appeared to be hallucinating, talking about a giraffe in the backyard and a snake in the  house. She was treated for a UTI. She has been living with her sister since then. She thinks her memory is good, "might not be perfect." [name] took over finances after the incident last June. She manages her own pillbox and denies missing medications. She has not been driving since June. She does not cook. She has occasional word-finding difficulties, some days are better than others. She is independent with dressing and bathing. No further hallucinations. She gets frustrated with herself and can be a little short with other, no paranoia. [name] thinks she ran out of Citalopram and feels that she looks sad. She wants to go home and [name] feels they are keeping her hostage. She is on Donepezil 62m daily and Memantine 161mdaily without side effects.  She denies any headaches, dizziness, diplopia, dysarthria, dysphagia, neck/back pain, focal numbness/tingling/weakness, bowel/bladder dysfunction. No anosmia, tremors, no falls. Sleep is good. Her father had dementia. She denies any significant head injuries, no alcohol use. She used to work as an inProduction assistant, radioor [employer]  Laboratory Data: Lab Results  Component Value Date   TSH 0.77 06/12/2019   Lab Results  Component Value Date   [id number] 1748/19/2020   CT head without contrast done 03/2020 showed diffuse atrophy, chronic microvascular disease.   PAST MEDICAL HISTORY: Past Medical History:  Diagnosis Date  . [place] disease ([place]  . Depression   . Hyperlipidemia   . LBP (low back pain)   . OA (osteoarthritis)   . Vitamin B 12 deficiency     PAST SURGICAL HISTORY: Past Surgical History:  Procedure Laterality Date  . CHOLECYSTECTOMY  1990    MEDICATIONS: Current Outpatient Medications on File Prior to Visit  Medication Sig Dispense Refill  . acetaminophen (  TYLENOL) 500 MG tablet Take 500 mg by mouth every 6 (six) hours as needed.    Marland Kitchen aspirin 81 MG tablet Take 81 mg by mouth daily.      . cephALEXin  (KEFLEX) 500 MG capsule Take 1 capsule (500 mg total) by mouth 3 (three) times daily. 21 capsule 0  . Cholecalciferol 1000 UNITS capsule Take 1,000 Units by mouth daily.    . citalopram (CELEXA) 20 MG tablet TAKE 1 TABLET BY MOUTH EVERY DAY 90 tablet 1  . donepezil (ARICEPT) 10 MG tablet TAKE 1 TABLET BY MOUTH EVERYDAY AT BEDTIME 90 tablet 1  . doxycycline (VIBRAMYCIN) 100 MG capsule Take 1 capsule (100 mg total) by mouth 2 (two) times daily. One po bid x 70 days (Patient not taking: Reported on 02/18/2020) 14 capsule 0  . loratadine (CLARITIN) 10 MG tablet Take 1 tablet (10 mg total) by mouth daily. (Patient taking differently: Take 10 mg by mouth daily as needed. ) 30 tablet 3  . memantine (NAMENDA) 10 MG tablet TAKE 1 TABLET BY MOUTH EVERY DAY 90 tablet 1   No current facility-administered medications on file prior to visit.    ALLERGIES: Allergies  Allergen Reactions  . Erythromycin Base Nausea Only  . Other Nausea And Vomiting and Other (See Comments)    Mycins-dizziness    FAMILY HISTORY: Family History  Problem Relation Age of Onset  . Pneumonia Father   . Diabetes Mother   . Heart disease Mother   . Hypertension Mother   . Pancreatic cancer Mother   . Liver cancer Mother   . Diabetes Other   . Hypertension Other   . Colon cancer Maternal Grandmother 23    SOCIAL HISTORY: Social History   Socioeconomic History  . Marital status: Single    Spouse name: Not on file  . Number of children: 0  . Years of education: 88  . Highest education level: Not on file  Occupational History  . Occupation: retired  Tobacco Use  . Smoking status: Never Smoker  . Smokeless tobacco: Never Used  Vaping Use  . Vaping Use: Never used  Substance and Sexual Activity  . Alcohol use: No  . Drug use: No  . Sexual activity: Not Currently  Other Topics Concern  . Not on file  Social History Narrative   Lives alone in a 1 1/2 story home.  No children.  Retired from [employer].   Education: high school.  Right handed   Drinks caffeine on occasion   Social Determinants of Health   Financial Resource Strain: Not on file  Food Insecurity: Not on file  Transportation Needs: Not on file  Physical Activity: Not on file  Stress: Not on file  Social Connections: Not on file  Intimate Partner Violence: Not on file     PHYSICAL EXAM: Vitals:   11/17/20 1026  BP: 112/72  Pulse: (!) 114  SpO2: 94%   General: No acute distress, flat affect Head:  Normocephalic/atraumatic Skin/Extremities: No rash, no edema Neurological Exam: Mental status: alert and oriented to person, place, year. Month is February. No dysarthria or aphasia, Fund of knowledge is reduced.  Recent and remote memory are impaired. Attention and concentration are reduced.    Able to name objects, difficulty with repetition. [place] score 11/30 Montreal Cognitive Assessment  11/17/2020 10/15/2019 03/30/2017  Visuospatial/ Executive (0/5) 1 - 2  Naming (0/3) 3 - 2  Attention: Read list of digits (0/2) 0 1 1  Attention: Read list of  letters (0/1) _0 Attention: Serial 7 subtraction starting at 100 (0/3) _1 Language: Repeat phrase (0/2) 0 1 1  Language : Fluency (0/1) 0 1 1  Abstraction (0/2) _2 Delayed Recall (0/5) 0 0 1  Orientation (0/6) _3 Total 11 - 19  Adjusted Score (based on education) 11 - 20    Cranial nerves: CN I: not tested CN II: pupils equal, round CN III, IV, VI:  full range of motion CN VII: upper and lower face symmetric CN VIII: hearing intact to conversation Motor: moves all extremities symmetrically, at least anti-gravity Gait: narrow-based and steady  IMPRESSION: This is a pleasant 70 year old right-handed woman with a history of hypertension, hyperlipidemia, depression, IBS, Alzheimer's dementia with behavioral disturbance. Results of Neuropsychological evaluation in 12/2019 discussed with patient and hcPOA, including diagnosis, prognosis, and recommendations for  increased supervision. Discussed increasing Memantine to 31m BID, continue Donepezil 166mdaily. She has not been taking the citalopram, restart citalopram 2062maily. We discussed that she cannot live alone and needs supervision, no further driving. Discussed the importance of control of vascular risk factors, physical exercise, and brain stimulation exercises for brain health. Follow-up in 6-8 months, they know to call for any changes.    Thank you for allowing me to participate in the care of this patient. Please do not hesitate to call for any questions or concerns.   KarEllouise Newer.D.  CC: Dr. PloAlain Marion

## 2021-01-10 ENCOUNTER — Other Ambulatory Visit: Payer: Self-pay | Admitting: Neurology

## 2021-01-18 ENCOUNTER — Encounter: Payer: Self-pay | Admitting: Family Medicine

## 2021-01-18 ENCOUNTER — Ambulatory Visit: Payer: Medicare PPO | Admitting: Family Medicine

## 2021-01-18 NOTE — Progress Notes (Deleted)
GETHSEMANE FISCHLER DOB: 1951/03/18 Encounter date: 01/18/2021  This is a 70 y.o. female who presents to establish care. No chief complaint on file.   History of present illness:  ***   Past Medical History:  Diagnosis Date  . Acute sialoadenitis 02/28/2011   B submand   . Alzheimer disease (HCC)   . CONTACT DERMATITIS&OTHER ECZEMA DUE TO PLANTS 02/13/2008   Qualifier: Diagnosis of  By: Yetta Barre MD, Bernadene Bell.   . Depression   . Hyperlipidemia   . LBP (low back pain)   . OA (osteoarthritis)   . Paronychia of great toe of left foot 01/21/2013   3/14 The toenail is going to come off - likely s/p trauma 1 mo ago   . Pneumonia due to organism 05/05/2014   7/15 R (clinical dx) 9/16 RML (CT)   . Vitamin B 12 deficiency    Past Surgical History:  Procedure Laterality Date  . CHOLECYSTECTOMY  1990   Allergies  Allergen Reactions  . Erythromycin Base Nausea Only  . Other Nausea And Vomiting and Other (See Comments)    Mycins-dizziness   No outpatient medications have been marked as taking for the 01/18/21 encounter (Appointment) with Wynn Banker, MD.   Social History   Tobacco Use  . Smoking status: Never Smoker  . Smokeless tobacco: Never Used  Substance Use Topics  . Alcohol use: No   Family History  Problem Relation Age of Onset  . Pneumonia Father   . Diabetes Mother   . Heart disease Mother   . Hypertension Mother   . Pancreatic cancer Mother   . Liver cancer Mother   . Diabetes Other   . Hypertension Other   . Colon cancer Maternal Grandmother 70     Review of Systems  Objective:  There were no vitals taken for this visit.      BP Readings from Last 3 Encounters:  11/17/20 112/72  04/13/20 (!) 118/54  04/12/20 134/69   Wt Readings from Last 3 Encounters:  11/17/20 142 lb 9.6 oz (64.7 kg)  04/12/20 169 lb 15.6 oz (77.1 kg)  04/12/20 170 lb (77.1 kg)    Physical Exam  Assessment/Plan:  There are no diagnoses linked to this encounter.  No  follow-ups on file.  Theodis Shove, MD

## 2021-03-17 ENCOUNTER — Encounter: Payer: Self-pay | Admitting: Family Medicine

## 2021-03-17 ENCOUNTER — Other Ambulatory Visit: Payer: Self-pay

## 2021-03-17 ENCOUNTER — Ambulatory Visit: Payer: Medicare PPO | Admitting: Family Medicine

## 2021-03-17 VITALS — BP 118/80 | HR 77 | Temp 98.0°F | Ht 63.0 in | Wt 136.8 lb

## 2021-03-17 DIAGNOSIS — I10 Essential (primary) hypertension: Secondary | ICD-10-CM | POA: Diagnosis not present

## 2021-03-17 DIAGNOSIS — R195 Other fecal abnormalities: Secondary | ICD-10-CM | POA: Diagnosis not present

## 2021-03-17 DIAGNOSIS — E538 Deficiency of other specified B group vitamins: Secondary | ICD-10-CM | POA: Diagnosis not present

## 2021-03-17 DIAGNOSIS — Z1159 Encounter for screening for other viral diseases: Secondary | ICD-10-CM | POA: Diagnosis not present

## 2021-03-17 DIAGNOSIS — E559 Vitamin D deficiency, unspecified: Secondary | ICD-10-CM

## 2021-03-17 DIAGNOSIS — E785 Hyperlipidemia, unspecified: Secondary | ICD-10-CM

## 2021-03-17 DIAGNOSIS — K589 Irritable bowel syndrome without diarrhea: Secondary | ICD-10-CM

## 2021-03-17 MED ORDER — SHINGRIX 50 MCG/0.5ML IM SUSR
0.5000 mL | Freq: Once | INTRAMUSCULAR | 0 refills | Status: AC
Start: 2021-03-17 — End: 2021-03-17

## 2021-03-17 NOTE — Progress Notes (Signed)
[name] DOB: 1951/04/07 Encounter date: 03/17/2021  This isa 70 y.o. female who presents to establish care. Chief Complaint  Patient presents with  . Establish Care    History of present illness: Here with niece today.   HTN listed as dx; not on medication.   Arthritis: she denies arthritis pain.   B12 deficiency: has been giving self shots for a long time. Would be agreeable to doing sublingual dosing.   Dementia: follows with Dr. Karel Jarvis. Last visit was 11/17/20: memantine 10mg  (increased at visit), celexa 20mg  (just restarted), donepezil 10mg  daily with 6 mo follow up. She seems to be doing ok with mood overall. Lives with sister, (who is my patient).   Colonoscopy repeat suggested 2024.   Past Medical History:  Diagnosis Date  . Acute sialoadenitis 02/28/2011   B submand   . Altered consciousness 02/17/2017   4/18  transient impairment of awareness ?etiology x 10 min ASA qd Dr 04/30/2011  . Alzheimer disease (HCC)   . CONTACT DERMATITIS&OTHER ECZEMA DUE TO PLANTS 02/13/2008   Qualifier: Diagnosis of  By: 5/18 MD, Everlena Cooper.   . Depression   . Hyperlipidemia   . LBP (low back pain)   . NONSPECIFIC ABNORMAL RESULTS LIVR FUNCTION STUDY 11/13/2007   Qualifier: Diagnosis of  By: Plotnikov MD, Yetta Barre   . OA (osteoarthritis)   . Paronychia of great toe of left foot 01/21/2013   3/14 The toenail is going to come off - likely s/p trauma 1 mo ago   . Pneumonia due to organism 05/05/2014   7/15 R (clinical dx) 9/16 RML (CT)   . Vitamin B 12 deficiency    Past Surgical History:  Procedure Laterality Date  . CHOLECYSTECTOMY  1990   Allergies  Allergen Reactions  . Erythromycin Base Nausea Only  . Other Nausea And Vomiting and Other (See Comments)    Mycins-dizziness   Current Meds  Medication Sig  . acetaminophen (TYLENOL) 500 MG tablet Take 500 mg by mouth every 6 (six) hours as needed.  05/07/2014 aspirin 81 MG tablet Take 81 mg by mouth daily.  . Cholecalciferol 1000  UNITS capsule Take 1,000 Units by mouth daily.  . citalopram (CELEXA) 20 MG tablet Take 1 tablet (20 mg total) by mouth daily.  . Cyanocobalamin (B-12 PO) Take by mouth daily.  8/15 donepezil (ARICEPT) 10 MG tablet TAKE 1 TABLET BY MOUTH EVERYDAY AT BEDTIME  . memantine (NAMENDA) 10 MG tablet TAKE 1 TABLET BY MOUTH EVERY DAY  . Zoster Vaccine Adjuvanted [facility]) injection Inject 0.5 mLs into the muscle once for 1 dose. Repeat in 2-6 months   Social History   Tobacco Use  . Smoking status: Never Smoker  . Smokeless tobacco: Never Used  Substance Use Topics  . Alcohol use: No   Family History  Problem Relation Age of Onset  . Pneumonia Father   . Emphysema Father        smoker  . Diabetes Mother   . Heart disease Mother   . Hypertension Mother   . Pancreatic cancer Mother   . Liver cancer Mother   . Diabetes Other   . Hypertension Other   . Colon cancer Maternal Grandmother 29  . Other Brother        died in infancy  . Pneumonia Brother 84       was smoker     Review of Systems  Constitutional: Negative for activity change, appetite change, chills, fatigue, fever and unexpected weight  change.  HENT: Negative for congestion, ear pain, hearing loss, sinus pressure, sinus pain, sore throat and trouble swallowing.   Eyes: Negative for pain and visual disturbance.  Respiratory: Negative for cough, chest tightness, shortness of breath and wheezing.   Cardiovascular: Negative for chest pain, palpitations and leg swelling.  Gastrointestinal: Positive for diarrhea. Negative for abdominal pain, blood in stool, constipation, nausea and vomiting.  Genitourinary: Negative for difficulty urinating and menstrual problem.  Musculoskeletal: Negative for arthralgias and back pain.  Skin: Negative for rash.  Neurological: Negative for dizziness, weakness, numbness and headaches.  Hematological: Negative for adenopathy. Does not bruise/bleed easily.  Psychiatric/Behavioral: Negative for sleep  disturbance and suicidal ideas. The patient is not nervous/anxious.     Objective:  BP 118/80 (BP Location: Left Arm, Patient Position: Sitting, Cuff Size: Normal)   Pulse 77   Temp 98 F (36.7 C) (Oral)   Ht 5\' 3"  (1.6 m)   Wt 136 lb 12.8 oz (62.1 kg)   BMI 24.23 kg/m   Weight: 136 lb 12.8 oz (62.1 kg)   BP Readings from Last 3 Encounters:  03/17/21 118/80  11/17/20 112/72  04/13/20 (!) 118/54   Wt Readings from Last 3 Encounters:  03/17/21 136 lb 12.8 oz (62.1 kg)  11/17/20 142 lb 9.6 oz (64.7 kg)  04/12/20 169 lb 15.6 oz (77.1 kg)    Physical Exam Constitutional:      General: She is not in acute distress.    Appearance: She is well-developed.  HENT:     Head: Normocephalic and atraumatic.     Right Ear: External ear normal.     Left Ear: External ear normal.     Mouth/Throat:     Pharynx: No oropharyngeal exudate.  Eyes:     Conjunctiva/sclera: Conjunctivae normal.     Pupils: Pupils are equal, round, and reactive to light.  Neck:     Thyroid: No thyromegaly.  Cardiovascular:     Rate and Rhythm: Normal rate and regular rhythm.     Heart sounds: Normal heart sounds. No murmur heard. No friction rub. No gallop.   Pulmonary:     Effort: Pulmonary effort is normal.     Breath sounds: Normal breath sounds.  Abdominal:     General: Bowel sounds are normal. There is no distension.     Palpations: Abdomen is soft. There is no mass.     Tenderness: There is no abdominal tenderness. There is no guarding.     Hernia: No hernia is present.  Musculoskeletal:        General: No tenderness or deformity. Normal range of motion.     Cervical back: Normal range of motion and neck supple.  Lymphadenopathy:     Cervical: No cervical adenopathy.  Skin:    General: Skin is warm and dry.     Findings: No rash.  Neurological:     Mental Status: She is alert and oriented to person, place, and time.     Deep Tendon Reflexes: Reflexes normal.     Reflex Scores:      Tricep  reflexes are 2+ on the right side and 2+ on the left side.      Bicep reflexes are 2+ on the right side and 2+ on the left side.      Brachioradialis reflexes are 2+ on the right side and 2+ on the left side.      Patellar reflexes are 2+ on the right side and 2+ on the left side.  Psychiatric:        Speech: Speech normal.        Behavior: Behavior normal.        Thought Content: Thought content normal.     Comments: More heightened in evening slight agitation/confusion per niece, but not severe. Mood stable overall, good. She is happy with living with sister.      Assessment/Plan:  1. Essential hypertension This was listed as dx; but not on meds and well controlled. Will remove from problem list.  - CBC with Differential/Platelet; Future - Comprehensive metabolic panel; Future  2. Irritable bowel syndrome, unspecified type Try bulking stools with fiber. Also ok for probiotic use. Let me know if this is not helping with looser stools.   3. Dyslipidemia  - Lipid panel; Future - TSH; Future  4. B12 deficiency She has not regularly been doing injections. Ok with oral replacement if needed. - Vitamin B12; Future - Folate; Future  5. Encounter for hepatitis C screening test for low risk patient - Hepatitis C antibody; Future  6. Vitamin D deficiency - VITAMIN D 25 Hydroxy (Vit-D Deficiency, Fractures); Future  7. Loose stools See above.   Return for pending labwork.  Theodis Shove, MD  31 minutes spent with patient, niece, chart review, exam, charting.

## 2021-03-17 NOTE — Patient Instructions (Signed)
*  consider metamucil daily or citrucel to help with stool bulking. Natural fiber in diet may also help.

## 2021-03-18 LAB — CBC WITH DIFFERENTIAL/PLATELET
Basophils Absolute: 0.1 10*3/uL (ref 0.0–0.1)
Basophils Relative: 1 % (ref 0.0–3.0)
Eosinophils Absolute: 0 10*3/uL (ref 0.0–0.7)
Eosinophils Relative: 0.7 % (ref 0.0–5.0)
HCT: 42.1 % (ref 36.0–46.0)
Hemoglobin: 14.3 g/dL (ref 12.0–15.0)
Lymphocytes Relative: 33.5 % (ref 12.0–46.0)
Lymphs Abs: 2.1 10*3/uL (ref 0.7–4.0)
MCHC: 34.1 g/dL (ref 30.0–36.0)
MCV: 94.5 fl (ref 78.0–100.0)
Monocytes Absolute: 0.6 10*3/uL (ref 0.1–1.0)
Monocytes Relative: 9.2 % (ref 3.0–12.0)
Neutro Abs: 3.4 10*3/uL (ref 1.4–7.7)
Neutrophils Relative %: 55.6 % (ref 43.0–77.0)
Platelets: 261 10*3/uL (ref 150.0–400.0)
RBC: 4.46 Mil/uL (ref 3.87–5.11)
RDW: 13 % (ref 11.5–15.5)
WBC: 6.2 10*3/uL (ref 4.0–10.5)

## 2021-03-18 LAB — COMPREHENSIVE METABOLIC PANEL
ALT: 7 U/L (ref 0–35)
AST: 15 U/L (ref 0–37)
Albumin: 3.9 g/dL (ref 3.5–5.2)
Alkaline Phosphatase: 69 U/L (ref 39–117)
BUN: 16 mg/dL (ref 6–23)
CO2: 27 mEq/L (ref 19–32)
Calcium: 9.3 mg/dL (ref 8.4–10.5)
Chloride: 104 mEq/L (ref 96–112)
Creatinine, Ser: 0.92 mg/dL (ref 0.40–1.20)
GFR: 63.51 mL/min (ref >60.00)
Glucose, Bld: 81 mg/dL (ref 70–99)
Potassium: 4 mEq/L (ref 3.5–5.1)
Sodium: 139 mEq/L (ref 135–145)
Total Bilirubin: 0.7 mg/dL (ref 0.2–1.2)
Total Protein: 6.8 g/dL (ref 6.0–8.3)

## 2021-03-18 LAB — HEPATITIS C ANTIBODY
Hepatitis C Ab: NONREACTIVE
SIGNAL TO CUT-OFF: 0 (ref <1.00)

## 2021-03-18 LAB — LIPID PANEL
Cholesterol: 210 mg/dL — ABNORMAL HIGH (ref 0–200)
HDL: 52 mg/dL (ref >39.00)
LDL Cholesterol: 139 mg/dL — ABNORMAL HIGH (ref 0–99)
NonHDL: 158.26
Total CHOL/HDL Ratio: 4
Triglycerides: 97 mg/dL (ref 0.0–149.0)
VLDL: 19.4 mg/dL (ref 0.0–40.0)

## 2021-03-18 LAB — VITAMIN B12: Vitamin B-12: 831 pg/mL (ref 211–911)

## 2021-03-18 LAB — TSH: TSH: 0.93 u[IU]/mL (ref 0.35–4.50)

## 2021-03-18 LAB — VITAMIN D 25 HYDROXY (VIT D DEFICIENCY, FRACTURES): VITD: 17.88 ng/mL — ABNORMAL LOW (ref 30.00–100.00)

## 2021-03-18 LAB — FOLATE: Folate: 8.7 ng/mL (ref >5.9)

## 2021-03-24 ENCOUNTER — Telehealth: Payer: Self-pay | Admitting: Family Medicine

## 2021-03-24 NOTE — Telephone Encounter (Addendum)
Patient is returning the call, please advise. CB is [phone]

## 2021-03-25 MED ORDER — VITAMIN D (ERGOCALCIFEROL) 1.25 MG (50000 UNIT) PO CAPS: 50000.0000 [IU] | ORAL_CAPSULE | ORAL | 0 refills | Status: DC

## 2021-03-25 NOTE — Telephone Encounter (Signed)
See results note. 

## 2021-03-25 NOTE — Addendum Note (Signed)
Addended by: Johnella Moloney on: 03/25/2021 08:50 AM   Modules accepted: Orders

## 2021-04-07 ENCOUNTER — Other Ambulatory Visit: Payer: Self-pay

## 2021-04-07 ENCOUNTER — Ambulatory Visit (INDEPENDENT_AMBULATORY_CARE_PROVIDER_SITE_OTHER): Payer: Medicare PPO

## 2021-04-07 DIAGNOSIS — Z0000 Encounter for general adult medical examination without abnormal findings: Secondary | ICD-10-CM

## 2021-04-07 NOTE — Progress Notes (Addendum)
Virtual Visit via Telephone Note  I connected with  [name] on 04/07/21 at  2:30 PM EDT by telephone and verified that I am speaking with the correct person using two identifiers.  Location: Patient: Home Provider: Office Persons participating in the virtual visit: patient/Nurse Health Advisor, along with [name] and her sister    I discussed the limitations, risks, security and privacy concerns of performing an evaluation and management service by telephone and the availability of in person appointments. The patient expressed understanding and agreed to proceed.  Interactive audio and video telecommunications were attempted between this nurse and patient, however failed, due to patient having technical difficulties OR patient did not have access to video capability.  We continued and completed visit with audio only.  Some vital signs may be absent or patient reported.   Marzella Schlein, LPN   Subjective:   [name] is a 70 y.o. female who presents for Medicare Annual (Subsequent) preventive examination.  Review of Systems     Cardiac Risk Factors include: advanced age (>65men, >44 women);hypertension;dyslipidemia     Objective:    There were no vitals filed for this visit. There is no height or weight on file to calculate BMI.  Advanced Directives 04/07/2021 11/17/2020 04/12/2020 02/18/2020 01/04/2020 08/30/2019 08/24/2018  Does Patient Have a Medical Advance Directive? Yes Yes Yes No No No No  Type of Sales promotion account executive of [employer] Power of [place];Living will - - - -  Copy of Healthcare Power of Attorney in Chart? Yes - validated most recent copy scanned in chart (See row information) - - - - - -  Would patient like information on creating a medical advance directive? - - - - - Yes (ED - Information included in AVS) Yes (ED - Information included in AVS)    Current Medications (verified) Outpatient  Encounter Medications as of 04/07/2021  Medication Sig   acetaminophen (TYLENOL) 500 MG tablet Take 500 mg by mouth every 6 (six) hours as needed.   aspirin 81 MG tablet Take 81 mg by mouth daily.   citalopram (CELEXA) 20 MG tablet Take 1 tablet (20 mg total) by mouth daily.   Cyanocobalamin (B-12 PO) Take by mouth daily.   memantine (NAMENDA) 10 MG tablet TAKE 1 TABLET BY MOUTH EVERY DAY   Vitamin D, Ergocalciferol, (DRISDOL) 1.25 MG (50000 UNIT) CAPS capsule Take 1 capsule (50,000 Units total) by mouth every 7 (seven) days.   Cholecalciferol 1000 UNITS capsule Take 1,000 Units by mouth daily. (Patient not taking: Reported on 04/07/2021)   donepezil (ARICEPT) 10 MG tablet TAKE 1 TABLET BY MOUTH EVERYDAY AT BEDTIME (Patient not taking: Reported on 04/07/2021)   loratadine (CLARITIN) 10 MG tablet Take 1 tablet (10 mg total) by mouth daily. (Patient not taking: Reported on 11/17/2020)   No facility-administered encounter medications on file as of 04/07/2021.    Allergies (verified) Erythromycin base and Other   History: Past Medical History:  Diagnosis Date   Acute sialoadenitis 02/28/2011   B submand    Altered consciousness 02/17/2017   4/18  transient impairment of awareness ?etiology x 10 min ASA qd Dr Everlena Cooper   Alzheimer disease [facility])    CONTACT DERMATITIS&OTHER ECZEMA DUE TO PLANTS 02/13/2008   Qualifier: Diagnosis of  By: Yetta Barre MD, Bernadene Bell.    Depression    Hyperlipidemia    LBP (low back pain)    NONSPECIFIC ABNORMAL RESULTS LIVR FUNCTION STUDY 11/13/2007  Qualifier: Diagnosis of  By: Plotnikov MD, Georgina Quint    OA (osteoarthritis)    Paronychia of great toe of left foot 01/21/2013   3/14 The toenail is going to come off - likely s/p trauma 1 mo ago    Pneumonia due to organism 05/05/2014   7/15 R (clinical dx) 9/16 RML (CT)    Vitamin B 12 deficiency    Past Surgical History:  Procedure Laterality Date   CHOLECYSTECTOMY  1990   Family History  Problem Relation Age of Onset    Pneumonia Father    Emphysema Father        smoker   Diabetes Mother    Heart disease Mother    Hypertension Mother    Pancreatic cancer Mother    Liver cancer Mother    Diabetes Other    Hypertension Other    Colon cancer Maternal Grandmother 18   Other Brother        died in infancy   Pneumonia Brother 70       was smoker   Social History   Socioeconomic History   Marital status: Single    Spouse name: Not on file   Number of children: 0   Years of education: 12   Highest education level: Not on file  Occupational History   Occupation: retired  Tobacco Use   Smoking status: Never   Smokeless tobacco: Never  Vaping Use   Vaping Use: Never used  Substance and Sexual Activity   Alcohol use: No   Drug use: No   Sexual activity: Not Currently  Other Topics Concern   Not on file  Social History Narrative   Lives with sister in a 1 1/2 story home.  No children.  Retired from [employer].  Education: high school.  Right handed   Drinks caffeine on occasion   Social Determinants of Health   Financial Resource Strain: Low Risk    Difficulty of Paying Living Expenses: Not hard at all  Food Insecurity: No Food Insecurity   Worried About Programme researcher, broadcasting/film/video in the Last Year: Never true   Barista in the Last Year: Never true  Transportation Needs: No Transportation Needs   Lack of Transportation (Medical): No   Lack of Transportation (Non-Medical): No  Physical Activity: Inactive   Days of Exercise per Week: 0 days   Minutes of Exercise per Session: 0 min  Stress: No Stress Concern Present   Feeling of Stress : Not at all  Social Connections: Moderately Isolated   Frequency of Communication with Friends and Family: More than three times a week   Frequency of Social Gatherings with Friends and Family: More than three times a week   Attends Religious Services: 1 to 4 times per year   Active Member of [employer] or Organizations: No   Attends Hospital doctor: Never   Marital Status: Never married    Tobacco Counseling Counseling given: Not Answered   Clinical Intake:  Pre-visit preparation completed: Yes  Pain : No/denies pain     BMI - recorded: 24.24 Nutritional Status: BMI of 19-24  Normal Nutritional Risks: None Diabetes: No  How often do you need to have someone help you when you read instructions, pamphlets, or other written materials from your doctor or pharmacy?: 1 - Never  Diabetic?no  Interpreter Needed?: No  Information entered by :: Lanier Ensign, LPN   Activities of Daily Living In your present state of health, do  you have any difficulty performing the following activities: 04/07/2021  Hearing? N  Vision? N  Difficulty concentrating or making decisions? Y  Walking or climbing stairs? N  Dressing or bathing? N  Doing errands, shopping? N  Preparing Food and eating ? N  Using the Toilet? N  In the past six months, have you accidently leaked urine? N  Do you have problems with loss of bowel control? N  Managing your Medications? N  Managing your Finances? N  Housekeeping or managing your Housekeeping? N  Some recent data might be hidden    Patient Care Team: Wynn Banker, MD as PCP - General (Family Medicine) Jethro Bolus, MD as Consulting Physician (Ophthalmology) Drema Dallas, DO as Consulting Physician (Neurology) Van Clines, MD as Consulting Physician (Neurology)  Indicate any recent Medical Services you may have received from other than [facility] providers in the past year (date may be approximate).     Assessment:   This is a routine wellness examination for [name].  Hearing/Vision screen Hearing Screening - Comments:: Pt denies any hearing issues  Vision Screening - Comments:: Pt follows up with Dr Nile Riggs for annual eye exams   Dietary issues and exercise activities discussed: Current Exercise Habits: The patient does not participate in regular exercise at present    Goals Addressed             This Visit's Progress    Patient Stated       Lose weight         Depression Screen PHQ 2/9 Scores 04/07/2021 08/30/2019 08/24/2018 07/31/2017 02/17/2017  PHQ - 2 Score 0 1 0 2 0  PHQ- 9 Score - 1 0 4 -    Fall Risk Fall Risk  04/07/2021 11/17/2020 02/18/2020 10/14/2019 08/30/2019  Falls in the past year? 0 0 0 1 0  Number falls in past yr: 0 1 0 1 0  Injury with Fall? 0 0 0 1 0  Risk for fall due to : - - - - -  Follow up Falls prevention discussed - - - -    FALL RISK PREVENTION PERTAINING TO THE HOME:  Any stairs in or around the home? No  If so, are there any without handrails? No  Home free of loose throw rugs in walkways, pet beds, electrical cords, etc? Yes  Adequate lighting in your home to reduce risk of falls? Yes   ASSISTIVE DEVICES UTILIZED TO PREVENT FALLS:  Life alert? No  Use of a cane, walker or w/c? No  Grab bars in the bathroom? No  Shower chair or bench in shower? Yes  Elevated toilet seat or a handicapped toilet? No   TIMED UP AND GO:  Was the test performed? No     Cognitive Function: declined  MMSE - Mini Mental State Exam 01/22/2020 08/24/2018  Orientation to time 3 5  Orientation to Place 4 5  Registration 3 3  Attention/ Calculation 5 5  Recall 0 0  Language- name 2 objects 2 2  Language- repeat 0 1  Language- follow 3 step command 2 3  Language- read & follow direction 1 1  Write a sentence 1 1  Copy design 1 1  Total score 22 27   Montreal Cognitive Assessment  11/17/2020 10/15/2019 03/30/2017  Visuospatial/ Executive (0/5) 1 - 2  Naming (0/3) 3 - 2  Attention: Read list of digits (0/2) 0 1 1  Attention: Read list of letters (0/1) 1 1 1   Attention: Serial  7 subtraction starting at 100 (0/3) 1 1 2   Language: Repeat phrase (0/2) 0 1 1  Language : Fluency (0/1) 0 1 1  Abstraction (0/2) 2 1 2   Delayed Recall (0/5) 0 0 1  Orientation (0/6) 3 4 6   Total 11 - 19  Adjusted Score (based on education) 11 - 20    6CIT Screen 08/30/2019  What Year? 0 points  What month? 0 points  Count back from 20 0 points  Months in reverse 2 points  Repeat phrase 2 points    Immunizations Immunization History  Administered Date(s) Administered   Fluad Quad(high Dose 65+) 06/12/2019   Influenza Whole 08/14/2002, 09/10/2007, 08/10/2010   Influenza, High Dose Seasonal PF 08/26/2016, 07/31/2017, 08/24/2018   Influenza,inj,Quad PF,6+ Mos 08/04/2014   Influenza-Unspecified 07/31/2015   Moderna Sars-Covid-2 Vaccination 09/23/2020   PFIZER(Purple Top)SARS-COV-2 Vaccination 12/22/2019, 02/19/2020   Pneumococcal Conjugate-13 08/26/2016   Pneumococcal Polysaccharide-23 08/28/2001, 02/17/2017   Td 11/09/1998, 02/17/2010   Tdap 01/04/2020   Zoster, Live 06/07/2012    TDAP status: Up to date  Flu Vaccine status: Due, Education has been provided regarding the importance of this vaccine. Advised may receive this vaccine at local pharmacy or Health Dept. Aware to provide a copy of the vaccination record if obtained from local pharmacy or Health Dept. Verbalized acceptance and understanding.    Covid-19 vaccine status: Completed vaccines  Qualifies for Shingles Vaccine? Yes   Zostavax completed No   Shingrix Completed?: No.    Education has been provided regarding the importance of this vaccine. Patient has been advised to call insurance company to determine out of pocket expense if they have not yet received this vaccine. Advised may also receive vaccine at local pharmacy or Health Dept. Verbalized acceptance and understanding.  Screening Tests Health Maintenance  Topic Date Due   Zoster Vaccines- Shingrix (1 of 2) Never done   COVID-19 Vaccine (4 - Booster for Pfizer series) 01/22/2021   MAMMOGRAM  09/17/2021 (Originally 10/19/2018)   INFLUENZA VACCINE  05/24/2021   COLONOSCOPY (Pts [id number] Insurance coverage will need to be confirmed)  03/21/2023   TETANUS/TDAP  01/03/2030   DEXA SCAN  Completed    Hepatitis C Screening  Completed   PNA vac Low Risk Adult  Completed   HPV VACCINES  Aged Out    Health Maintenance  Health Maintenance Due  Topic Date Due   Zoster Vaccines- Shingrix (1 of 2) Never done   COVID-19 Vaccine (4 - Booster for Pfizer series) 01/22/2021    Colorectal cancer screening: Type of screening: Colonoscopy. Completed 03/20/13. Repeat every 10 years  Mammogram status: Completed 10/19/16. Repeat every year  Bone Density status: Completed 09/25/17. Results reflect: Bone density results: OSTEOPENIA. Repeat every 2-3 years.   Additional Screening:  Hepatitis C Screening:  Completed 03/17/21  Vision Screening: Recommended annual ophthalmology exams for early detection of glaucoma and other disorders of the eye. Is the patient up to date with their annual eye exam?  Yes  Who is the provider or what is the name of the office in which the patient attends annual eye exams? Dr Nile RiggsShapiro  If pt is not established with a provider, would they like to be referred to a provider to establish care? No .   Dental Screening: Recommended annual dental exams for proper oral hygiene  Community Resource Referral / Chronic Care Management: CRR required this visit?  No   CCM required this visit?  No      Plan:  I have personally reviewed and noted the following in the patient's chart:   Medical and social history Use of alcohol, tobacco or illicit drugs  Current medications and supplements including opioid prescriptions.  Functional ability and status Nutritional status Physical activity Advanced directives List of other physicians Hospitalizations, surgeries, and ER visits in previous 12 months Vitals Screenings to include cognitive, depression, and falls Referrals and appointments  In addition, I have reviewed and discussed with patient certain preventive protocols, quality metrics, and best practice recommendations. A written personalized care plan for preventive  services as well as general preventive health recommendations were provided to patient.     Marzella Schlein, LPN   [id number]   Nurse Notes: None

## 2021-04-07 NOTE — Patient Instructions (Signed)
[name] , Thank you for taking time to come for your Medicare Wellness Visit. I appreciate your ongoing commitment to your health goals. Please review the following plan we discussed and let me know if I can assist you in the future.   Screening recommendations/referrals: Colonoscopy: Done 03/20/13 due 03/21/23 Mammogram: Done 10/19/16 repeat every year Bone Density: 09/25/17 Recommended yearly ophthalmology/optometry visit for glaucoma screening and checkup Recommended yearly dental visit for hygiene and checkup  Vaccinations: Influenza vaccine: due 05/24/21 Pneumococcal vaccine: completed Tdap vaccine:Up to date Due 01/03/30 Shingles vaccine: Shingrix discussed. Please contact your pharmacy for coverage information.    Covid-19:Completed2/28, 4/28, & 10/13/20  Advanced directives: Copies in chart  Conditions/risks identified: Lose weight   Next appointment: Follow up in one year for your annual wellness visit    Preventive Care 65 Years and Older, Female Preventive care refers to lifestyle choices and visits with your health care provider that can promote health and wellness. What does preventive care include? A yearly physical exam. This is also called an annual well check. Dental exams once or twice a year. Routine eye exams. Ask your health care provider how often you should have your eyes checked. Personal lifestyle choices, including: Daily care of your teeth and gums. Regular physical activity. Eating a healthy diet. Avoiding tobacco and drug use. Limiting alcohol use. Practicing safe sex. Taking low-dose aspirin every day. Taking vitamin and mineral supplements as recommended by your health care provider. What happens during an annual well check? The services and screenings done by your health care provider during your annual well check will depend on your age, overall health, lifestyle risk factors, and family history of disease. Counseling  Your health care  provider may ask you questions about your: Alcohol use. Tobacco use. Drug use. Emotional well-being. Home and relationship well-being. Sexual activity. Eating habits. History of falls. Memory and ability to understand (cognition). Work and work Astronomer. Reproductive health. Screening  You may have the following tests or measurements: Height, weight, and BMI. Blood pressure. Lipid and cholesterol levels. These may be checked every 5 years, or more frequently if you are over 63 years old. Skin check. Lung cancer screening. You may have this screening every year starting at age 37 if you have a 30-pack-year history of smoking and currently smoke or have quit within the past 15 years. Fecal occult blood test (FOBT) of the stool. You may have this test every year starting at age 77. Flexible sigmoidoscopy or colonoscopy. You may have a sigmoidoscopy every 5 years or a colonoscopy every 10 years starting at age 7. Hepatitis C blood test. Hepatitis B blood test. Sexually transmitted disease (STD) testing. Diabetes screening. This is done by checking your blood sugar (glucose) after you have not eaten for a while (fasting). You may have this done every 1-3 years. Bone density scan. This is done to screen for osteoporosis. You may have this done starting at age 62. Mammogram. This may be done every 1-2 years. Talk to your health care provider about how often you should have regular mammograms. Talk with your health care provider about your test results, treatment options, and if necessary, the need for more tests. Vaccines  Your health care provider may recommend certain vaccines, such as: Influenza vaccine. This is recommended every year. Tetanus, diphtheria, and acellular pertussis (Tdap, Td) vaccine. You may need a Td booster every 10 years. Zoster vaccine. You may need this after age 56. Pneumococcal 13-valent conjugate (PCV13) vaccine. One dose is  recommended after age  76. Pneumococcal polysaccharide (PPSV23) vaccine. One dose is recommended after age 10. Talk to your health care provider about which screenings and vaccines you need and how often you need them. This information is not intended to replace advice given to you by your health care provider. Make sure you discuss any questions you have with your health care provider. Document Released: 11/06/2015 Document Revised: 06/29/2016 Document Reviewed: 08/11/2015 Elsevier Interactive Patient Education  2017 [place] Prevention in the Home Falls can cause injuries. They can happen to people of all ages. There are many things you can do to make your home safe and to help prevent falls. What can I do on the outside of my home? Regularly fix the edges of walkways and driveways and fix any cracks. Remove anything that might make you trip as you walk through a door, such as a raised step or threshold. Trim any bushes or trees on the path to your home. Use bright outdoor lighting. Clear any walking paths of anything that might make someone trip, such as rocks or tools. Regularly check to see if handrails are loose or broken. Make sure that both sides of any steps have handrails. Any raised decks and porches should have guardrails on the edges. Have any leaves, snow, or ice cleared regularly. Use sand or salt on walking paths during winter. Clean up any spills in your garage right away. This includes oil or grease spills. What can I do in the bathroom? Use night lights. Install grab bars by the toilet and in the tub and shower. Do not use towel bars as grab bars. Use non-skid mats or decals in the tub or shower. If you need to sit down in the shower, use a plastic, non-slip stool. Keep the floor dry. Clean up any water that spills on the floor as soon as it happens. Remove soap buildup in the tub or shower regularly. Attach bath mats securely with double-sided non-slip rug tape. Do not have throw  rugs and other things on the floor that can make you trip. What can I do in the bedroom? Use night lights. Make sure that you have a light by your bed that is easy to reach. Do not use any sheets or blankets that are too big for your bed. They should not hang down onto the floor. Have a firm chair that has side arms. You can use this for support while you get dressed. Do not have throw rugs and other things on the floor that can make you trip. What can I do in the kitchen? Clean up any spills right away. Avoid walking on wet floors. Keep items that you use a lot in easy-to-reach places. If you need to reach something above you, use a strong step stool that has a grab bar. Keep electrical cords out of the way. Do not use floor polish or wax that makes floors slippery. If you must use wax, use non-skid floor wax. Do not have throw rugs and other things on the floor that can make you trip. What can I do with my stairs? Do not leave any items on the stairs. Make sure that there are handrails on both sides of the stairs and use them. Fix handrails that are broken or loose. Make sure that handrails are as long as the stairways. Check any carpeting to make sure that it is firmly attached to the stairs. Fix any carpet that is loose or worn. Avoid having  throw rugs at the top or bottom of the stairs. If you do have throw rugs, attach them to the floor with carpet tape. Make sure that you have a light switch at the top of the stairs and the bottom of the stairs. If you do not have them, ask someone to add them for you. What else can I do to help prevent falls? Wear shoes that: Do not have high heels. Have rubber bottoms. Are comfortable and fit you well. Are closed at the toe. Do not wear sandals. If you use a stepladder: Make sure that it is fully opened. Do not climb a closed stepladder. Make sure that both sides of the stepladder are locked into place. Ask someone to hold it for you, if  possible. Clearly mark and make sure that you can see: Any grab bars or handrails. First and last steps. Where the edge of each step is. Use tools that help you move around (mobility aids) if they are needed. These include: Canes. Walkers. Scooters. Crutches. Turn on the lights when you go into a dark area. Replace any light bulbs as soon as they burn out. Set up your furniture so you have a clear path. Avoid moving your furniture around. If any of your floors are uneven, fix them. If there are any pets around you, be aware of where they are. Review your medicines with your doctor. Some medicines can make you feel dizzy. This can increase your chance of falling. Ask your doctor what other things that you can do to help prevent falls. This information is not intended to replace advice given to you by your health care provider. Make sure you discuss any questions you have with your health care provider. Document Released: 08/06/2009 Document Revised: 03/17/2016 Document Reviewed: 11/14/2014 Elsevier Interactive Patient Education  2017 [employer].

## 2021-05-18 ENCOUNTER — Other Ambulatory Visit: Payer: Self-pay | Admitting: Family Medicine

## 2021-05-18 DIAGNOSIS — Z1231 Encounter for screening mammogram for malignant neoplasm of breast: Secondary | ICD-10-CM

## 2021-06-16 ENCOUNTER — Other Ambulatory Visit: Payer: Self-pay | Admitting: Family Medicine

## 2021-06-16 MED ORDER — DONEPEZIL HCL 10 MG PO TABS: ORAL_TABLET | ORAL | 1 refills | Status: DC

## 2021-06-19 DIAGNOSIS — U071 COVID-19: Secondary | ICD-10-CM | POA: Diagnosis not present

## 2021-07-09 ENCOUNTER — Other Ambulatory Visit: Payer: Self-pay

## 2021-07-09 ENCOUNTER — Ambulatory Visit
Admission: RE | Admit: 2021-07-09 | Discharge: 2021-07-09 | Disposition: A | Discharge status: Home / Self Care | Payer: Medicare PPO | Source: Ambulatory Visit | Attending: Family Medicine | Admitting: Family Medicine

## 2021-07-09 DIAGNOSIS — Z1231 Encounter for screening mammogram for malignant neoplasm of breast: Secondary | ICD-10-CM | POA: Diagnosis not present

## 2021-07-28 ENCOUNTER — Ambulatory Visit: Payer: Medicare PPO | Admitting: Neurology

## 2021-08-05 ENCOUNTER — Encounter: Payer: Self-pay | Admitting: Physician Assistant

## 2021-08-05 ENCOUNTER — Ambulatory Visit: Payer: Medicare PPO | Admitting: Physician Assistant

## 2021-08-05 ENCOUNTER — Other Ambulatory Visit: Payer: Self-pay

## 2021-08-05 VITALS — BP 130/70 | HR 74 | Resp 20 | Ht 64.0 in | Wt 138.0 lb

## 2021-08-05 DIAGNOSIS — F02818 Dementia in other diseases classified elsewhere, unspecified severity, with other behavioral disturbance: Secondary | ICD-10-CM | POA: Diagnosis not present

## 2021-08-05 DIAGNOSIS — F09 Unspecified mental disorder due to known physiological condition: Secondary | ICD-10-CM | POA: Diagnosis not present

## 2021-08-05 DIAGNOSIS — G301 Alzheimer's disease with late onset: Secondary | ICD-10-CM

## 2021-08-05 DIAGNOSIS — R413 Other amnesia: Secondary | ICD-10-CM

## 2021-08-05 NOTE — Patient Instructions (Addendum)
It was a pleasure to see you today at our office.   Recommendations:  Meds:  Follow up in  6 months Continue donepezil 10 mg daily.  Continue Memantine 10 mg twice daily.  Continue other medications Referral to speech therapy Increase your activities    RECOMMENDATIONS FOR ALL PATIENTS WITH MEMORY PROBLEMS: 1. Continue to exercise (Recommend 30 minutes of walking everyday, or 3 hours every week) 2. Increase social interactions - continue going to [place] and enjoy social gatherings with friends and family 3. Eat healthy, avoid fried foods and eat more fruits and vegetables 4. Maintain adequate blood pressure, blood sugar, and blood cholesterol level. Reducing the risk of stroke and cardiovascular disease also helps promoting better memory. 5. Avoid stressful situations. Live a simple life and avoid aggravations. Organize your time and prepare for the next day in anticipation. 6. Sleep well, avoid any interruptions of sleep and avoid any distractions in the bedroom that may interfere with adequate sleep quality 7. Avoid sugar, avoid sweets as there is a strong link between excessive sugar intake, diabetes, and cognitive impairment We discussed the Mediterranean diet, which has been shown to help patients reduce the risk of progressive memory disorders and reduces cardiovascular risk. This includes eating fish, eat fruits and green leafy vegetables, nuts like almonds and hazelnuts, walnuts, and also use olive oil. Avoid fast foods and fried foods as much as possible. Avoid sweets and sugar as sugar use has been linked to worsening of memory function.  There is always a concern of gradual progression of memory problems. If this is the case, then we may need to adjust level of care according to patient needs. Support, both to the patient and caregiver, should then be put into place.    The Alzheimer's Association is here all day, every day for people facing Alzheimer's disease through our free  24/7 Helpline: 862-622-2607. The Helpline provides reliable information and support to all those who need assistance, such as individuals living with memory loss, Alzheimer's or other dementia, caregivers, health care professionals and the public.  Our highly trained and knowledgeable staff can help you with: Understanding memory loss, dementia and Alzheimer's  Medications and other treatment options  General information about aging and brain health  Skills to provide quality care and to find the best care from professionals  Legal, financial and living-arrangement decisions Our Helpline also features: Confidential care consultation provided by master's level clinicians who can help with decision-making support, crisis assistance and education on issues families face every day  Help in a caller's preferred language using our translation service that features more than 200 languages and dialects  Referrals to local community programs, services and ongoing support     FALL PRECAUTIONS: Be cautious when walking. Scan the area for obstacles that may increase the risk of trips and falls. When getting up in the mornings, sit up at the edge of the bed for a few minutes before getting out of bed. Consider elevating the bed at the head end to avoid drop of blood pressure when getting up. Walk always in a well-lit room (use night lights in the walls). Avoid area rugs or power cords from appliances in the middle of the walkways. Use a walker or a cane if necessary and consider physical therapy for balance exercise. Get your eyesight checked regularly.  FINANCIAL OVERSIGHT: Supervision, especially oversight when making financial decisions or transactions is also recommended.  HOME SAFETY: Consider the safety of the kitchen when operating appliances like  stoves, microwave oven, and blender. Consider having supervision and share cooking responsibilities until no longer able to participate in those. Accidents  with firearms and other hazards in the house should be identified and addressed as well.   ABILITY TO BE LEFT ALONE: If patient is unable to contact 911 operator, consider using LifeLine, or when the need is there, arrange for someone to stay with patients. Smoking is a fire hazard, consider supervision or cessation. Risk of wandering should be assessed by caregiver and if detected at any point, supervision and safe proof recommendations should be instituted.  MEDICATION SUPERVISION: Inability to self-administer medication needs to be constantly addressed. Implement a mechanism to ensure safe administration of the medications.       Mediterranean Diet A Mediterranean diet refers to food and lifestyle choices that are based on the traditions of countries located on the [employer]. This way of eating has been shown to help prevent certain conditions and improve outcomes for people who have chronic diseases, like kidney disease and heart disease. What are tips for following this plan? Lifestyle  Cook and eat meals together with your family, when possible. Drink enough fluid to keep your urine clear or pale yellow. Be physically active every day. This includes: Aerobic exercise like running or swimming. Leisure activities like gardening, walking, or housework. Get 7-8 hours of sleep each night. If recommended by your health care provider, drink red wine in moderation. This means 1 glass a day for nonpregnant women and 2 glasses a day for men. A glass of wine equals 5 oz (150 mL). Reading food labels  Check the serving size of packaged foods. For foods such as rice and pasta, the serving size refers to the amount of cooked product, not dry. Check the total fat in packaged foods. Avoid foods that have saturated fat or trans fats. Check the ingredients list for added sugars, such as corn syrup. Shopping  At the grocery store, buy most of your food from the areas near the walls of the  store. This includes: Fresh fruits and vegetables (produce). Grains, beans, nuts, and seeds. Some of these may be available in unpackaged forms or large amounts (in bulk). Fresh seafood. Poultry and eggs. Low-fat dairy products. Buy whole ingredients instead of prepackaged foods. Buy fresh fruits and vegetables in-season from local farmers markets. Buy frozen fruits and vegetables in resealable bags. If you do not have access to quality fresh seafood, buy precooked frozen shrimp or canned fish, such as tuna, salmon, or sardines. Buy small amounts of raw or cooked vegetables, salads, or olives from the deli or salad bar at your store. Stock your pantry so you always have certain foods on hand, such as olive oil, canned tuna, canned tomatoes, rice, pasta, and beans. Cooking  Cook foods with extra-virgin olive oil instead of using butter or other vegetable oils. Have meat as a side dish, and have vegetables or grains as your main dish. This means having meat in small portions or adding small amounts of meat to foods like pasta or stew. Use beans or vegetables instead of meat in common dishes like chili or lasagna. Experiment with different cooking methods. Try roasting or broiling vegetables instead of steaming or sauteing them. Add frozen vegetables to soups, stews, pasta, or rice. Add nuts or seeds for added healthy fat at each meal. You can add these to yogurt, salads, or vegetable dishes. Marinate fish or vegetables using olive oil, lemon juice, garlic, and fresh herbs. Meal planning  Plan to eat 1 vegetarian meal one day each week. Try to work up to 2 vegetarian meals, if possible. Eat seafood 2 or more times a week. Have healthy snacks readily available, such as: Vegetable sticks with hummus. Greek yogurt. Fruit and nut trail mix. Eat balanced meals throughout the week. This includes: Fruit: 2-3 servings a day Vegetables: 4-5 servings a day Low-fat dairy: 2 servings a day Fish,  poultry, or lean meat: 1 serving a day Beans and legumes: 2 or more servings a week Nuts and seeds: 1-2 servings a day Whole grains: 6-8 servings a day Extra-virgin olive oil: 3-4 servings a day Limit red meat and sweets to only a few servings a month What are my food choices? Mediterranean diet Recommended Grains: Whole-grain pasta. Brown rice. Bulgar wheat. Polenta. Couscous. Whole-wheat bread. Orpah Cobb. Vegetables: Artichokes. Beets. Broccoli. Cabbage. Carrots. Eggplant. Green beans. Chard. Kale. Spinach. Onions. Leeks. Peas. Squash. Tomatoes. Peppers. Radishes. Fruits: Apples. Apricots. Avocado. Berries. Bananas. Cherries. Dates. Figs. Grapes. Lemons. Melon. Oranges. Peaches. Plums. Pomegranate. Meats and other protein foods: Beans. Almonds. Sunflower seeds. Pine nuts. Peanuts. Cod. Salmon. Scallops. Shrimp. Tuna. Tilapia. Clams. Oysters. Eggs. Dairy: Low-fat milk. Cheese. Greek yogurt. Beverages: Water. Red wine. Herbal tea. Fats and oils: Extra virgin olive oil. Avocado oil. Grape seed oil. Sweets and desserts: [place] yogurt with honey. Baked apples. Poached pears. Trail mix. Seasoning and other foods: Basil. Cilantro. Coriander. Cumin. Mint. Parsley. Sage. Rosemary. Tarragon. Garlic. Oregano. Thyme. Pepper. Balsalmic vinegar. Tahini. Hummus. Tomato sauce. Olives. Mushrooms. Limit these Grains: Prepackaged pasta or rice dishes. Prepackaged cereal with added sugar. Vegetables: Deep fried potatoes (french fries). Fruits: Fruit canned in syrup. Meats and other protein foods: Beef. Pork. Lamb. Poultry with skin. Hot dogs. Tomasa Blase. Dairy: Ice cream. Sour cream. Whole milk. Beverages: Juice. Sugar-sweetened soft drinks. Beer. Liquor and spirits. Fats and oils: Butter. Canola oil. Vegetable oil. Beef fat (tallow). Lard. Sweets and desserts: Cookies. Cakes. Pies. Candy. Seasoning and other foods: Mayonnaise. Premade sauces and marinades. The items listed may not be a complete list. Talk  with your dietitian about what dietary choices are right for you. Summary The Mediterranean diet includes both food and lifestyle choices. Eat a variety of fresh fruits and vegetables, beans, nuts, seeds, and whole grains. Limit the amount of red meat and sweets that you eat. Talk with your health care provider about whether it is safe for you to drink red wine in moderation. This means 1 glass a day for nonpregnant women and 2 glasses a day for men. A glass of wine equals 5 oz (150 mL). This information is not intended to replace advice given to you by your health care provider. Make sure you discuss any questions you have with your health care provider. Document Released: 06/02/2016 Document Revised: 07/05/2016 Document Reviewed: 06/02/2016 Elsevier Interactive Patient Education  2017 [employer].
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
## 2021-09-29 ENCOUNTER — Encounter: Payer: Medicare PPO | Admitting: Family Medicine

## 2021-10-06 ENCOUNTER — Other Ambulatory Visit: Payer: Self-pay | Admitting: Neurology

## 2021-10-27 ENCOUNTER — Telehealth: Payer: Self-pay | Admitting: Family Medicine

## 2021-11-11 DIAGNOSIS — S39012A Strain of muscle, fascia and tendon of lower back, initial encounter: Secondary | ICD-10-CM | POA: Diagnosis not present

## 2021-11-11 DIAGNOSIS — X500XXA Overexertion from strenuous movement or load, initial encounter: Secondary | ICD-10-CM | POA: Diagnosis not present

## 2021-11-22 ENCOUNTER — Encounter: Payer: Medicare PPO | Admitting: Family Medicine

## 2021-12-17 ENCOUNTER — Encounter: Payer: Self-pay | Admitting: Family Medicine

## 2021-12-17 ENCOUNTER — Ambulatory Visit (INDEPENDENT_AMBULATORY_CARE_PROVIDER_SITE_OTHER): Payer: Medicare PPO | Admitting: Family Medicine

## 2021-12-17 VITALS — BP 128/80 | HR 70 | Temp 97.5°F | Ht 62.0 in | Wt 137.5 lb

## 2021-12-17 DIAGNOSIS — E785 Hyperlipidemia, unspecified: Secondary | ICD-10-CM

## 2021-12-17 DIAGNOSIS — G301 Alzheimer's disease with late onset: Secondary | ICD-10-CM | POA: Diagnosis not present

## 2021-12-17 DIAGNOSIS — E559 Vitamin D deficiency, unspecified: Secondary | ICD-10-CM | POA: Diagnosis not present

## 2021-12-17 DIAGNOSIS — Z23 Encounter for immunization: Secondary | ICD-10-CM | POA: Diagnosis not present

## 2021-12-17 DIAGNOSIS — Z0000 Encounter for general adult medical examination without abnormal findings: Secondary | ICD-10-CM

## 2021-12-17 DIAGNOSIS — F02818 Dementia in other diseases classified elsewhere, unspecified severity, with other behavioral disturbance: Secondary | ICD-10-CM | POA: Diagnosis not present

## 2021-12-17 DIAGNOSIS — I10 Essential (primary) hypertension: Secondary | ICD-10-CM

## 2021-12-17 DIAGNOSIS — E538 Deficiency of other specified B group vitamins: Secondary | ICD-10-CM

## 2021-12-17 DIAGNOSIS — F32A Depression, unspecified: Secondary | ICD-10-CM

## 2021-12-17 LAB — COMPREHENSIVE METABOLIC PANEL
ALT: 11 U/L (ref 0–35)
AST: 19 U/L (ref 0–37)
Albumin: 4.3 g/dL (ref 3.5–5.2)
Alkaline Phosphatase: 67 U/L (ref 39–117)
BUN: 10 mg/dL (ref 6–23)
CO2: 28 mEq/L (ref 19–32)
Calcium: 9.9 mg/dL (ref 8.4–10.5)
Chloride: 103 mEq/L (ref 96–112)
Creatinine, Ser: 0.94 mg/dL (ref 0.40–1.20)
GFR: 61.56 mL/min (ref >60.00)
Glucose, Bld: 94 mg/dL (ref 70–99)
Potassium: 3.6 mEq/L (ref 3.5–5.1)
Sodium: 138 mEq/L (ref 135–145)
Total Bilirubin: 0.9 mg/dL (ref 0.2–1.2)
Total Protein: 7.9 g/dL (ref 6.0–8.3)

## 2021-12-17 LAB — CBC WITH DIFFERENTIAL/PLATELET
Basophils Absolute: 0.1 10*3/uL (ref 0.0–0.1)
Basophils Relative: 0.9 % (ref 0.0–3.0)
Eosinophils Absolute: 0 10*3/uL (ref 0.0–0.7)
Eosinophils Relative: 0.8 % (ref 0.0–5.0)
HCT: 41.2 % (ref 36.0–46.0)
Hemoglobin: 14 g/dL (ref 12.0–15.0)
Lymphocytes Relative: 36.5 % (ref 12.0–46.0)
Lymphs Abs: 2.1 10*3/uL (ref 0.7–4.0)
MCHC: 33.9 g/dL (ref 30.0–36.0)
MCV: 93.2 fl (ref 78.0–100.0)
Monocytes Absolute: 0.4 10*3/uL (ref 0.1–1.0)
Monocytes Relative: 7.6 % (ref 3.0–12.0)
Neutro Abs: 3.1 10*3/uL (ref 1.4–7.7)
Neutrophils Relative %: 54.2 % (ref 43.0–77.0)
Platelets: 311 10*3/uL (ref 150.0–400.0)
RBC: 4.42 Mil/uL (ref 3.87–5.11)
RDW: 12.5 % (ref 11.5–15.5)
WBC: 5.7 10*3/uL (ref 4.0–10.5)

## 2021-12-17 LAB — VITAMIN D 25 HYDROXY (VIT D DEFICIENCY, FRACTURES): VITD: 24.78 ng/mL — ABNORMAL LOW (ref 30.00–100.00)

## 2021-12-17 LAB — VITAMIN B12: Vitamin B-12: 272 pg/mL (ref 211–911)

## 2021-12-17 NOTE — Patient Instructions (Addendum)
°  [place] resources from [employer]" (gathering place for friends with dementia and caregivers to have social time, coffee, treats) to exercise classes. All free!

## 2021-12-17 NOTE — Progress Notes (Signed)
[name] DOB: December 02, 1950 Encounter date: 12/17/2021  This is a 71 y.o. female who presents for complete physical   History of present illness/Additional concerns: Last visit with me was 03/17/21.  Saw sara wertman in neurology on 08/05/21 with plans to continue donepezil 10mg  daily and memantine 10mg  BID for alzheimer's disease.  Here with niece today. Patient living with sister; niece states that is getting harder. [name] wants to be at her own house, but sister doesn't want to move to patient's house. [name] can't stay by herself. Sister doesn't want to bring in help, but amount of care needed is increasing. Both niece, sister want [name] to be happy, but hard to know what to do. Niece states that motor skills are decreasing. [name] not able to do some of things she did easily in the past. Frustration builds a little; [name] doesn't want to be burden. Pretty much [name] with sister most of the time.   Niece states that she feels [name] is just frustrated. Can't do the things that she used to do. Previously simple tasks like putting on socks and shoes she can no longer do. [name] is not doing any AIDLs and struggles with self care, like getting dressed.   [name] has had some agitation. [name] states that she sleeps well, but her sister has reported that she is frequently up at night.  [name] denies seeing or hearing things that are not there, but sister has reported that [name] has occasionally seen or heard something that is not there.   [name] denies any pain.  Appetite seems good.  Past Medical History:  Diagnosis Date   Acute sialoadenitis 02/28/2011   B submand    Altered consciousness 02/17/2017   4/18  transient impairment of awareness ?etiology x 10 min ASA qd Dr Tomi Likens   Alzheimer disease [facility])    CONTACT DERMATITIS&OTHER ECZEMA DUE TO PLANTS 02/13/2008   Qualifier: Diagnosis of  By: Ronnald Ramp MD, Arvid Right.    Depression    Hyperlipidemia    LBP (low back pain)    NONSPECIFIC ABNORMAL RESULTS LIVR  FUNCTION STUDY 11/13/2007   Qualifier: Diagnosis of  By: Plotnikov MD, Evie Lacks    OA (osteoarthritis)    Paronychia of great toe of left foot 01/21/2013   3/14 The toenail is going to come off - likely s/p trauma 1 mo ago    Pneumonia due to organism 05/05/2014   7/15 R (clinical dx) 9/16 RML (CT)    Vitamin B 12 deficiency    Past Surgical History:  Procedure Laterality Date   CHOLECYSTECTOMY  1990   Allergies  Allergen Reactions   Erythromycin Base Nausea Only   Other Nausea And Vomiting and Other (See Comments)    Mycins-dizziness   Current Meds  Medication Sig   Cholecalciferol 1000 UNITS capsule Take 1,000 Units by mouth daily.   citalopram (CELEXA) 20 MG tablet Take 1 tablet (20 mg total) by mouth daily.   donepezil (ARICEPT) 10 MG tablet TAKE 1 TABLET BY MOUTH EVERYDAY AT BEDTIME   memantine (NAMENDA) 10 MG tablet TAKE 1 TABLET BY MOUTH EVERY DAY   [DISCONTINUED] acetaminophen (TYLENOL) 500 MG tablet Take 500 mg by mouth every 6 (six) hours as needed.   [DISCONTINUED] aspirin 81 MG tablet Take 81 mg by mouth daily.   [DISCONTINUED] Cyanocobalamin (B-12 PO) Take by mouth daily.   [DISCONTINUED] Vitamin D, Ergocalciferol, (DRISDOL) 1.25 MG (50000 UNIT) CAPS capsule Take 1 capsule (50,000 Units total) by mouth every 7 (seven)  days.   Social History   Tobacco Use   Smoking status: Never   Smokeless tobacco: Never  Substance Use Topics   Alcohol use: No   Family History  Problem Relation Age of Onset   Pneumonia Father    Emphysema Father        smoker   Diabetes Mother    Heart disease Mother    Hypertension Mother    Pancreatic cancer Mother    Liver cancer Mother    Diabetes Other    Hypertension Other    Colon cancer Maternal Grandmother 17   Other Brother        died in infancy   Pneumonia Brother 65       was smoker     Review of Systems  Constitutional:  Negative for activity change, appetite change, chills, fatigue, fever and unexpected weight  change.  HENT:  Negative for congestion, ear pain, hearing loss, sinus pressure, sinus pain, sore throat and trouble swallowing.   Eyes:  Negative for pain and visual disturbance.  Respiratory:  Negative for cough, chest tightness, shortness of breath and wheezing.   Cardiovascular:  Negative for chest pain, palpitations and leg swelling.  Gastrointestinal:  Negative for abdominal pain, blood in stool, constipation, diarrhea, nausea and vomiting.  Genitourinary:  Negative for difficulty urinating and menstrual problem.  Musculoskeletal:  Negative for arthralgias and back pain.  Skin:  Negative for rash.  Neurological:  Negative for dizziness, weakness, numbness and headaches.  Hematological:  Negative for adenopathy. Does not bruise/bleed easily.  Psychiatric/Behavioral:  Positive for agitation, confusion, decreased concentration and sleep disturbance. Negative for suicidal ideas. The patient is not nervous/anxious.    CBC:  Lab Results  Component Value Date   WBC 5.7 12/17/2021   HGB 14.0 12/17/2021   HCT 41.2 12/17/2021   MCH 31.6 04/12/2020   MCHC 33.9 12/17/2021   RDW 12.5 12/17/2021   PLT 311.0 12/17/2021   CMP: Lab Results  Component Value Date   NA 138 12/17/2021   K 3.6 12/17/2021   CL 103 12/17/2021   CO2 28 12/17/2021   ANIONGAP 10 04/12/2020   GLUCOSE 94 12/17/2021   GLUCOSE 101 (H) 09/20/2006   BUN 10 12/17/2021   CREATININE 0.94 12/17/2021   GFRAA >60 04/12/2020   CALCIUM 9.9 12/17/2021   PROT 7.9 12/17/2021   BILITOT 0.9 12/17/2021   ALKPHOS 67 12/17/2021   ALT 11 12/17/2021   AST 19 12/17/2021   LIPID: Lab Results  Component Value Date   CHOL 210 (H) 03/17/2021   TRIG 97.0 03/17/2021   TRIG 72 09/20/2006   HDL 52.00 03/17/2021   LDLCALC 139 (H) 03/17/2021    Objective:  BP 128/80 (BP Location: Left Arm, Patient Position: Sitting, Cuff Size: Normal)    Pulse 70    Temp (!) 97.5 F (36.4 C) (Oral)    Ht 5\' 2"  (1.575 m)    Wt 137 lb 8 oz (62.4 kg)     SpO2 98%    BMI 25.15 kg/m   Weight: 137 lb 8 oz (62.4 kg)   BP Readings from Last 3 Encounters:  12/17/21 128/80  08/05/21 130/70  03/17/21 118/80   Wt Readings from Last 3 Encounters:  12/17/21 137 lb 8 oz (62.4 kg)  08/05/21 138 lb (62.6 kg)  03/17/21 136 lb 12.8 oz (62.1 kg)    Physical Exam Constitutional:      General: She is not in acute distress.    Appearance: She is  well-developed.  HENT:     Head: Normocephalic and atraumatic.     Right Ear: External ear normal.     Left Ear: External ear normal.     Mouth/Throat:     Pharynx: No oropharyngeal exudate.  Eyes:     Conjunctiva/sclera: Conjunctivae normal.     Pupils: Pupils are equal, round, and reactive to light.  Neck:     Thyroid: No thyromegaly.  Cardiovascular:     Rate and Rhythm: Normal rate and regular rhythm.     Heart sounds: Normal heart sounds. No murmur heard.   No friction rub. No gallop.  Pulmonary:     Effort: Pulmonary effort is normal.     Breath sounds: Normal breath sounds.  Abdominal:     General: Bowel sounds are normal. There is no distension.     Palpations: Abdomen is soft. There is no mass.     Tenderness: There is no abdominal tenderness. There is no guarding.     Hernia: No hernia is present.  Musculoskeletal:        General: No tenderness or deformity. Normal range of motion.     Cervical back: Normal range of motion and neck supple.  Lymphadenopathy:     Cervical: No cervical adenopathy.  Skin:    General: Skin is warm and dry.     Findings: No rash.  Neurological:     Mental Status: She is alert and oriented to person, place, and time.     Cranial Nerves: Cranial nerves 2-12 are intact.     Motor: No pronator drift.     Gait: Abnormal gait: gait is slow but otherwise normal.     Deep Tendon Reflexes: Reflexes normal.     Reflex Scores:      Tricep reflexes are 2+ on the right side and 2+ on the left side.      Bicep reflexes are 2+ on the right side and 2+ on the left  side.      Brachioradialis reflexes are 2+ on the right side and 2+ on the left side.      Patellar reflexes are 2+ on the right side and 2+ on the left side.    Comments: Difficulty with getting up on to bed, but seems related to confusion about how to place feet on step and get self turned around to sit on bed.   Difficult time following commands, even simple (ie close eyes with balance evaluation and she will not close eyes or keep them closed)  Psychiatric:        Speech: Speech normal.        Behavior: Behavior normal.        Thought Content: Thought content normal.    Assessment/Plan: Health Maintenance Due  Topic Date Due   INFLUENZA VACCINE  05/24/2021   Health Maintenance reviewed. Due to progression of dementia, I would recommend cessation of cancer screenings.  1. Preventative health care Encouraged regular activity/exercise with sister. I think that group programs would be helpful for both patient and her caregiver. Will consult social work as well to work with them and see what insurance might cover/what options there are.  - Pneumococcal conjugate vaccine 20-valent (Prevnar 20)  2. Essential hypertension Bp well controlled.  Not currently on medications.  Advised stopping aspirin since this is really present more risk of side effects limit of benefit. - CBC with Differential/Platelet; Future - Comprehensive metabolic panel; Future - Comprehensive metabolic panel - CBC with Differential/Platelet  3. Late onset Alzheimer's disease with behavioral disturbance [facility]) Following with neurology.  She continues on donepezil and memantine.  Uncertain benefit that these are providing for her.  I do not feel that she can live independently.  It does sound like her sister has some caregiver burden, and some resources to help relieve this would be helpful. - AMB Referral to [place]  4. Chronic depression Overall patient states that her mood is okay.  I think  there is more frustration with her recognition of loss of independence rather than sad or anxious mood.  She does continue to take the citalopram 20 mg.  I encouraged the patient's niece to make sure they were tracking patient's agitation and concerns with sleep so that this could be addressed at her follow-up neurology visit.  5. Vitamin D deficiency - VITAMIN D 25 Hydroxy (Vit-D Deficiency, Fractures); Future - VITAMIN D 25 Hydroxy (Vit-D Deficiency, Fractures)  6. Dyslipidemia Has been diet controlled.  Due to advancing dementia I do not feel that further recheck or treatment of cholesterol is necessary.  7. B12 deficiency - Vitamin B12; Future - Vitamin B12   Return for pending lab or imaging results.  Micheline Rough, MD

## 2021-12-20 ENCOUNTER — Telehealth: Payer: Self-pay | Admitting: *Deleted

## 2021-12-20 NOTE — Chronic Care Management (AMB) (Signed)
°  Care Management   Note  12/20/2021 Name: [name] MRN: [id number] DOB: Dec 10, 1950  [name] is a 71 y.o. year old female who is a primary care patient of Koberlein, Steele Berg, MD. I reached out to [name] by phone today in response to a referral sent by Ms. Deberah Castle Dowers's primary care provider.   Ms. Wuethrich was given information about care management services today including:  Care management services include personalized support from designated clinical staff supervised by her physician, including individualized plan of care and coordination with other care providers 24/7 contact phone numbers for assistance for urgent and routine care needs. The patient may stop care management services at any time by phone call to the office staff.  Patient agreed to services and verbal consent obtained.   Follow up plan: Telephone appointment with care management team member scheduled for:12/24/21  [facility]  Direct Dial: [phone]

## 2021-12-24 ENCOUNTER — Ambulatory Visit (INDEPENDENT_AMBULATORY_CARE_PROVIDER_SITE_OTHER): Payer: Medicare PPO | Admitting: Licensed Clinical Social Worker

## 2021-12-24 DIAGNOSIS — F02818 Dementia in other diseases classified elsewhere, unspecified severity, with other behavioral disturbance: Secondary | ICD-10-CM

## 2021-12-24 DIAGNOSIS — F32A Depression, unspecified: Secondary | ICD-10-CM

## 2021-12-24 DIAGNOSIS — I10 Essential (primary) hypertension: Secondary | ICD-10-CM

## 2021-12-30 NOTE — Chronic Care Management (AMB) (Signed)
?Chronic Care Management  ? ? Clinical Social Work Note ? ?12/30/2021 ?Name: [name] MRN: [id number] DOB: 05-01-1951 ? ?[name] is a 71 y.o. year old female who is a primary care patient of Koberlein, Steele Berg, MD. The CCM team was consulted to assist the patient with chronic disease management and/or care coordination needs related to: Mental Health Counseling and Resources and Caregiver Stress.  ? ?Engaged with patient's daughter by telephone for initial visit in response to provider referral for social work chronic care management and care coordination services.  ? ?Consent to Services:  ?The patient was given the following information about Chronic Care Management services today, agreed to services, and gave verbal consent: 1. CCM service includes personalized support from designated clinical staff supervised by the primary care provider, including individualized plan of care and coordination with other care providers 2. 24/7 contact phone numbers for assistance for urgent and routine care needs. 3. Service will only be billed when office clinical staff spend 20 minutes or more in a month to coordinate care. 4. Only one practitioner may furnish and bill the service in a calendar month. 5.The patient may stop CCM services at any time (effective at the end of the month) by phone call to the office staff. 6. The patient will be responsible for cost sharing (co-pay) of up to 20% of the service fee (after annual deductible is met). Patient agreed to services and consent obtained. ? ?Patient agreed to services and consent obtained.  ? ?Assessment: Review of patient past medical history, allergies, medications, and health status, including review of relevant consultants reports was performed today as part of a comprehensive evaluation and provision of chronic care management and care coordination services.    ? ?SDOH (Social Determinants of Health) assessments and interventions performed:   ? ?Advanced  Directives Status: Not addressed in this encounter. ? ?CCM Care Plan ? ?Allergies  ?Allergen Reactions  ? Erythromycin Base Nausea Only  ? Other Nausea And Vomiting and Other (See Comments)  ?  Mycins-dizziness  ? ? ?Outpatient Encounter Medications as of 12/24/2021  ?Medication Sig  ? Cholecalciferol 1000 UNITS capsule Take 1,000 Units by mouth daily.  ? citalopram (CELEXA) 20 MG tablet Take 1 tablet (20 mg total) by mouth daily.  ? donepezil (ARICEPT) 10 MG tablet TAKE 1 TABLET BY MOUTH EVERYDAY AT BEDTIME  ? memantine (NAMENDA) 10 MG tablet TAKE 1 TABLET BY MOUTH EVERY DAY  ? ?No facility-administered encounter medications on file as of 12/24/2021.  ? ? ?Patient Active Problem List  ? Diagnosis Date Noted  ? Late onset Alzheimer's disease with behavioral disturbance ([place]) 08/05/2021  ? Memory problem 06/12/2019  ? Alopecia areata 05/30/2014  ? Tendinopathy of left biceps tendon 10/04/2013  ? Trigger finger, acquired 10/04/2013  ? Left shoulder pain 09/03/2013  ? 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  ? Onychomycosis of toenail 01/22/2013  ? IBS (irritable bowel syndrome) 09/09/2011  ? ABDOMINAL PAIN, LOWER 02/02/2009  ? Essential hypertension 09/26/2008  ? B12 deficiency 11/13/2007  ? Dyslipidemia 11/13/2007  ? LOW BACK PAIN 11/13/2007  ? Chronic depression 07/14/2007  ? OSTEOARTHRITIS 07/14/2007  ? ? ?Conditions to be addressed/monitored: HTN, Depression, Dementia, and Osteoporosis; Inability to perform ADL's independently and Inability to perform IADL's independently ? ?Care Plan : LCSW Plan of Care  ?Updates made by Rebekah Chesterfield, LCSW since 12/30/2021 12:00 AM  ?  ? ?Problem: Caregiver Stress   ?  ? ?Long-Range Goal: Caregiver Coping Optimized   ?  Start Date: 12/24/2021  ?This Visit's Progress: On track  ?Priority: High  ?Note:   ?Current barriers:   ? Memory Deficits, Inability to perform ADL's independently, and Inability to perform IADL's independently ?Clinical Goals: Patient will work with CCM LCSW to  address needs related to management of physical and mental health conditions ?Clinical Interventions:  ?Recognize and validate the complex nature of dementia, as well as the impact on the caregiver and extended family; provide education and support to align with needs and stage of dementia.  ?Discuss services that may help balance the caregiving role and living the life he/she chooses such as professionally or peer-led support groups, web-based support, counseling and services such as respite care or in-home help.  ?Encourage use of individualized coping strategies that may include yoga, spiritual support, distraction, exercise, relaxation, music, massage, music therapy and outdoor activities to utilize nature's restorative properties.   ?Provide proactive acknowledgement of potential for depressive symptoms to appear; normalize response to burden of caregiving; refer to primary care provider or for mental health services.  ?Solution-Focused Strategies employed:  ?Active listening / Reflection utilized  ?Emotional Support Provided ?Provided psychoeducation for mental health needs  ?Caregiver stress acknowledged  ?Verbalization of feelings encouraged  ?Discussed caregiver resources and support: Resources provided via e-mail ?Review various resources, discussed options and provided patient information  ?1:1 collaboration with primary care provider regarding development and update of comprehensive plan of care as evidenced by provider attestation and co-signature ?Inter-disciplinary care team collaboration (see longitudinal plan of care) ?Patient Goals/Self-Care Activities: Over the next 120 days ?Attend all scheduled medical appointments ?Utilize healthy coping skills and/or supportive resources discussed ?Contact PCP office with any questions or concerns ?  ? ?Task: Recognize and Manage Caregiver Stress   ?  ?  ? ?Christa See, MSW, LCSW ?[facility] ?[place]  [facility] ?[email or web address] ?Phone [phone] ?12:06 PM ? ? ? ?

## 2021-12-30 NOTE — Patient Instructions (Signed)
Visit Information ? ?Thank you for taking time to visit with me today. Please don't hesitate to contact me if I can be of assistance to you before our next scheduled telephone appointment. ? ?Following are the goals we discussed today:  ?Patient Goals/Self-Allison Activities: Over the next 120 days ?Attend all scheduled medical appointments ?Utilize healthy coping skills and/or supportive resources discussed ?Contact PCP office with any questions or concerns ? ?Our next appointment is by telephone on 02/11/22 at 10:00 AM ? ?Please call the Allison guide team at [phone] if you need to cancel or reschedule your appointment.  ? ?If you are experiencing a Mental Health or Behavioral Health Crisis or need someone to talk to, please call the Suicide and Crisis Lifeline: 988 ?call the [place] National Suicide Prevention Lifeline: 765-185-0910 or TTY: 631-185-9064 TTY (725)785-9779) to talk to a trained counselor ?call 911  ? ?Following is a copy of your full plan of Allison:  ?Allison Plan : LCSW Plan of Allison  ?Updates made by Allison Larsson, LCSW since 12/30/2021 12:00 AM  ?  ? ?Problem: Caregiver Stress   ?  ? ?Long-Range Goal: Caregiver Coping Optimized   ?Start Date: 12/24/2021  ?This Visit's Progress: On track  ?Priority: High  ?Note:   ?Current barriers:   ? Memory Deficits, Inability to perform ADL's independently, and Inability to perform IADL's independently ?Clinical Goals: Patient will work with CCM LCSW to address needs related to [name] of physical and mental health conditions ?Clinical Interventions:  ?Recognize and validate the complex nature of dementia, as well as the impact on the caregiver and extended family; provide education and support to align with needs and stage of dementia.  ?Discuss services that may help balance the caregiving role and living the life he/she chooses such as professionally or peer-led support groups, web-based support, counseling and services such as respite Allison or in-home help.   ?Encourage use of individualized coping strategies that may include yoga, spiritual support, distraction, exercise, relaxation, music, massage, music therapy and outdoor activities to utilize nature's restorative properties.   ?Provide proactive acknowledgement of potential for depressive symptoms to appear; normalize response to burden of caregiving; refer to primary Allison provider or for mental health services.  ?Solution-Focused Strategies employed:  ?Active listening / Reflection utilized  ?Emotional Support Provided ?Provided psychoeducation for mental health needs  ?Caregiver stress acknowledged  ?Verbalization of feelings encouraged  ?Discussed caregiver resources and support: Resources provided via e-mail ?Review various resources, discussed options and provided patient information  ?1:1 collaboration with primary Allison provider regarding development and update of comprehensive plan of Allison as evidenced by provider attestation and co-signature ?Inter-disciplinary Allison team collaboration (see longitudinal plan of Allison) ?Patient Goals/Self-Allison Activities: Over the next 120 days ?Attend all scheduled medical appointments ?Utilize healthy coping skills and/or supportive resources discussed ?Contact PCP office with any questions or concerns ?  ? ?Task: Recognize and Manage Caregiver Stress   ?  ? ? ?[name] was given information about [name] services by the embedded Allison coordination team including:  ?[name] services include personalized support from designated clinical staff supervised by her physician, including individualized plan of Allison and coordination with other Allison providers ?24/7 contact phone numbers for assistance for urgent and routine Allison needs. ?The patient may stop CCM services at any time (effective at the end of the month) by phone call to the office staff. ? ?Patient agreed to services and verbal consent obtained.  ? ?Patient verbalizes understanding of instructions and  Allison  plan provided today and agrees to view in MyChart. Active MyChart status confirmed with patient.   ? ?[name], MSW, LCSW ?[name] [name] ?  [facility] ?[name] ?Phone [phone] ?12:07 PM ? ? ?  ?

## 2022-02-07 ENCOUNTER — Ambulatory Visit: Payer: Medicare PPO | Admitting: Physician Assistant

## 2022-02-10 ENCOUNTER — Encounter: Payer: Self-pay | Admitting: Physician Assistant

## 2022-02-10 ENCOUNTER — Ambulatory Visit: Payer: Medicare PPO | Admitting: Physician Assistant

## 2022-02-10 VITALS — BP 144/95 | HR 65 | Resp 18 | Ht 62.0 in | Wt 142.0 lb

## 2022-02-10 DIAGNOSIS — G301 Alzheimer's disease with late onset: Secondary | ICD-10-CM

## 2022-02-10 DIAGNOSIS — R413 Other amnesia: Secondary | ICD-10-CM | POA: Diagnosis not present

## 2022-02-10 DIAGNOSIS — F02818 Dementia in other diseases classified elsewhere, unspecified severity, with other behavioral disturbance: Secondary | ICD-10-CM

## 2022-02-10 MED ORDER — SERTRALINE HCL 25 MG PO TABS: 25.0000 mg | ORAL_TABLET | Freq: Every day | ORAL | 3 refills | Status: DC

## 2022-02-10 NOTE — Patient Instructions (Addendum)
It was a pleasure to see you today at our office.  ? ?Recommendations: ? ?Meds: ? ?Follow up in  6 months ?Continue donepezil 10 mg daily.  ?Continue Memantine 10 mg twice daily.  ?Continue other medications ?Referral to speech therapy  ?Start Zoloft  25 mg daily for depression ?Discontinue Citalopram   ?Increase your activities  ? ? ?RECOMMENDATIONS FOR ALL PATIENTS WITH MEMORY PROBLEMS: ?1. Continue to exercise (Recommend 30 minutes of walking everyday, or 3 hours every week) ?2. Increase social interactions - continue going to [place] and enjoy social gatherings with friends and family ?3. Eat healthy, avoid fried foods and eat more fruits and vegetables ?4. Maintain adequate blood pressure, blood sugar, and blood cholesterol level. Reducing the risk of stroke and cardiovascular disease also helps promoting better memory. ?5. Avoid stressful situations. Live a simple life and avoid aggravations. Organize your time and prepare for the next day in anticipation. ?6. Sleep well, avoid any interruptions of sleep and avoid any distractions in the bedroom that may interfere with adequate sleep quality ?7. Avoid sugar, avoid sweets as there is a strong link between excessive sugar intake, diabetes, and cognitive impairment ?We discussed the Mediterranean diet, which has been shown to help patients reduce the risk of progressive memory disorders and reduces cardiovascular risk. This includes eating fish, eat fruits and green leafy vegetables, nuts like almonds and hazelnuts, walnuts, and also use olive oil. Avoid fast foods and fried foods as much as possible. Avoid sweets and sugar as sugar use has been linked to worsening of memory function. ? ?There is always a concern of gradual progression of memory problems. If this is the case, then we may need to adjust level of care according to patient needs. Support, both to the patient and caregiver, should then be put into place.  ? ? ?The Alzheimer?s Association is here all  day, every day for people facing Alzheimer?s disease through our free 24/7 Helpline: [phone]. The Helpline provides reliable information and support to all those who need assistance, such as individuals living with memory loss, Alzheimer's or other dementia, caregivers, health care professionals and the public.  ?Our highly trained and knowledgeable staff can help you with: ?Understanding memory loss, dementia and Alzheimer's  ?Medications and other treatment options  ?General information about aging and brain health  ?Skills to provide quality care and to find the best care from professionals  ?Legal, financial and living-arrangement decisions ? ?Our Helpline also features: ?Confidential care consultation provided by master's level clinicians who can help with decision-making support, crisis assistance and education on issues families face every day  ?Help in a caller's preferred language using our translation service that features more than 200 languages and dialects  ?Referrals to local community programs, services and ongoing support ? ? ? ? ?FALL PRECAUTIONS: Be cautious when walking. Scan the area for obstacles that may increase the risk of trips and falls. When getting up in the mornings, sit up at the edge of the bed for a few minutes before getting out of bed. Consider elevating the bed at the head end to avoid drop of blood pressure when getting up. Walk always in a well-lit room (use night lights in the walls). Avoid area rugs or power cords from appliances in the middle of the walkways. Use a walker or a cane if necessary and consider physical therapy for balance exercise. Get your eyesight checked regularly. ? ?FINANCIAL OVERSIGHT: Supervision, especially oversight when making financial decisions or transactions is also  recommended. ? ?HOME SAFETY: Consider the safety of the kitchen when operating appliances like stoves, microwave oven, and blender. Consider having supervision and share cooking  responsibilities until no longer able to participate in those. Accidents with firearms and other hazards in the house should be identified and addressed as well. ? ? ?ABILITY TO BE LEFT ALONE: If patient is unable to contact 911 operator, consider using LifeLine, or when the need is there, arrange for someone to stay with patients. Smoking is a fire hazard, consider supervision or cessation. Risk of wandering should be assessed by caregiver and if detected at any point, supervision and safe proof recommendations should be instituted. ? ?MEDICATION SUPERVISION: Inability to self-administer medication needs to be constantly addressed. Implement a mechanism to ensure safe administration of the medications. ? ? ?  ? ? ?Mediterranean Diet ?A Mediterranean diet refers to food and lifestyle choices that are based on the traditions of countries located on the [place]. This way of eating has been shown to help prevent certain conditions and improve outcomes for people who have chronic diseases, like kidney disease and heart disease. ?What are tips for following this plan? ?Lifestyle  ?Cook and eat meals together with your family, when possible. ?Drink enough fluid to keep your urine clear or pale yellow. ?Be physically active every day. This includes: ?Aerobic exercise like running or swimming. ?Leisure activities like gardening, walking, or housework. ?Get 7-8 hours of sleep each night. ?If recommended by your health care provider, drink red wine in moderation. This means 1 glass a day for nonpregnant women and 2 glasses a day for men. A glass of wine equals 5 oz (150 mL). ?Reading food labels  ?Check the serving size of packaged foods. For foods such as rice and pasta, the serving size refers to the amount of cooked product, not dry. ?Check the total fat in packaged foods. Avoid foods that have saturated fat or trans fats. ?Check the ingredients list for added sugars, such as corn syrup. ?Shopping  ?At the  grocery store, buy most of your food from the areas near the walls of the store. This includes: ?Fresh fruits and vegetables (produce). ?Grains, beans, nuts, and seeds. Some of these may be available in unpackaged forms or large amounts (in bulk). ?Fresh seafood. ?Poultry and eggs. ?Low-fat dairy products. ?Buy whole ingredients instead of prepackaged foods. ?Buy fresh fruits and vegetables in-season from local farmers markets. ?Buy frozen fruits and vegetables in resealable bags. ?If you do not have access to quality fresh seafood, buy precooked frozen shrimp or canned fish, such as tuna, salmon, or sardines. ?Buy small amounts of raw or cooked vegetables, salads, or olives from the deli or salad bar at your store. ?Stock your pantry so you always have certain foods on hand, such as olive oil, canned tuna, canned tomatoes, rice, pasta, and beans. ?Cooking  ?Cook foods with extra-virgin olive oil instead of using butter or other vegetable oils. ?Have meat as a side dish, and have vegetables or grains as your main dish. This means having meat in small portions or adding small amounts of meat to foods like pasta or stew. ?Use beans or vegetables instead of meat in common dishes like chili or lasagna. ?Experiment with different cooking methods. Try roasting or broiling vegetables instead of steaming or saut?eing them. ?Add frozen vegetables to soups, stews, pasta, or rice. ?Add nuts or seeds for added healthy fat at each meal. You can add these to yogurt, salads, or vegetable dishes. ?Marinate  fish or vegetables using olive oil, lemon juice, garlic, and fresh herbs. ?Meal planning  ?Plan to eat 1 vegetarian meal one day each week. Try to work up to 2 vegetarian meals, if possible. ?Eat seafood 2 or more times a week. ?Have healthy snacks readily available, such as: ?Vegetable sticks with hummus. ?[place] yogurt. ?Fruit and nut trail mix. ?Eat balanced meals throughout the week. This includes: ?Fruit: 2-3 servings a  day ?Vegetables: 4-5 servings a day ?Low-fat dairy: 2 servings a day ?Fish, poultry, or lean meat: 1 serving a day ?Beans and legumes: 2 or more servings a week ?Nuts and seeds: 1-2 servings a day ?Whole grains: 6-8
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
## 2022-02-11 ENCOUNTER — Telehealth: Payer: Medicare PPO

## 2022-03-04 ENCOUNTER — Other Ambulatory Visit: Payer: Self-pay | Admitting: Physician Assistant

## 2022-03-15 ENCOUNTER — Ambulatory Visit: Payer: Medicare PPO | Admitting: Family

## 2022-03-15 VITALS — BP 110/58 | HR 76 | Temp 98.1°F | Wt 138.4 lb

## 2022-03-15 DIAGNOSIS — G301 Alzheimer's disease with late onset: Secondary | ICD-10-CM | POA: Diagnosis not present

## 2022-03-15 DIAGNOSIS — F02818 Dementia in other diseases classified elsewhere, unspecified severity, with other behavioral disturbance: Secondary | ICD-10-CM

## 2022-03-15 DIAGNOSIS — B351 Tinea unguium: Secondary | ICD-10-CM | POA: Diagnosis not present

## 2022-03-15 DIAGNOSIS — R413 Other amnesia: Secondary | ICD-10-CM

## 2022-03-18 ENCOUNTER — Other Ambulatory Visit: Payer: Medicare PPO

## 2022-03-22 NOTE — Progress Notes (Signed)
   Acute Office Visit  Subjective:     Patient ID: [name], female    DOB: [birth year], 71 y.o.   MRN: [id number]  Chief Complaint  Patient presents with  . Urinary Tract Infection    Confused more lately. Has had a few bloody noses. May have an insect bite on foot, right foot is red.     HPI Patient is in today accompanied by her caregiver with concerns of being more confused lately. She is concerned that she may have a UTI. She also admits to being confused by medication changes recently with neurology. She is currently taking Celexa and Zoloft but unsure if she was supposed to discontinue one of them. No c/o urinary frequency, urgency or burning.   Patient has concerns of thickening toenails and wants a referral to a podiatrist. No pain  Review of Systems  Genitourinary:  Negative for dysuria, frequency and urgency.  Skin:        Thickening of the toenails.  Psychiatric/Behavioral:  Positive for memory loss.        History of dememtia  All other systems reviewed and are negative.      Objective:    BP (!) 110/58 (BP Location: Right Arm, Patient Position: Sitting, Cuff Size: Normal)   Pulse 76   Temp 98.1 F (36.7 C) (Oral)   Wt 138 lb 6.4 oz (62.8 kg)   SpO2 97%   BMI 25.31 kg/m    Physical Exam Vitals and nursing note reviewed.  Constitutional:      Appearance: Normal appearance.  Cardiovascular:     Rate and Rhythm: Normal rate and regular rhythm.  Pulmonary:     Effort: Pulmonary effort is normal.     Breath sounds: Normal breath sounds.  Musculoskeletal:        General: Normal range of motion.     Cervical back: Normal range of motion and neck supple.  Skin:    General: Skin is warm and dry.     Comments: Toenails bilaterally thickened and nontender.   Neurological:     General: No focal deficit present.     Mental Status: She is alert. Mental status is at baseline.  Psychiatric:        Mood and Affect: Mood normal.        Behavior: Behavior  normal.   No results found for any visits on 03/15/22.      Assessment & Plan:   Problem List Items Addressed This Visit     Onychomycosis of toenail   Relevant Orders   Ambulatory referral to Podiatry   Late onset Alzheimer's disease with behavioral disturbance (HCC) - Primary   Relevant Orders   POC Urinalysis Dipstick   Other Visit Diagnoses     Memory changes       Relevant Orders   POC Urinalysis Dipstick      Directions clarified with Zoloft and Celexa per Neurology note. Also confirmed that she should continue Namenda and Aricept as prescribed.  Referral placed for podiatrist. Unable to obtain urine. Patient will bring urine back in the sterile cup that was provided. We will treat accordingly.  No orders of the defined types were placed in this encounter.   No follow-ups on file.  Eulis Foster, FNP

## 2022-03-30 ENCOUNTER — Telehealth: Payer: Self-pay | Admitting: Family Medicine

## 2022-03-30 NOTE — Telephone Encounter (Signed)
Left message for patient to call back and schedule Medicare Annual Wellness Visit (AWV) either virtually or in office. Left  my Zachery Conch number [phone]   Last AWV ;04/07/21  please schedule at anytime with [facility] Nurse Health Advisor 1 or 2

## 2022-04-05 ENCOUNTER — Ambulatory Visit: Payer: Medicare PPO | Admitting: Podiatry

## 2022-04-28 ENCOUNTER — Encounter: Payer: Self-pay | Admitting: Podiatry

## 2022-04-28 ENCOUNTER — Ambulatory Visit: Payer: Medicare PPO | Admitting: Podiatry

## 2022-04-28 DIAGNOSIS — M79676 Pain in unspecified toe(s): Secondary | ICD-10-CM

## 2022-04-28 DIAGNOSIS — B351 Tinea unguium: Secondary | ICD-10-CM | POA: Diagnosis not present

## 2022-04-28 NOTE — Progress Notes (Signed)
Subjective:  Patient ID: [name], female    DOB: 08/11/1951,  MRN: [id number] HPI Chief Complaint  Patient presents with   Nail Problem    Toenails bilateral - mainly hallux nails - thick and discolored, PCP wanted them checked and treated if needed   New Patient (Initial Visit)    71 y.o. female presents with the above complaint.   ROS: Denies fever chills nausea vomiting muscle aches pains calf pain back pain chest pain shortness of breath.  Past Medical History:  Diagnosis Date   Acute sialoadenitis 02/28/2011   B submand    Altered consciousness 02/17/2017   4/18  transient impairment of awareness ?etiology x 10 min ASA qd Dr Everlena Cooper   Alzheimer disease [facility])    CONTACT DERMATITIS&OTHER ECZEMA DUE TO PLANTS 02/13/2008   Qualifier: Diagnosis of  By: Yetta Barre MD, Bernadene Bell.    Depression    Hyperlipidemia    LBP (low back pain)    NONSPECIFIC ABNORMAL RESULTS LIVR FUNCTION STUDY 11/13/2007   Qualifier: Diagnosis of  By: Plotnikov MD, Georgina Quint    OA (osteoarthritis)    Paronychia of great toe of left foot 01/21/2013   3/14 The toenail is going to come off - likely s/p trauma 1 mo ago    Pneumonia due to organism 05/05/2014   7/15 R (clinical dx) 9/16 RML (CT)    Vitamin B 12 deficiency    Past Surgical History:  Procedure Laterality Date   CHOLECYSTECTOMY  1990    Current Outpatient Medications:    Cholecalciferol 1000 UNITS capsule, Take 1,000 Units by mouth daily., Disp: , Rfl:    donepezil (ARICEPT) 10 MG tablet, TAKE 1 TABLET BY MOUTH EVERYDAY AT BEDTIME (Patient not taking: Reported on 03/15/2022), Disp: 90 tablet, Rfl: 1   loratadine (CLARITIN) 10 MG tablet, Take 1 tablet (10 mg total) by mouth daily. (Patient not taking: Reported on 11/17/2020), Disp: 30 tablet, Rfl: 3   meloxicam (MOBIC) 15 MG tablet, Take 15 mg by mouth daily., Disp: , Rfl:    memantine (NAMENDA) 10 MG tablet, TAKE 1 TABLET BY MOUTH EVERY DAY, Disp: 90 tablet, Rfl: 0   sertraline (ZOLOFT) 25 MG  tablet, TAKE 1 TABLET (25 MG TOTAL) BY MOUTH DAILY., Disp: 90 tablet, Rfl: 2   tiZANidine (ZANAFLEX) 2 MG tablet, SMARTSIG:0.25-1 Tablet(s) By Mouth 1-4 Times Daily, Disp: , Rfl:   Allergies  Allergen Reactions   Erythromycin Base Nausea Only   Other Nausea And Vomiting and Other (See Comments)    Mycins-dizziness   Review of Systems Objective:  There were no vitals filed for this visit.  General: Well developed, nourished, in no acute distress, alert and oriented x3   Dermatological: Skin is warm, dry and supple bilateral. Nails x 10 are thick yellow dystrophic-like mycotic particularly the hallux nails bilaterally.  Remaining integument appears unremarkable at this time. There are no open sores, no preulcerative lesions, no rash or signs of infection present.  Vascular: Dorsalis Pedis artery and Posterior Tibial artery pedal pulses are 2/4 bilateral with immedate capillary fill time. Pedal hair growth present. No varicosities and no lower extremity edema present bilateral.   Neruologic: Grossly intact via light touch bilateral. Vibratory intact via tuning fork bilateral. Protective threshold with Semmes Wienstein monofilament intact to all pedal sites bilateral. Patellar and Achilles deep tendon reflexes 2+ bilateral. No Babinski or clonus noted bilateral.   Musculoskeletal: No gross boney pedal deformities bilateral. No pain, crepitus, or limitation noted with foot and ankle  range of motion bilateral. Muscular strength 5/5 in all groups tested bilateral.  Gait: Unassisted, Nonantalgic.    Radiographs:  None taken  Assessment & Plan:   Assessment: Nail dystrophy  Plan: Discussed etiology pathology conservative surgical therapies samples of skin and nail were taken today for pathologic evaluation follow-up with her in 1 month     Nayelie Gionfriddo T. Winkelman, [place]

## 2022-05-02 ENCOUNTER — Telehealth: Payer: Self-pay | Admitting: Family Medicine

## 2022-05-02 NOTE — Telephone Encounter (Signed)
Tried calling patient to schedule Medicare Annual Wellness Visit (AWV) either virtually or in office. Left  my Zachery Conch number [phone]  No answer   Last AWV 04/07/21 ; please schedule at anytime with [facility] Nurse Health Advisor 1 or 2

## 2022-06-14 ENCOUNTER — Ambulatory Visit (INDEPENDENT_AMBULATORY_CARE_PROVIDER_SITE_OTHER): Payer: Medicare PPO | Admitting: Family Medicine

## 2022-06-14 ENCOUNTER — Encounter: Payer: Self-pay | Admitting: Family Medicine

## 2022-06-14 VITALS — BP 122/78 | HR 71 | Temp 97.6°F | Ht 62.0 in | Wt 134.7 lb

## 2022-06-14 DIAGNOSIS — Z1231 Encounter for screening mammogram for malignant neoplasm of breast: Secondary | ICD-10-CM | POA: Diagnosis not present

## 2022-06-14 DIAGNOSIS — F32A Depression, unspecified: Secondary | ICD-10-CM

## 2022-06-14 DIAGNOSIS — Z780 Asymptomatic menopausal state: Secondary | ICD-10-CM

## 2022-06-14 DIAGNOSIS — F02818 Dementia in other diseases classified elsewhere, unspecified severity, with other behavioral disturbance: Secondary | ICD-10-CM

## 2022-06-14 DIAGNOSIS — G301 Alzheimer's disease with late onset: Secondary | ICD-10-CM | POA: Diagnosis not present

## 2022-06-14 DIAGNOSIS — I10 Essential (primary) hypertension: Secondary | ICD-10-CM

## 2022-06-14 MED ORDER — SERTRALINE HCL 50 MG PO TABS: 50.0000 mg | ORAL_TABLET | Freq: Every day | ORAL | 1 refills | Status: DC

## 2022-06-14 MED ORDER — DONEPEZIL HCL 10 MG PO TABS
ORAL_TABLET | ORAL | 1 refills | Status: DC
Start: 2022-06-14 — End: 2022-12-19

## 2022-06-14 MED ORDER — MEMANTINE HCL 10 MG PO TABS: 10.0000 mg | ORAL_TABLET | Freq: Two times a day (BID) | ORAL | 1 refills | Status: DC

## 2022-06-14 NOTE — Progress Notes (Signed)
   Established Patient Office Visit  Subjective   Patient ID: [name], female    DOB: 1951/05/17  Age: 71 y.o. MRN: [id number]  Chief Complaint  Patient presents with  . Establish Care    Patient is here with her sister for her transition of care visit. Patient has a history of Alzheimer's dementia. Patient states that she feels a little more irritable some days,    {History (Optional):23778}  Review of Systems  All other systems reviewed and are negative.     Objective:     BP 122/78 (BP Location: Left Arm, Patient Position: Sitting, Cuff Size: Normal)   Pulse 71   Temp 97.6 F (36.4 C) (Oral)   Ht 5\' 2"  (1.575 m)   Wt 134 lb 11.2 oz (61.1 kg)   SpO2 98%   BMI 24.64 kg/m  {Vitals History (Optional):23777}  Physical Exam Vitals reviewed.  Constitutional:      Appearance: Normal appearance. She is well-groomed and normal weight.  HENT:     Head: Normocephalic and atraumatic.  Eyes:     Extraocular Movements: Extraocular movements intact.     Conjunctiva/sclera: Conjunctivae normal.     Pupils: Pupils are equal, round, and reactive to light.  Cardiovascular:     Rate and Rhythm: Normal rate and regular rhythm.     Pulses: Normal pulses.     Heart sounds: S1 normal and S2 normal.  Pulmonary:     Effort: Pulmonary effort is normal.     Breath sounds: Normal breath sounds and air entry.  Abdominal:     General: Abdomen is flat. Bowel sounds are normal.     Palpations: Abdomen is soft.  Musculoskeletal:        General: Normal range of motion.     Cervical back: Normal range of motion and neck supple.     Right lower leg: No edema.     Left lower leg: No edema.  Skin:    General: Skin is warm and dry.  Neurological:     Mental Status: She is alert and oriented to person, place, and time. Mental status is at baseline.     Gait: Gait is intact.  Psychiatric:        Mood and Affect: Mood and affect normal.        Speech: Speech normal.         Behavior: Behavior normal.        Judgment: Judgment normal.     No results found for any visits on 06/14/22.  {Labs (Optional):23779}  The 10-year ASCVD risk score (Arnett DK, et al., 2019) is: 8.9%    Assessment & Plan:   Problem List Items Addressed This Visit       Cardiovascular and Mediastinum   Essential hypertension     Nervous and Auditory   Late onset Alzheimer's disease with behavioral disturbance (HCC) - Primary   Relevant Medications   sertraline (ZOLOFT) 50 MG tablet   memantine (NAMENDA) 10 MG tablet   donepezil (ARICEPT) 10 MG tablet   Other Relevant Orders   Ambulatory referral to Home Health     Other   Chronic depression   Relevant Medications   sertraline (ZOLOFT) 50 MG tablet    Return in about 3 months (around 09/14/2022) for follow up dementia.    09/16/2022, MD

## 2022-06-15 NOTE — Assessment & Plan Note (Signed)
Increasing irritability at home, increasing sertraline to 50 mg daily.

## 2022-06-15 NOTE — Assessment & Plan Note (Signed)
I will continue the aricept 10 mg daily, and I have instructed the patient and sister to increase her memantine to 10 mg BID. I believe she is beginning to develop some behavioral disturbance from her Alzheimers, I will increase her sertraline to 50 mg daily and see her back in 3 months to re-evaluate her symptoms. She may need addition of a mood stabilizer (seroquel or risperidone) at bedtime to help with sleep.

## 2022-07-07 ENCOUNTER — Ambulatory Visit: Payer: Medicare PPO | Admitting: Podiatrist

## 2022-08-09 ENCOUNTER — Encounter: Payer: Self-pay | Admitting: Podiatry

## 2022-08-09 ENCOUNTER — Ambulatory Visit: Payer: Medicare PPO | Admitting: Podiatry

## 2022-08-09 DIAGNOSIS — M79676 Pain in unspecified toe(s): Secondary | ICD-10-CM | POA: Diagnosis not present

## 2022-08-09 DIAGNOSIS — B351 Tinea unguium: Secondary | ICD-10-CM | POA: Diagnosis not present

## 2022-08-09 NOTE — Progress Notes (Signed)
  Subjective:  Patient ID: [name], female    DOB: [birth year],  MRN: [id number]  [name] presents to clinic today for:  Chief Complaint  Patient presents with   Nail Problem    Routine foot care PCP-Barbara Micheal PCP VST- 06/19/2022   New problem(s): None.   Patient's sister, [name], present during today's visit. Sister refuses to sign patient's ABN. [name] states her daughter (patient's niece) is the person who handles patient's finances. [name] is declining trimming of callus for that reason on today's visit.  PCP is Farrel Conners, MD.  Allergies  Allergen Reactions   Erythromycin Base Nausea Only   Other Nausea And Vomiting and Other (See Comments)    Mycins-dizziness    Review of Systems: Negative except as noted in the HPI.  Objective: No changes noted in today's physical examination.  [name] is a pleasant 71 y.o. female WD, WN in NAD. AAO x 3.  Vascular Examination: Capillary refill time immediate b/l.Vascular status intact b/l with palpable pedal pulses. Pedal hair present b/l. No edema. No pain with calf compression b/l. Skin temperature gradient WNL b/l.   Neurological Examination: Sensation grossly intact b/l with 10 gram monofilament. Vibratory sensation intact b/l.   Dermatological Examination: Pedal skin with normal turgor, texture and tone b/l. Toenails 1-5 b/l thick, discolored, elongated with subungual debris and pain on dorsal palpation. Hyperkeratotic lesion(s) medial IPJ of left great toe, medial IPJ of right great toe, and submet head 1 right foot.  No erythema, no edema, no drainage, no fluctuance.  Musculoskeletal Examination: Normal muscle strength 5/5 to all lower extremity muscle groups bilaterally. No pain, crepitus or joint limitation noted with ROM b/l LE. No gross bony pedal deformities b/l. Patient ambulates independently without assistive aids.  Radiographs: None  Assessment/Plan: 1. Pain due to onychomycosis  of toenail     No orders of the defined types were placed in this encounter.   -Patient's family member present. All questions/concerns addressed on today's visit. -Consent given for treatment as described below: -Examined patient. -Patient's sister, [name], refused to sign patient's ABN for calluses today. States it should be signed by patient's niece who handles her financial obligations. Will have billing dept contact patient's niece for document signing. Instructed [name] to use pumice stone once weekly after bath/shower followed by application of moisturizer for management of calluses. She related understanding. -Patient to continue soft, supportive shoe gear daily. -Mycotic toenails 1-5 bilaterally were debrided in length and girth with sterile nail nippers and dremel without incident. -Patient/POA to call should there be question/concern in the interim.   Return in about 3 months (around 11/09/2022).  Marzetta Board, DPM

## 2022-08-12 ENCOUNTER — Ambulatory Visit: Payer: Medicare PPO

## 2022-08-12 ENCOUNTER — Ambulatory Visit
Admission: RE | Admit: 2022-08-12 | Discharge: 2022-08-12 | Disposition: A | Discharge status: Home / Self Care | Payer: Medicare PPO | Source: Ambulatory Visit | Attending: Family Medicine | Admitting: Family Medicine

## 2022-08-12 DIAGNOSIS — Z1231 Encounter for screening mammogram for malignant neoplasm of breast: Secondary | ICD-10-CM

## 2022-08-15 ENCOUNTER — Encounter: Payer: Self-pay | Admitting: Physician Assistant

## 2022-08-15 ENCOUNTER — Ambulatory Visit: Payer: Medicare PPO | Admitting: Physician Assistant

## 2022-08-15 VITALS — BP 137/75 | HR 70 | Resp 20 | Ht 62.0 in | Wt 133.0 lb

## 2022-08-15 DIAGNOSIS — F02818 Dementia in other diseases classified elsewhere, unspecified severity, with other behavioral disturbance: Secondary | ICD-10-CM

## 2022-08-15 DIAGNOSIS — R413 Other amnesia: Secondary | ICD-10-CM

## 2022-08-15 DIAGNOSIS — G301 Alzheimer's disease with late onset: Secondary | ICD-10-CM

## 2022-08-15 NOTE — Patient Instructions (Signed)
It was a pleasure to see you today at our office.   Recommendations:   Follow up April 23 at 11:30 am  Continue memantine, make sure that he is 10 mg twice daily  Continue donepezil 10 mg daily Continue to control mood as per PCP, patient sertraline recently increased to 50 mg daily agree with Seroquel as per PCP Continue 24/7 monitor, recommend home health nursing. Start speech therapy Replenish B12  Whom to call:  Memory  decline, memory medications: Call our office [phone]   For psychiatric meds, mood meds: Please have your primary care physician manage these medications.   Counseling regarding caregiver distress, including caregiver depression, anxiety and issues regarding community resources, adult day care programs, adult living facilities, or memory care questions:   Feel free to contact Misty Lisabeth Register, Social Worker at [phone]   For assessment of decision of mental capacity and competency:  Call Dr. Erick Blinks, geriatric psychiatrist at [phone]  For guidance in geriatric dementia issues please call [facility] [phone]  For guidance regarding [facility] and if placement were needed at the facility, contact Sidney Ace, Social Worker tel: [phone]  If you have any severe symptoms of a stroke, or other severe issues such as confusion,severe chills or fever, etc call 911 or go to the ER as you may need to be evaluated further    Feel free to visit Facebook page " [email or web address]" for tips of how to care for people with memory problems.    RECOMMENDATIONS FOR ALL PATIENTS WITH MEMORY PROBLEMS: 1. Continue to exercise (Recommend 30 minutes of walking everyday, or 3 hours every week) 2. Increase social interactions - continue going to [place] and enjoy social gatherings with friends and family 3. Eat healthy, avoid fried foods and eat more fruits and vegetables 4. Maintain adequate blood pressure, blood sugar, and blood  cholesterol level. Reducing the risk of stroke and cardiovascular disease also helps promoting better memory. 5. Avoid stressful situations. Live a simple life and avoid aggravations. Organize your time and prepare for the next day in anticipation. 6. Sleep well, avoid any interruptions of sleep and avoid any distractions in the bedroom that may interfere with adequate sleep quality 7. Avoid sugar, avoid sweets as there is a strong link between excessive sugar intake, diabetes, and cognitive impairment We discussed the Mediterranean diet, which has been shown to help patients reduce the risk of progressive memory disorders and reduces cardiovascular risk. This includes eating fish, eat fruits and green leafy vegetables, nuts like almonds and hazelnuts, walnuts, and also use olive oil. Avoid fast foods and fried foods as much as possible. Avoid sweets and sugar as sugar use has been linked to worsening of memory function.  There is always a concern of gradual progression of memory problems. If this is the case, then we may need to adjust level of care according to patient needs. Support, both to the patient and caregiver, should then be put into place.     FALL PRECAUTIONS: Be cautious when walking. Scan the area for obstacles that may increase the risk of trips and falls. When getting up in the mornings, sit up at the edge of the bed for a few minutes before getting out of bed. Consider elevating the bed at the head end to avoid drop of blood pressure when getting up. Walk always in a well-lit room (use night lights in the walls). Avoid area rugs or power cords from appliances in the middle of  the walkways. Use a walker or a cane if necessary and consider physical therapy for balance exercise. Get your eyesight checked regularly.  FINANCIAL OVERSIGHT: Supervision, especially oversight when making financial decisions or transactions is also recommended.  HOME SAFETY: Consider the safety of the kitchen  when operating appliances like stoves, microwave oven, and blender. Consider having supervision and share cooking responsibilities until no longer able to participate in those. Accidents with firearms and other hazards in the house should be identified and addressed as well.   ABILITY TO BE LEFT ALONE: If patient is unable to contact 911 operator, consider using LifeLine, or when the need is there, arrange for someone to stay with patients. Smoking is a fire hazard, consider supervision or cessation. Risk of wandering should be assessed by caregiver and if detected at any point, supervision and safe proof recommendations should be instituted.  MEDICATION SUPERVISION: Inability to self-administer medication needs to be constantly addressed. Implement a mechanism to ensure safe administration of the medications.   DRIVING: Regarding driving, in patients with progressive memory problems, driving will be impaired. We advise to have someone else do the driving if trouble finding directions or if minor accidents are reported. Independent driving assessment is available to determine safety of driving.   If you are interested in the driving assessment, you can contact the following:  [employer] in [place] [phone]  [facility] [phone]  [facility] [phone] or [phone]    Mediterranean Diet A Mediterranean diet refers to food and lifestyle choices that are based on the traditions of countries located on the [employer]. This way of eating has been shown to help prevent certain conditions and improve outcomes for people who have chronic diseases, like kidney disease and heart disease. What are tips for following this plan? Lifestyle  Cook and eat meals together with your family, when possible. Drink enough fluid to keep your urine clear or pale yellow. Be physically active every day. This  includes: Aerobic exercise like running or swimming. Leisure activities like gardening, walking, or housework. Get 7-8 hours of sleep each night. If recommended by your health care provider, drink red wine in moderation. This means 1 glass a day for nonpregnant women and 2 glasses a day for men. A glass of wine equals 5 oz (150 mL). Reading food labels  Check the serving size of packaged foods. For foods such as rice and pasta, the serving size refers to the amount of cooked product, not dry. Check the total fat in packaged foods. Avoid foods that have saturated fat or trans fats. Check the ingredients list for added sugars, such as corn syrup. Shopping  At the grocery store, buy most of your food from the areas near the walls of the store. This includes: Fresh fruits and vegetables (produce). Grains, beans, nuts, and seeds. Some of these may be available in unpackaged forms or large amounts (in bulk). Fresh seafood. Poultry and eggs. Low-fat dairy products. Buy whole ingredients instead of prepackaged foods. Buy fresh fruits and vegetables in-season from local farmers markets. Buy frozen fruits and vegetables in resealable bags. If you do not have access to quality fresh seafood, buy precooked frozen shrimp or canned fish, such as tuna, salmon, or sardines. Buy small amounts of raw or cooked vegetables, salads, or olives from the deli or salad bar at your store. Stock your pantry so you always have certain foods on hand, such as olive oil, canned tuna, canned  tomatoes, rice, pasta, and beans. Cooking  Cook foods with extra-virgin olive oil instead of using butter or other vegetable oils. Have meat as a side dish, and have vegetables or grains as your main dish. This means having meat in small portions or adding small amounts of meat to foods like pasta or stew. Use beans or vegetables instead of meat in common dishes like chili or lasagna. Experiment with different cooking methods. Try  roasting or broiling vegetables instead of steaming or sauteing them. Add frozen vegetables to soups, stews, pasta, or rice. Add nuts or seeds for added healthy fat at each meal. You can add these to yogurt, salads, or vegetable dishes. Marinate fish or vegetables using olive oil, lemon juice, garlic, and fresh herbs. Meal planning  Plan to eat 1 vegetarian meal one day each week. Try to work up to 2 vegetarian meals, if possible. Eat seafood 2 or more times a week. Have healthy snacks readily available, such as: Vegetable sticks with hummus. Greek yogurt. Fruit and nut trail mix. Eat balanced meals throughout the week. This includes: Fruit: 2-3 servings a day Vegetables: 4-5 servings a day Low-fat dairy: 2 servings a day Fish, poultry, or lean meat: 1 serving a day Beans and legumes: 2 or more servings a week Nuts and seeds: 1-2 servings a day Whole grains: 6-8 servings a day Extra-virgin olive oil: 3-4 servings a day Limit red meat and sweets to only a few servings a month What are my food choices? Mediterranean diet Recommended Grains: Whole-grain pasta. Brown rice. Bulgar wheat. Polenta. Couscous. Whole-wheat bread. [place]. Vegetables: Artichokes. Beets. Broccoli. Cabbage. Carrots. Eggplant. Green beans. Chard. Kale. Spinach. Onions. Leeks. Peas. Squash. Tomatoes. Peppers. Radishes. Fruits: Apples. Apricots. Avocado. Berries. Bananas. Cherries. Dates. Figs. Grapes. Lemons. Melon. Oranges. Peaches. Plums. Pomegranate. Meats and other protein foods: Beans. Almonds. Sunflower seeds. Pine nuts. Peanuts. [place]. Salmon. Scallops. Shrimp. [place]. Tilapia. Clams. Oysters. Eggs. Dairy: Low-fat milk. Cheese. Greek yogurt. Beverages: Water. Red wine. Herbal tea. Fats and oils: Extra virgin olive oil. Avocado oil. Grape seed oil. Sweets and desserts: [place] yogurt with honey. Baked apples. Poached pears. Trail mix. Seasoning and other foods: Basil. Cilantro. Coriander. Cumin. Mint.  Parsley. Sage. Rosemary. Tarragon. Garlic. Oregano. Thyme. Pepper. Balsalmic vinegar. Tahini. Hummus. Tomato sauce. Olives. Mushrooms. Limit these Grains: Prepackaged pasta or rice dishes. Prepackaged cereal with added sugar. Vegetables: Deep fried potatoes (french fries). Fruits: Fruit canned in syrup. Meats and other protein foods: Beef. Pork. Lamb. Poultry with skin. Hot dogs. [place] Salines. Dairy: Ice cream. Sour cream. Whole milk. Beverages: Juice. Sugar-sweetened soft drinks. Beer. Liquor and spirits. Fats and oils: Butter. Canola oil. Vegetable oil. Beef fat (tallow). Lard. Sweets and desserts: Cookies. Cakes. Pies. Candy. Seasoning and other foods: Mayonnaise. Premade sauces and marinades. The items listed may not be a complete list. Talk with your dietitian about what dietary choices are right for you. Summary The Mediterranean diet includes both food and lifestyle choices. Eat a variety of fresh fruits and vegetables, beans, nuts, seeds, and whole grains. Limit the amount of red meat and sweets that you eat. Talk with your health care provider about whether it is safe for you to drink red wine in moderation. This means 1 glass a day for nonpregnant women and 2 glasses a day for men. A glass of wine equals 5 oz (150 mL). This information is not intended to replace advice given to you by your health care provider. Make sure you discuss any questions you have with your  health care provider. Document Released: 06/02/2016 Document Revised: 07/05/2016 Document Reviewed: 06/02/2016 Elsevier Interactive Patient Education  2017 [employer].

## 2022-08-15 NOTE — Progress Notes (Signed)
Assessment/Plan:   Dementia likely due to Alzheimer's disease with behavioral disturbance, late onset  [name] is a very pleasant 71 y.o. RH female  with a history of hypertension, hyperlipidemia, vitamin D deficiency, vitamin B-12 deficiency, depression, IBS, Alzheimer's dementia with behavioral disturbance per neurocognitive testing on 12/30/19 seen today in follow up for memory loss. Patient is currently on memantine 10 mg twice daily (until recently she was on memantine 10 mg daily recently increased to bid), donepezil 10 mg daily, tolerating well.  She is also on sertraline recently increased to 50 mg daily  and Seroquel for mood control as per PCP.  With the exception of mild changes with her long-term memory, overall, she is stable from the cognitive standpoint.  She still has some issues with swallowing solids, and has not yet been contacted to start speech therapy will place another referral.   Follow up in  6 months. Continue memantine, make sure that he is 10 mg twice daily (she was taking memantine only once a day) Continue donepezil 10 mg daily Continue to control mood as per PCP, patient sertraline recently increased to 50 mg daily agree with Seroquel as per PCP Continue 24/7 monitor, recommend home health nursing. Referral speech therapy Replenish B12 (last labs in February 2023 272)    Subjective:    This patient is accompanied in the office by her sister and niece who supplements the history.  Previous records as well as any outside records available were reviewed prior to todays visit.  She was last seen on 02/10/2022.  Any changes in memory since last visit? "It is about the same", STM < LTM but  sister reports that the patient cannot remember her birth date,. Does word finding, as well as watching TV.  She likes to go to dance competitions that her grand nieces, and going out to eat with them.  Sometimes she does not remember how to use names, which can be  frustrating. repeats oneself?  Not as much.  She has difficulties expressing herself, which leads her to some frustration.  She is able to understand well however. Disoriented when walking into a room?  Patient denies   Leaving objects in unusual places?  Patient denies   Ambulates  with difficulty?   Patient denies , [facility]".   Recent falls?  Patient denies   Any head injuries?  Patient denies   History of seizures?   Patient denies   Wandering behavior?  Patient denies   Patient drives?   Patient no longer drives  Any mood changes since last visit?  Sometimes she is overwhelmed, she does not know why, and she becomes tearful  Hallucinations?  Patient denies, but sister reports that she talks to people that they are not there Paranoia?  Patient denies   Patient reports that sleeps well without vivid dreams, REM behavior or sleepwalking "but if she doesn't sleep well it is not an ok day" History of sleep apnea?  Patient denies   Any hygiene concerns? Endorsed.  Niece reports that she gets very agitated if she has to use a shower.  She still able to brush her teeth without difficulty.  Her sister is going to buy her to bidet.  Independent of bathing and dressing?  Her family assists her with the clothing. Does the patient needs help with medications?  Sister in charge  Who is in charge of the finances? Her niece in charge  Any changes in appetite?  Patient denies, she "doesn't eat a lot" Patient have trouble swallowing? Sometimes, not often, may have trouble with solids.  It is unclear why she did not start speech therapy to this date. Does the patient cook?  Patient denies    Any kitchen accidents such as leaving the stove on? Patient denies   Any headaches?  Occasionally, especially when she did not have her soda. Double vision? Patient denies   Any focal numbness or tingling?  Patient denies   Chronic back pain Patient denies   Unilateral weakness?   Patient denies   Any history of anosmia?  Patient denies   Any incontinence of urine?  Patient denies, but at night she goes 2 times at night (although her last soda at 8 pm) Any bowel dysfunction?   Patient denies      Patient lives with: sister     HISTORY OF PRESENT ILLNESS:   This is a pleasant 71 year old right-handed woman with a history of hypertension, hyperlipidemia, depression, IBS, Alzheimer's dementia with behavioral disturbance, presenting to establish care. She was previously seen by Dr. Tomi Likens, records were reviewed. She started having memory issues in 2013 after a concussion. In 2018, she got lost driving. 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 Memory difficulties continued to worsen in 2020, she would forget conversations quickly and forget familiar places. Repeat Neuropsychological evaluation in March 2021 indicated significant decline in several areas, including measures of attention/working memory, verbal fluency, and visuospatial functioning and executive functioning. Etiology most likely Alzheimer's disease. Concern was raised that she has lost the ability to independently make medical decisions and manage medications/finances. Guardianship may be appropriate if caregiving is met with resistance. She was in the ER in June 2021 when family could not get in touch with her and found her wandering outside, she had locked herself outside and did not know why she was outside or how long she had been there. She appeared to be hallucinating, talking about a giraffe in the backyard and a snake in the house. She was treated for a UTI. She has been living with her sister since then. She thinks her memory is good, "might not be perfect." [name] took over finances after the incident last June. She manages her own pillbox and denies missing medications. She has not been driving since June. She does not cook. She has occasional word-finding difficulties,  some days are better than others. She is independent with dressing and bathing. No further hallucinations. She gets frustrated with herself and can be a little short with other, no paranoia. [name] thinks she ran out of Citalopram and feels that she looks sad. She wants to go home and [name] feels they are keeping her hostage. She is on Donepezil 40m daily and Memantine 135mdaily without side effects.   She denies any headaches, dizziness, diplopia, dysarthria, dysphagia, neck/back pain, focal numbness/tingling/weakness, bowel/bladder dysfunction. No anosmia, tremors, no falls. Sleep is good. Her father had dementia. She denies any significant head injuries, no alcohol use. She used to work as an inProduction assistant, radioor [employer] CT head without contrast done 03/2020 showed diffuse atrophy, chronic microvascular disease.   PREVIOUS MEDICATIONS:   CURRENT MEDICATIONS:  Outpatient Encounter Medications as of 08/15/2022  Medication Sig   Cholecalciferol 1000 UNITS capsule Take 1,000 Units by mouth daily.   donepezil (ARICEPT) 10 MG tablet TAKE 1 TABLET BY MOUTH EVERYDAY AT BEDTIME   memantine (NAMENDA) 10 MG tablet  Take 1 tablet (10 mg total) by mouth 2 (two) times daily.   sertraline (ZOLOFT) 50 MG tablet Take 1 tablet (50 mg total) by mouth daily.   No facility-administered encounter medications on file as of 08/15/2022.       01/22/2020    2:00 PM 08/24/2018    8:48 AM  MMSE - Mini Mental State Exam  Orientation to time 3 5  Orientation to Place 4 5  Registration 3 3  Attention/ Calculation 5 5  Recall 0 0  Language- name 2 objects 2 2  Language- repeat 0 1  Language- follow 3 step command 2 3  Language- read & follow direction 1 1  Write a sentence 1 1  Copy design 1 1  Total score 22 27      11/17/2020   10:00 AM 10/15/2019    9:00 AM 03/30/2017    1:00 PM  Montreal Cognitive Assessment   Visuospatial/ Executive (0/5) 1  2  Naming (0/3) 3  2  Attention: Read  list of digits (0/2) 0 1 1  Attention: Read list of letters (0/1) _0 Attention: Serial 7 subtraction starting at 100 (0/3) _1 Language: Repeat phrase (0/2) 0 1 1  Language : Fluency (0/1) 0 1 1  Abstraction (0/2) _2 Delayed Recall (0/5) 0 0 1  Orientation (0/6) _3 Total 11  19  Adjusted Score (based on education) 11  20    Objective:     PHYSICAL EXAMINATION:    VITALS:   Vitals:   08/15/22 1052  BP: 137/75  Pulse: 70  Resp: 20  SpO2: 98%  Weight: 133 lb (60.3 kg)  Height: 5' 2" (1.575 m)    GEN:  The patient appears stated age and is in NAD. HEENT:  Normocephalic, atraumatic.   Neurological examination:  General: NAD, well-groomed, appears stated age. Orientation: The patient is alert. Oriented to person, place and  not to date Cranial nerves: There is good facial symmetry.The speech is not  fluent but clear. No aphasia or dysarthria. Fund of knowledge is reduced. Recent and remote memory are impaired. Attention and concentration are reduced.  Able to name objects and repeat phrases.  Hearing is intact to conversational tone.    Sensation: Sensation is intact to light touch throughout Motor: Strength is at least antigravity x4. DTR's 2/4 in UE/LE     Movement examination: Tone: There is normal tone in the UE/LE Abnormal movements:  very mild resting tremor .  No myoclonus.  No asterixis.   Coordination:  There is some decremation with RAM's especially when alternating movements with her hands, no difficulty with tapping. Normal finger to nose  Gait and Station: The patient has no difficulty arising out of a deep-seated chair without the use of the hands. The patient's stride length is good.  Gait is cautious and narrow.    Thank you for allowing [place] the opportunity to participate in the care of this nice patient. Please do not hesitate to contact us for any questions or concerns.   Total time spent on today's visit was 54 minutes dedicated to this  patient today, preparing to see patient, examining the patient, ordering tests and/or medications and counseling the patient, documenting clinical information in the EHR or other health record, independently interpreting results and communicating results to the patient/family, discussing treatment and goals, answering patient's questions and coordinating care.  Cc:  Farrel Conners, MD  Sharene Butters 08/15/2022 12:08 PM

## 2022-08-15 NOTE — Progress Notes (Signed)
Negative mammo follow up in 1 year

## 2022-08-22 ENCOUNTER — Ambulatory Visit: Payer: Medicare PPO | Attending: Physician Assistant | Admitting: Speech Pathology

## 2022-08-22 ENCOUNTER — Encounter: Payer: Self-pay | Admitting: Speech Pathology

## 2022-08-22 ENCOUNTER — Other Ambulatory Visit: Payer: Self-pay

## 2022-08-22 DIAGNOSIS — R41841 Cognitive communication deficit: Secondary | ICD-10-CM | POA: Diagnosis present

## 2022-08-22 NOTE — Therapy (Unsigned)
OUTPATIENT SPEECH LANGUAGE PATHOLOGY EVALUATION   Patient Name: [name] MRN: [id number] DOB:13-Mar-1951, 71 y.o., female Today's Date: 08/22/2022  PCP: Karie Georges, MD REFERRING PROVIDER: Elwyn Reach   End of Session - 08/22/22 1619     Visit Number 1    Number of Visits 13    Date for SLP Re-Evaluation 10/17/22    Progress Note Due on Visit 10    SLP Start Time 1400    SLP Stop Time  1446    SLP Time Calculation (min) 46 min    Activity Tolerance Patient tolerated treatment well             Past Medical History:  Diagnosis Date   Acute sialoadenitis 02/28/2011   B submand    Altered consciousness 02/17/2017   4/18  transient impairment of awareness ?etiology x 10 min ASA qd Dr Everlena Cooper   Alzheimer disease [facility])    CONTACT DERMATITIS&OTHER ECZEMA DUE TO PLANTS 02/13/2008   Qualifier: Diagnosis of  By: Yetta Barre MD, Bernadene Bell.    Depression    Hyperlipidemia    LBP (low back pain)    NONSPECIFIC ABNORMAL RESULTS LIVR FUNCTION STUDY 11/13/2007   Qualifier: Diagnosis of  By: Plotnikov MD, Georgina Quint    OA (osteoarthritis)    Paronychia of great toe of left foot 01/21/2013   3/14 The toenail is going to come off - likely s/p trauma 1 mo ago    Pneumonia due to organism 05/05/2014   7/15 R (clinical dx) 9/16 RML (CT)    Vitamin B 12 deficiency    Past Surgical History:  Procedure Laterality Date   CHOLECYSTECTOMY  1990   Patient Active Problem List   Diagnosis Date Noted   Late onset Alzheimer's disease with behavioral disturbance (HCC) 08/05/2021   Memory problem 06/12/2019   Alopecia areata 05/30/2014   Tendinopathy of left biceps tendon 10/04/2013   Trigger finger, acquired 10/04/2013   Left shoulder pain 09/03/2013   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   Onychomycosis of toenail 01/22/2013   IBS (irritable bowel syndrome) 09/09/2011   ABDOMINAL PAIN, LOWER 02/02/2009   Essential hypertension 09/26/2008   B12 deficiency 11/13/2007    Dyslipidemia 11/13/2007   LOW BACK PAIN 11/13/2007   Chronic depression 07/14/2007   OSTEOARTHRITIS 07/14/2007    ONSET DATE: referral 08/15/22; onset of cognitive changes reported 2018    REFERRING DIAG: R41.3 (ICD-10-CM) - Memory loss  THERAPY DIAG:  Cognitive communication deficit  Rationale for Evaluation and Treatment Habilitation  SUBJECTIVE:   SUBJECTIVE STATEMENT: "She can't answer simple questions"  Pt accompanied by: family member, sister [name]  PERTINENT HISTORY: Per Neurology PN, "repeat neuropsychological evaluation conducted by Dr. Roseanne Reno on 01/22/2020.  Testing showed significant decline from previous evaluation in 2018 with findings consistent with late mild Alzheimer's dementia with behavioral disturbance."  PAIN:  Are you having pain? No   FALLS: Has patient fallen in last 6 months?  No  LIVING ENVIRONMENT: Lives with:  sister about 3 years  Lives in: House/apartment  PLOF:  Level of assistance: Needed assistance with ADLs Employment: Retired   PATIENT GOALS "help her communicate"   OBJECTIVE:   COGNITION: Overall cognitive status: Impaired Areas of impairment:  Dx with dementia  Functional deficits: total-A for activities of daily living, impaired communication  COGNITIVE COMMUNICATION Following directions: follows 1/3 1 step directions with direct model and repetition Auditory comprehension: Impaired: requires usual repetitions Verbal expression: Impaired: usual anomia, mazing, inability to answer simple  questions requiring one word response Functional communication: Impaired: unable to communicate basic wants/needs/thoughts/ideas; receptive impairment, coupled with cognitive impairment impacting participation in ADLs such as bathing  ORAL MOTOR EXAMINATION Overall status: Did not assess  STANDARDIZED ASSESSMENTS: Deferred d/t severity of impairments and dx of dementia    PATIENT REPORTED OUTCOME MEASURES (PROM): Deferred d/t time  constraints   TODAY'S TREATMENT:  Education provided on evaluation results and SLP's recommendations. Initiated training re: supportive communication for communicating with person with dementia and endorsed recommendation to seek in home care aid. Care partner and pt verbalizes agreement with POC, all questions answered to satisfaction.   PATIENT EDUCATION: Education details: see above Person educated: Patient and Caregiver   Education method: Medical illustrator Education comprehension: verbalized understanding, returned demonstration, verbal cues required, and needs further education  GOALS: Goals reviewed with patient? Yes  SHORT TERM GOALS: Target date: 09/19/2022  Pt and caregiver will initiate generation of memory book, with A from SLP, to aid in orientation, recall and communication re: personally relevant places, names, trips Baseline: Goal status: INITIAL  2.  Pt and caregiver will add x4 pages to memory book with mod-I over 2 week period Baseline:  Goal status: INITIAL  3.  Pt will utilize low tech AAC device to communicate response to questions with usual mod-A in 50% of opportunities Baseline:  Goal status: INITIAL  4.  Care partner will demonstrate use of supportive communication strategies to aid in increased communication autonomy of pt's over 2 sessions Baseline:  Goal status: INITIAL  5.  Pt will complete clinical swallow evaluation  Baseline:  Goal status: INITIAL    LONG TERM GOALS: Target date: 10/17/2022  Caregivers/pt will use external memory aids or memory book to recall and communicate re: personally relevant places, names, trips etc over 4 sessions Baseline:  Goal status: INITIAL  2.  Pt and care partner will report carryover of low-tech AAC device usage at home, with benefit, over 1 week period Baseline:  Goal status: INITIAL  3.  Care partner will demonstration communication strategies to aid in completion of ADLs Baseline:  Goal  status: INITIAL  4.  Care partner will report improvement in ability to complete ADLs (e.g. bathing) at home through carryover of communication strategies or aids over 1 week period  Baseline:  Goal status: INITIAL  5.  Care partner will teach back diet modifications recommended to aid in safe, ongoing PO administration of preferred foods and liquids  Baseline:  Goal status: INITIAL   ASSESSMENT:  CLINICAL IMPRESSION: Seren Chaloux presents with impaired memory, orientation, attention, executive functioning, verbal expression, and receptive language. She had MCI dx since 2018. Repeat neuropsych testing in 2021 indicates Alzheimer's. Pt is currently requiring full time care. Significant cognitive impairment, coupled with communication impairment is impacting pt's interactions with caregivers. Primary concern this date, verbalized by caregiver, sister [name], is pt's inability to answer questions or verbally express her thoughts, wants, needs. Will plan to provide care partners with strategies to aid in communication and implement low tech AAC system to aid in autonomy of expression. Report occasional (1x monthly) instance of choking or coughing with solids. SLP will plan to complete clinical swallow evaluation at first visit and provide caregivers with education on diet strategies and modifications that may be implemented to aid in safe PO.   OBJECTIVE IMPAIRMENTS include attention, memory, awareness, executive functioning, expressive language, receptive language, and dysphagia. These impairments are limiting patient from ADLs/IADLs, effectively communicating at home and in community, and  safety when swallowing. Factors affecting potential to achieve goals and functional outcome are ability to learn/carryover information, co-morbidities, medical prognosis, and severity of impairments Patient will benefit from skilled SLP services to address above impairments and improve overall function.  REHAB  POTENTIAL: Fair goals established based on pt's dementia dx  PLAN: SLP FREQUENCY: 1-2x/week (initiating at 1x per week, will adjust depending on pt and caregiver progress)  SLP DURATION: 8 weeks  PLANNED INTERVENTIONS: Diet toleration management , Environmental controls, Internal/external aids, Functional tasks, Multimodal communication approach, SLP instruction and feedback, Compensatory strategies, and Patient/family education    Su Monks, CCC-SLP 08/22/2022, 4:20 PM

## 2022-08-26 ENCOUNTER — Telehealth: Payer: Self-pay | Admitting: Family Medicine

## 2022-08-26 DIAGNOSIS — F02818 Dementia in other diseases classified elsewhere, unspecified severity, with other behavioral disturbance: Secondary | ICD-10-CM

## 2022-08-26 MED ORDER — QUETIAPINE FUMARATE 25 MG PO TABS: 25.0000 mg | ORAL_TABLET | Freq: Every day | ORAL | 0 refills | Status: DC

## 2022-08-26 NOTE — Telephone Encounter (Signed)
I spoke with [facility], she is aware of message below.

## 2022-08-26 NOTE — Telephone Encounter (Signed)
Please advise 

## 2022-08-26 NOTE — Telephone Encounter (Signed)
Pt's niece says Pt is not sleeping and has become very emotional.  The family is having a heck of a time dealing with Pt not sleeping. Pt is on a low dose anti-depressant, but can MD please call in something to help her sleep?     [id number] - [place], [place] Phone: [phone]  Fax: [phone]

## 2022-08-30 ENCOUNTER — Telehealth: Payer: Self-pay | Admitting: Family Medicine

## 2022-08-30 NOTE — Telephone Encounter (Signed)
Ok-- it shouldn't cause her any harm. Has the prior auth been completed for the seroquel?

## 2022-08-30 NOTE — Telephone Encounter (Signed)
[name] with   [id number] - [place], [place] [facility] Phone: [phone]  Fax: [phone]     Called to say Pt accidentally got Lamictal 25mg  on 08/26/22 and took about 3 doses...  Instead of the QUEtiapine (SEROQUEL) 25 MG tablet - (In progress but, Insurance will need PA)

## 2022-08-30 NOTE — Telephone Encounter (Signed)
Spoke with Marjory Lies and informed him of the message below.  Marjory Lies provided insurance information and a prior auth for Quetiapine 25mg  was sent to [email or web address]-Key: [id number] pending review by insurance.

## 2022-09-05 ENCOUNTER — Ambulatory Visit: Payer: Medicare PPO | Attending: Physician Assistant | Admitting: Speech Pathology

## 2022-09-05 ENCOUNTER — Encounter: Payer: Self-pay | Admitting: Speech Pathology

## 2022-09-05 DIAGNOSIS — R41841 Cognitive communication deficit: Secondary | ICD-10-CM | POA: Insufficient documentation

## 2022-09-05 NOTE — Patient Instructions (Signed)
   Consider liquid meds if taking pills gets to be too much  Photos of family and people you see frequently  1 person in a photo - recent, clear  Try a white board with simple directions (1-2)  1.Panties  2.Pants  3. Shoes  [email or web address] (dementia care)  Try the white board with baths - 1 step at a time  [employer] your bottom  If you have an early  appointment, it may be easier to change her at night when she has more time  Think about a day program a couple days a week  Think about disposable bed pads to protect the bed and furniture  Consider cutting the seroquel

## 2022-09-05 NOTE — Therapy (Signed)
OUTPATIENT SPEECH LANGUAGE PATHOLOGY TREATMENT NOTE   Patient Name: [name] MRN: [id number] DOB:05/29/1951, 71 y.o., female Today's Date: 09/05/2022  PCP: Nira Conn MD REFERRING PROVIDER: Rockwell Alexandria  END OF SESSION:   End of Session - 09/05/22 0849     Visit Number 2    Number of Visits 13    Date for SLP Re-Evaluation 10/17/22    SLP Start Time 0845    SLP Stop Time  0930    SLP Time Calculation (min) 45 min    Activity Tolerance Patient tolerated treatment well             Past Medical History:  Diagnosis Date   Acute sialoadenitis 02/28/2011   B submand    Altered consciousness 02/17/2017   4/18  transient impairment of awareness ?etiology x 10 min ASA qd Dr Everlena Cooper   Alzheimer disease [facility])    CONTACT DERMATITIS&OTHER ECZEMA DUE TO PLANTS 02/13/2008   Qualifier: Diagnosis of  By: Yetta Barre MD, Bernadene Bell.    Depression    Hyperlipidemia    LBP (low back pain)    NONSPECIFIC ABNORMAL RESULTS LIVR FUNCTION STUDY 11/13/2007   Qualifier: Diagnosis of  By: Plotnikov MD, Georgina Quint    OA (osteoarthritis)    Paronychia of great toe of left foot 01/21/2013   3/14 The toenail is going to come off - likely s/p trauma 1 mo ago    Pneumonia due to organism 05/05/2014   7/15 R (clinical dx) 9/16 RML (CT)    Vitamin B 12 deficiency    Past Surgical History:  Procedure Laterality Date   CHOLECYSTECTOMY  1990   Patient Active Problem List   Diagnosis Date Noted   Late onset Alzheimer's disease with behavioral disturbance (HCC) 08/05/2021   Memory problem 06/12/2019   Alopecia areata 05/30/2014   Tendinopathy of left biceps tendon 10/04/2013   Trigger finger, acquired 10/04/2013   Left shoulder pain 09/03/2013   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   Onychomycosis of toenail 01/22/2013   IBS (irritable bowel syndrome) 09/09/2011   ABDOMINAL PAIN, LOWER 02/02/2009   Essential hypertension 09/26/2008   B12 deficiency 11/13/2007   Dyslipidemia  11/13/2007   LOW BACK PAIN 11/13/2007   Chronic depression 07/14/2007   OSTEOARTHRITIS 07/14/2007    ONSET DATE:  referral 08/15/22; onset of cognitive changes reported 2018         REFERRING DIAG:  R41.3 (ICD-10-CM) - Memory loss  THERAPY DIAG:  Cognitive communication deficit  Rationale for Evaluation and Treatment: Habilitation  SUBJECTIVE:   SUBJECTIVE STATEMENT: "I left her clothes out but she didn't change them, she just put on her shoes"  PAIN:  Are you having pain? No   OBJECTIVE:   TODAY'S TREATMENT:  DATE:  09/05/22: Initiated trials of low tech AAC - [name] was able to choose family member from written choice of 3, She Id'd restaurants from choice of 5-6 to simple question/description. [name] generated list of family that they frequently interact with and restaurants they frequent. Initiated communication/memory book with family, restaurants and orders. Difficulty getting dressed and bathing - generated strategies of using written commands or numbered steps to cue [name], rather than [name] telling her what to do, then leaving the room. [name] will implement this and we will evaluate success or difficulties next session. [name] is taking 1-2 cups of applesauce to take 4 pills as pills get stuck in her cheek. I suggested asking about liquid meds for ease of oral manipulation. At this time, [name] demonstrate success with only written cues, however [name] will get photos of people and places to plan for progression of dementia to add to her communication/memory book. See patient instructions. They cancelled next 2 appointments as they are traveling for Thanksgiving   PATIENT EDUCATION: Education details: see above, see patient instructions Person educated: Patient and Caregiver   Education method: Medical illustrator, Handout Education  comprehension: verbalized understanding, returned demonstration, verbal cues required, and needs further education  GOALS: Goals reviewed with patient? Yes   SHORT TERM GOALS: Target date: 09/19/2022   Pt and caregiver will initiate generation of memory book, with A from SLP, to aid in orientation, recall and communication re: personally relevant places, names, trips Baseline: Goal status: ONGOING   2.  Pt and caregiver will add x4 pages to memory book with mod-I over 2 week period Baseline:  Goal status: ONGOING   3.  Pt will utilize low tech AAC device to communicate response to questions with usual mod-A in 50% of opportunities Baseline:  Goal status: ONGOING   4.  Care partner will demonstrate use of supportive communication strategies to aid in increased communication autonomy of pt's over 2 sessions Baseline:  Goal status: ONGOING   5.  Pt will complete clinical swallow evaluation             Baseline:             Goal status:ONGOING     LONG TERM GOALS: Target date: 10/17/2022   Caregivers/pt will use external memory aids or memory book to recall and communicate re: personally relevant places, names, trips etc over 4 sessions Baseline:  Goal status: ONGOING   2.  Pt and care partner will report carryover of low-tech AAC device usage at home, with benefit, over 1 week period Baseline:  Goal status: ONGOING   3.  Care partner will demonstration communication strategies to aid in completion of ADLs Baseline:  Goal status:ONGOING   4.  Care partner will report improvement in ability to complete ADLs (e.g. bathing) at home through carryover of communication strategies or aids over 1 week period  Baseline:  Goal status: ONGOING   5.  Care partner will teach back diet modifications recommended to aid in safe, ongoing PO administration of preferred foods and liquids             Baseline:             Goal status: ONGOING     ASSESSMENT:   CLINICAL IMPRESSION: [name] presents with impaired memory, orientation, attention, executive functioning, verbal expression, and receptive language. She had MCI dx since 2018. Repeat neuropsych testing in 2021 indicates Alzheimer's. Pt is currently requiring full time care. Significant cognitive impairment, coupled  with communication impairment is impacting pt's interactions with caregivers. Primary concern this date, verbalized by caregiver, sister [name], is pt's inability to answer questions or verbally express her thoughts, wants, needs. Ongoing training of caregiver [name] in  strategies to aid in communication and implement low tech AAC system to aid in autonomy of expression. Report occasional (1x monthly) instance of choking or coughing with solids. SLP will plan to complete clinical swallow evaluation at first visit and provide caregivers with education on diet strategies and modifications that may be implemented to aid in safe PO.    OBJECTIVE IMPAIRMENTS include attention, memory, awareness, executive functioning, expressive language, receptive language, and dysphagia. These impairments are limiting patient from ADLs/IADLs, effectively communicating at home and in community, and safety when swallowing. Factors affecting potential to achieve goals and functional outcome are ability to learn/carryover information, co-morbidities, medical prognosis, and severity of impairments Patient will benefit from skilled SLP services to address above impairments and improve overall function.    Jamala Kohen, Radene Journey, CCC-SLP 09/05/2022, 9:57 AM

## 2022-09-12 ENCOUNTER — Ambulatory Visit: Payer: Medicare PPO | Admitting: Speech Pathology

## 2022-09-14 ENCOUNTER — Ambulatory Visit: Payer: Medicare PPO | Admitting: Family Medicine

## 2022-09-19 ENCOUNTER — Encounter: Payer: Medicare PPO | Admitting: Speech Pathology

## 2022-09-20 ENCOUNTER — Ambulatory Visit (INDEPENDENT_AMBULATORY_CARE_PROVIDER_SITE_OTHER): Payer: Medicare PPO

## 2022-09-20 VITALS — Ht 62.0 in | Wt 133.0 lb

## 2022-09-20 DIAGNOSIS — Z0000 Encounter for general adult medical examination without abnormal findings: Secondary | ICD-10-CM

## 2022-09-20 NOTE — Progress Notes (Signed)
Subjective:   [name] is a 71 y.o. female who presents for Medicare Annual (Subsequent) preventive examination.  Review of Systems    Virtual Visit via Telephone Note  I connected with  [name] on 09/20/22 at 10:00 AM EST by telephone and verified that I am speaking with the correct person using two identifiers.  Location: Patient: Home Provider: Office Persons participating in the virtual visit: patient/Nurse Health Advisor   I discussed the limitations, risks, security and privacy concerns of performing an evaluation and management service by telephone and the availability of in person appointments. The patient expressed understanding and agreed to proceed.  Interactive audio and video telecommunications were attempted between this nurse and patient, however failed, due to patient having technical difficulties OR patient did not have access to video capability.  We continued and completed visit with audio only.  Some vital signs may be absent or patient reported.   Tillie RungBeverly W Camden Knotek, LPN  Cardiac Risk Factors include: advanced age (>7355men, 65>65 women);hypertension     Objective:    Today's Vitals   09/20/22 1006  Weight: 133 lb (60.3 kg)  Height: 5\' 2"  (1.575 m)   Body mass index is 24.33 kg/m.     09/20/2022   10:16 AM 08/22/2022    1:54 PM 08/15/2022   10:56 AM 02/10/2022   10:43 AM 08/05/2021   11:10 AM 04/07/2021    2:46 PM 11/17/2020   10:34 AM  Advanced Directives  Does Patient Have a Medical Advance Directive? Yes Yes Yes Yes Yes Yes Yes  Type of Estate agentAdvance Directive Healthcare Power of EctorAttorney;Living will     Healthcare Power of [employer] Street Corporationttorney Healthcare Power of Attorney  Does patient want to make changes to medical advance directive? No - Patient declined        Copy of Healthcare Power of Attorney in Chart? Yes - validated most recent copy scanned in chart (See row information)     Yes - validated most recent copy scanned in chart (See row  information)     Current Medications (verified) Outpatient Encounter Medications as of 09/20/2022  Medication Sig   Cholecalciferol 1000 UNITS capsule Take 1,000 Units by mouth daily.   donepezil (ARICEPT) 10 MG tablet TAKE 1 TABLET BY MOUTH EVERYDAY AT BEDTIME   memantine (NAMENDA) 10 MG tablet Take 1 tablet (10 mg total) by mouth 2 (two) times daily.   QUEtiapine (SEROQUEL) 25 MG tablet Take 1 tablet (25 mg total) by mouth at bedtime.   sertraline (ZOLOFT) 50 MG tablet Take 1 tablet (50 mg total) by mouth daily.   No facility-administered encounter medications on file as of 09/20/2022.    Allergies (verified) Erythromycin base and Other   History: Past Medical History:  Diagnosis Date   Acute sialoadenitis 02/28/2011   B submand    Altered consciousness 02/17/2017   4/18  transient impairment of awareness ?etiology x 10 min ASA qd Dr Everlena CooperJaffe   Alzheimer disease [facility](HCC)    CONTACT DERMATITIS&OTHER ECZEMA DUE TO PLANTS 02/13/2008   Qualifier: Diagnosis of  By: Yetta BarreJones MD, Bernadene Bellhomas L.    Depression    Hyperlipidemia    LBP (low back pain)    NONSPECIFIC ABNORMAL RESULTS LIVR FUNCTION STUDY 11/13/2007   Qualifier: Diagnosis of  By: Plotnikov MD, Georgina QuintAleksei V    OA (osteoarthritis)    Paronychia of great toe of left foot 01/21/2013   3/14 The toenail is going to come off - likely s/p trauma 1 mo ago  Pneumonia due to organism 05/05/2014   7/15 R (clinical dx) 9/16 RML (CT)    Vitamin B 12 deficiency    Past Surgical History:  Procedure Laterality Date   CHOLECYSTECTOMY  1990   Family History  Problem Relation Age of Onset   Pneumonia Father    Emphysema Father        smoker   Diabetes Mother    Heart disease Mother    Hypertension Mother    Pancreatic cancer Mother    Liver cancer Mother    Diabetes Other    Hypertension Other    Colon cancer Maternal Grandmother 11   Other Brother        died in infancy   Pneumonia Brother 56       was smoker   Social History    Socioeconomic History   Marital status: Single    Spouse name: Not on file   Number of children: 0   Years of education: 12   Highest education level: Not on file  Occupational History   Occupation: retired  Tobacco Use   Smoking status: Never   Smokeless tobacco: Never  Vaping Use   Vaping Use: Never used  Substance and Sexual Activity   Alcohol use: No   Drug use: No   Sexual activity: Not Currently  Other Topics Concern   Not on file  Social History Narrative   Lives with sister in a 1 1/2 story home.  No children.  Retired from [employer].  Education: high school.  Right handed   Drinks caffeine on occasion   Social Determinants of Health   Financial Resource Strain: Low Risk  (09/20/2022)   Overall Financial Resource Strain (CARDIA)    Difficulty of Paying Living Expenses: Not hard at all  Food Insecurity: No Food Insecurity (09/20/2022)   Hunger Vital Sign    Worried About Running Out of Food in the Last Year: Never true    Ran Out of Food in the Last Year: Never true  Transportation Needs: No Transportation Needs (09/20/2022)   PRAPARE - Administrator, Civil Service (Medical): No    Lack of Transportation (Non-Medical): No  Physical Activity: Inactive (09/20/2022)   Exercise Vital Sign    Days of Exercise per Week: 0 days    Minutes of Exercise per Session: 0 min  Stress: No Stress Concern Present (09/20/2022)   [employer] of Occupational Health - Occupational Stress Questionnaire    Feeling of Stress : Not at all  Social Connections: Moderately Integrated (09/20/2022)   Social Connection and Isolation Panel [NHANES]    Frequency of Communication with Friends and Family: More than three times a week    Frequency of Social Gatherings with Friends and Family: More than three times a week    Attends Religious Services: More than 4 times per year    Active Member of [employer] or Organizations: Yes    Attends Engineer, structural: More  than 4 times per year    Marital Status: Never married    Tobacco Counseling Counseling given: Not Answered   Clinical Intake:  Pre-visit preparation completed: No  Pain : No/denies pain     BMI - recorded: 24.33 Nutritional Status: BMI of 19-24  Normal Nutritional Risks: None Diabetes: No  How often do you need to have someone help you when you read instructions, pamphlets, or other written materials from your doctor or pharmacy?: 5 - Always (Sister Assist)  Diabetic?  No  Interpreter Needed?: No  Information entered by :: Theresa Mulligan LPN   Activities of Daily Living    09/20/2022   10:15 AM  In your present state of health, do you have any difficulty performing the following activities:  Hearing? 0  Vision? 0  Difficulty concentrating or making decisions? 0  Walking or climbing stairs? 0  Dressing or bathing? 0  Doing errands, shopping? 0  Preparing Food and eating ? N  Using the Toilet? N  In the past six months, have you accidently leaked urine? N  Do you have problems with loss of bowel control? N  Managing your Medications? N  Managing your Finances? N  Housekeeping or managing your Housekeeping? N    Patient Care Team: Karie Georges, MD as PCP - General (Family Medicine) Jethro Bolus, MD as Consulting Physician (Ophthalmology) Drema Dallas, DO as Consulting Physician (Neurology) Van Clines, MD as Consulting Physician (Neurology) Bridgett Larsson, LCSW as Social Worker (Licensed Clinical Social Worker)  Indicate any recent Medical Services you may have received from other than [facility] providers in the past year (date may be approximate).     Assessment:   This is a routine wellness examination for [name].  Hearing/Vision screen Hearing Screening - Comments:: Denies hearing difficulties   Vision Screening - Comments:: Wears rx glasses - up to date with routine eye exams with    Dietary issues and exercise activities  discussed: Exercise limited by: None identified   Goals Addressed               This Visit's Progress     No current goals (pt-stated)         Depression Screen    09/20/2022   10:14 AM 03/15/2022   10:36 AM 04/07/2021    2:45 PM 08/30/2019    8:20 AM 08/24/2018    8:58 AM 07/31/2017    9:07 AM 02/17/2017    8:15 AM  PHQ 2/9 Scores  PHQ - 2 Score 0 3 0 1 0 2 0  PHQ- 9 Score  12  1 0 4     Fall Risk    09/20/2022   10:15 AM 08/15/2022   10:54 AM 03/15/2022   10:37 AM 02/10/2022   10:43 AM 12/17/2021    9:12 AM  Fall Risk   Falls in the past year? 1 0 1 0 0  Number falls in past yr: 0 0 0 0 0  Injury with Fall? 0 0 0 0 0  Risk for fall due to : No Fall Risks  Other (Comment)  Other (Comment)  Follow up Falls prevention discussed Falls evaluation completed Falls evaluation completed  Falls evaluation completed    FALL RISK PREVENTION PERTAINING TO THE HOME:  Any stairs in or around the home? Yes  If so, are there any without handrails? No  Home free of loose throw rugs in walkways, pet beds, electrical cords, etc? Yes  Adequate lighting in your home to reduce risk of falls? Yes   ASSISTIVE DEVICES UTILIZED TO PREVENT FALLS:  Life alert? No  Use of a cane, walker or w/c? No  Grab bars in the bathroom? No  Shower chair or bench in shower? No  Elevated toilet seat or a handicapped toilet? No   TIMED UP AND GO:  Was the test performed? No . Audio Visit   Cognitive Function:    01/22/2020    2:00 PM 08/24/2018    8:48 AM  MMSE -  Mini Mental State Exam  Orientation to time 3 5  Orientation to Place 4 5  Registration 3 3  Attention/ Calculation 5 5  Recall 0 0  Language- name 2 objects 2 2  Language- repeat 0 1  Language- follow 3 step command 2 3  Language- read & follow direction 1 1  Write a sentence 1 1  Copy design 1 1  Total score 22 27      11/17/2020   10:00 AM 10/15/2019    9:00 AM 03/30/2017    1:00 PM  Montreal Cognitive Assessment    Visuospatial/ Executive (0/5) 1  2  Naming (0/3) 3  2  Attention: Read list of digits (0/2) 0 1 1  Attention: Read list of letters (0/1) 1 1 1   Attention: Serial 7 subtraction starting at 100 (0/3) 1 1 2   Language: Repeat phrase (0/2) 0 1 1  Language : Fluency (0/1) 0 1 1  Abstraction (0/2) 2 1 2   Delayed Recall (0/5) 0 0 1  Orientation (0/6) 3 4 6   Total 11  19  Adjusted Score (based on education) 11  20      08/30/2019    8:38 AM  6CIT Screen  What Year? 0 points  What month? 0 points  Count back from 20 0 points  Months in reverse 2 points  Repeat phrase 2 points    Immunizations Immunization History  Administered Date(s) Administered   Fluad Quad(high Dose 65+) 06/12/2019   Influenza Whole 08/14/2002, 09/10/2007, 08/10/2010   Influenza, High Dose Seasonal PF 08/26/2016, 07/31/2017, 08/24/2018   Influenza,inj,Quad PF,6+ Mos 08/04/2014   Influenza-Unspecified 07/31/2015   Moderna Sars-Covid-2 Vaccination 09/23/2020   PFIZER(Purple Top)SARS-COV-2 Vaccination 12/22/2019, 02/19/2020   PNEUMOCOCCAL CONJUGATE-20 12/17/2021   Pneumococcal Conjugate-13 08/26/2016   Pneumococcal Polysaccharide-23 08/28/2001, 02/17/2017   Td 11/09/1998, 02/17/2010   Tdap 01/04/2020   Zoster, Live 06/07/2012      Flu Vaccine status: Due, Education has been provided regarding the importance of this vaccine. Advised may receive this vaccine at local pharmacy or Health Dept. Aware to provide a copy of the vaccination record if obtained from local pharmacy or Health Dept. Verbalized acceptance and understanding.  Pneumococcal vaccine status: Completed during today's visit.  Covid-19 vaccine status: Completed vaccines  Qualifies for Shingles Vaccine? Yes   Zostavax completed No   Shingrix Completed?: No.    Education has been provided regarding the importance of this vaccine. Patient has been advised to call insurance company to determine out of pocket expense if they have not yet received  this vaccine. Advised may also receive vaccine at local pharmacy or Health Dept. Verbalized acceptance and understanding.  Screening Tests Health Maintenance  Topic Date Due   COVID-19 Vaccine (4 - 2023-24 season) 10/06/2022 (Originally 06/24/2022)   Zoster Vaccines- Shingrix (1 of 2) 12/21/2022 (Originally 08/23/2001)   INFLUENZA VACCINE  01/22/2023 (Originally 05/24/2022)   COLONOSCOPY (Pts 45-42yrs Insurance coverage will need to be confirmed)  03/21/2023   Medicare Annual Wellness (AWV)  09/21/2023   MAMMOGRAM  08/12/2024   Pneumonia Vaccine 35+ Years old  Completed   DEXA SCAN  Completed   Hepatitis C Screening  Completed   HPV VACCINES  Aged Out    Health Maintenance  There are no preventive care reminders to display for this patient.   Colorectal cancer screening: Type of screening: Colonoscopy. Completed 03/20/13. Repeat every 10 years  Mammogram status: Completed 08/12/22. Repeat every year  Bone Density status: Ordered Scheduled 12/12/22. Pt provided  with contact info and advised to call to schedule appt.  Lung Cancer Screening: (Low Dose CT Chest recommended if Age 6-80 years, 30 pack-year currently smoking OR have quit w/in 15years.) does not qualify.     Additional Screening:  Hepatitis C Screening: does qualify; Completed 03/17/21  Vision Screening: Recommended annual ophthalmology exams for early detection of glaucoma and other disorders of the eye. Is the patient up to date with their annual eye exam?  Yes  Who is the provider or what is the name of the office in which the patient attends annual eye exams? Dr Leeanne Mannan If pt is not established with a provider, would they like to be referred to a provider to establish care? No .   Dental Screening: Recommended annual dental exams for proper oral hygiene  Community Resource Referral / Chronic Care Management:  CRR required this visit?  No   CCM required this visit?  No      Plan:     I have personally  reviewed and noted the following in the patient's chart:   Medical and social history Use of alcohol, tobacco or illicit drugs  Current medications and supplements including opioid prescriptions. Patient is not currently taking opioid prescriptions. Functional ability and status Nutritional status Physical activity Advanced directives List of other physicians Hospitalizations, surgeries, and ER visits in previous 12 months Vitals Screenings to include cognitive, depression, and falls Referrals and appointments  In addition, I have reviewed and discussed with patient certain preventive protocols, quality metrics, and best practice recommendations. A written personalized care plan for preventive services as well as general preventive health recommendations were provided to patient.     Tillie Rung, LPN   [id number]   Nurse Notes: None

## 2022-09-20 NOTE — Patient Instructions (Addendum)
[name] , Thank you for taking time to come for your Medicare Wellness Visit. I appreciate your ongoing commitment to your health goals. Please review the following plan we discussed and let me know if I can assist you in the future.   These are the goals we discussed:  Goals       Find Help in My Community      Patient Goals/Self-Care Activities: Over the next 120 days Attend all scheduled medical appointments Utilize healthy coping skills and/or supportive resources discussed Contact PCP office with any questions or concerns      No current goals (pt-stated)      Patient Stated      I am going to try to get on the bicycle more often and eat healthier.      Patient Stated      I want to increase my physical activity by getting riding my more routinely.      Patient Stated      Lose weight         This is a list of the screening recommended for you and due dates:  Health Maintenance  Topic Date Due   COVID-19 Vaccine (4 - 2023-24 season) 10/06/2022*   Zoster (Shingles) Vaccine (1 of 2) 12/21/2022*   Flu Shot  01/22/2023*   Colon Cancer Screening  03/21/2023   Medicare Annual Wellness Visit  09/21/2023   Mammogram  08/12/2024   Pneumonia Vaccine  Completed   DEXA scan (bone density measurement)  Completed   Hepatitis C Screening: USPSTF Recommendation to screen - Ages 61-79 yo.  Completed   HPV Vaccine  Aged Out  *Topic was postponed. The date shown is not the original due date.    Advanced directives: In Chart  Conditions/risks identified: None  Next appointment: Follow up in one year for your annual wellness visit     Preventive Care 65 Years and Older, Female Preventive care refers to lifestyle choices and visits with your health care provider that can promote health and wellness. What does preventive care include? A yearly physical exam. This is also called an annual well check. Dental exams once or twice a year. Routine eye exams. Ask your health care  provider how often you should have your eyes checked. Personal lifestyle choices, including: Daily care of your teeth and gums. Regular physical activity. Eating a healthy diet. Avoiding tobacco and drug use. Limiting alcohol use. Practicing safe sex. Taking low-dose aspirin every day. Taking vitamin and mineral supplements as recommended by your health care provider. What happens during an annual well check? The services and screenings done by your health care provider during your annual well check will depend on your age, overall health, lifestyle risk factors, and family history of disease. Counseling  Your health care provider may ask you questions about your: Alcohol use. Tobacco use. Drug use. Emotional well-being. Home and relationship well-being. Sexual activity. Eating habits. History of falls. Memory and ability to understand (cognition). Work and work Astronomer. Reproductive health. Screening  You may have the following tests or measurements: Height, weight, and BMI. Blood pressure. Lipid and cholesterol levels. These may be checked every 5 years, or more frequently if you are over 18 years old. Skin check. Lung cancer screening. You may have this screening every year starting at age 71 if you have a 30-pack-year history of smoking and currently smoke or have quit within the past 15 years. Fecal occult blood test (FOBT) of the stool. You may have this  test every year starting at age 28. Flexible sigmoidoscopy or colonoscopy. You may have a sigmoidoscopy every 5 years or a colonoscopy every 10 years starting at age 55. Hepatitis C blood test. Hepatitis B blood test. Sexually transmitted disease (STD) testing. Diabetes screening. This is done by checking your blood sugar (glucose) after you have not eaten for a while (fasting). You may have this done every 1-3 years. Bone density scan. This is done to screen for osteoporosis. You may have this done starting at age  53. Mammogram. This may be done every 1-2 years. Talk to your health care provider about how often you should have regular mammograms. Talk with your health care provider about your test results, treatment options, and if necessary, the need for more tests. Vaccines  Your health care provider may recommend certain vaccines, such as: Influenza vaccine. This is recommended every year. Tetanus, diphtheria, and acellular pertussis (Tdap, Td) vaccine. You may need a Td booster every 10 years. Zoster vaccine. You may need this after age 82. Pneumococcal 13-valent conjugate (PCV13) vaccine. One dose is recommended after age 82. Pneumococcal polysaccharide (PPSV23) vaccine. One dose is recommended after age 6. Talk to your health care provider about which screenings and vaccines you need and how often you need them. This information is not intended to replace advice given to you by your health care provider. Make sure you discuss any questions you have with your health care provider. Document Released: 11/06/2015 Document Revised: 06/29/2016 Document Reviewed: 08/11/2015 Elsevier Interactive Patient Education  2017 [employer].  Fall Prevention in the Home Falls can cause injuries. They can happen to people of all ages. There are many things you can do to make your home safe and to help prevent falls. What can I do on the outside of my home? Regularly fix the edges of walkways and driveways and fix any cracks. Remove anything that might make you trip as you walk through a door, such as a raised step or threshold. Trim any bushes or trees on the path to your home. Use bright outdoor lighting. Clear any walking paths of anything that might make someone trip, such as rocks or tools. Regularly check to see if handrails are loose or broken. Make sure that both sides of any steps have handrails. Any raised decks and porches should have guardrails on the edges. Have any leaves, snow, or ice cleared  regularly. Use sand or salt on walking paths during winter. Clean up any spills in your garage right away. This includes oil or grease spills. What can I do in the bathroom? Use night lights. Install grab bars by the toilet and in the tub and shower. Do not use towel bars as grab bars. Use non-skid mats or decals in the tub or shower. If you need to sit down in the shower, use a plastic, non-slip stool. Keep the floor dry. Clean up any water that spills on the floor as soon as it happens. Remove soap buildup in the tub or shower regularly. Attach bath mats securely with double-sided non-slip rug tape. Do not have throw rugs and other things on the floor that can make you trip. What can I do in the bedroom? Use night lights. Make sure that you have a light by your bed that is easy to reach. Do not use any sheets or blankets that are too big for your bed. They should not hang down onto the floor. Have a firm chair that has side arms. You can  use this for support while you get dressed. Do not have throw rugs and other things on the floor that can make you trip. What can I do in the kitchen? Clean up any spills right away. Avoid walking on wet floors. Keep items that you use a lot in easy-to-reach places. If you need to reach something above you, use a strong step stool that has a grab bar. Keep electrical cords out of the way. Do not use floor polish or wax that makes floors slippery. If you must use wax, use non-skid floor wax. Do not have throw rugs and other things on the floor that can make you trip. What can I do with my stairs? Do not leave any items on the stairs. Make sure that there are handrails on both sides of the stairs and use them. Fix handrails that are broken or loose. Make sure that handrails are as long as the stairways. Check any carpeting to make sure that it is firmly attached to the stairs. Fix any carpet that is loose or worn. Avoid having throw rugs at the top or  bottom of the stairs. If you do have throw rugs, attach them to the floor with carpet tape. Make sure that you have a light switch at the top of the stairs and the bottom of the stairs. If you do not have them, ask someone to add them for you. What else can I do to help prevent falls? Wear shoes that: Do not have high heels. Have rubber bottoms. Are comfortable and fit you well. Are closed at the toe. Do not wear sandals. If you use a stepladder: Make sure that it is fully opened. Do not climb a closed stepladder. Make sure that both sides of the stepladder are locked into place. Ask someone to hold it for you, if possible. Clearly mark and make sure that you can see: Any grab bars or handrails. First and last steps. Where the edge of each step is. Use tools that help you move around (mobility aids) if they are needed. These include: Canes. Walkers. Scooters. Crutches. Turn on the lights when you go into a dark area. Replace any light bulbs as soon as they burn out. Set up your furniture so you have a clear path. Avoid moving your furniture around. If any of your floors are uneven, fix them. If there are any pets around you, be aware of where they are. Review your medicines with your doctor. Some medicines can make you feel dizzy. This can increase your chance of falling. Ask your doctor what other things that you can do to help prevent falls. This information is not intended to replace advice given to you by your health care provider. Make sure you discuss any questions you have with your health care provider. Document Released: 08/06/2009 Document Revised: 03/17/2016 Document Reviewed: 11/14/2014 Elsevier Interactive Patient Education  2017 [employer].

## 2022-09-21 ENCOUNTER — Ambulatory Visit: Payer: Medicare PPO | Admitting: Family Medicine

## 2022-09-21 ENCOUNTER — Encounter: Payer: Self-pay | Admitting: Family Medicine

## 2022-09-21 VITALS — BP 132/80 | HR 76 | Ht 62.0 in | Wt 133.7 lb

## 2022-09-21 DIAGNOSIS — G301 Alzheimer's disease with late onset: Secondary | ICD-10-CM | POA: Diagnosis not present

## 2022-09-21 DIAGNOSIS — F32A Depression, unspecified: Secondary | ICD-10-CM | POA: Diagnosis not present

## 2022-09-21 DIAGNOSIS — Z23 Encounter for immunization: Secondary | ICD-10-CM

## 2022-09-21 DIAGNOSIS — F02818 Dementia in other diseases classified elsewhere, unspecified severity, with other behavioral disturbance: Secondary | ICD-10-CM

## 2022-09-21 DIAGNOSIS — I10 Essential (primary) hypertension: Secondary | ICD-10-CM

## 2022-09-21 DIAGNOSIS — E559 Vitamin D deficiency, unspecified: Secondary | ICD-10-CM

## 2022-09-21 MED ORDER — QUETIAPINE FUMARATE 50 MG PO TABS: 50.0000 mg | ORAL_TABLET | Freq: Every day | ORAL | 1 refills | Status: DC

## 2022-09-21 NOTE — Assessment & Plan Note (Signed)
Sleeping with the seroquel is much improved per the sister's report. However she continues to have some nights where she cannot sleep. I recommend increasing the seroquel to 50 mg at bedtime, new rx was sent to the pharmacy today. RTC in 4 months for follow up.

## 2022-09-21 NOTE — Progress Notes (Signed)
Established Patient Office Visit  Subjective   Patient ID: [name], female    DOB: [birth year]  Age: 71 y.o. MRN: [id number]  Chief Complaint  Patient presents with   Follow-up    Patient and sister are here for follow up on her dementia. Last visit we increased her memantine 10 mg BID and increased her sertraline to 50 mg and we added 25 mg of seroquel at night. Sister reports that she is sleeping much better with the seroquel, states that there are still days when she doesn't sleep and this causes her to have a very bad day the next day. We discussed increasing the seroquel to 50 mg at bedtime and she is agreeable. Sister reports that mood symptoms are controlled.     Patient Active Problem List   Diagnosis Date Noted   Late onset Alzheimer's disease with behavioral disturbance ([facility]) 08/05/2021   Memory problem 06/12/2019   Alopecia areata 05/30/2014   Tendinopathy of left biceps tendon 10/04/2013   Trigger finger, acquired 10/04/2013   Left shoulder pain 09/03/2013   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   Onychomycosis of toenail 01/22/2013   IBS (irritable bowel syndrome) 09/09/2011   ABDOMINAL PAIN, LOWER 02/02/2009   Essential hypertension 09/26/2008   B12 deficiency 11/13/2007   Dyslipidemia 11/13/2007   LOW BACK PAIN 11/13/2007   Chronic depression 07/14/2007   OSTEOARTHRITIS 07/14/2007      Review of Systems  All other systems reviewed and are negative.     Objective:     BP 132/80 (BP Location: Right Arm, Patient Position: Sitting, Cuff Size: Normal)   Pulse 76   Ht 5\' 2"  (1.575 m)   Wt 133 lb 11.2 oz (60.6 kg)   BMI 24.45 kg/m    Physical Exam Vitals reviewed.  Constitutional:      Appearance: Normal appearance. She is well-groomed and normal weight.  HENT:     Head: Normocephalic and atraumatic.  Eyes:     Conjunctiva/sclera: Conjunctivae normal.  Cardiovascular:     Rate and Rhythm: Normal rate and regular rhythm.     Heart  sounds: S1 normal and S2 normal.  Pulmonary:     Effort: Pulmonary effort is normal.     Breath sounds: Normal breath sounds and air entry.  Musculoskeletal:        General: Normal range of motion.     Cervical back: Normal range of motion and neck supple.     Right lower leg: No edema.     Left lower leg: No edema.  Skin:    General: Skin is warm and dry.  Neurological:     Mental Status: She is alert. Mental status is at baseline. She is confused.     Coordination: Coordination is intact.     Gait: Gait is intact.     Comments: She is oriented to person only today.   Psychiatric:        Mood and Affect: Mood and affect normal.        Speech: Speech normal.        Behavior: Behavior normal.      No results found for any visits on 09/21/22.    The 10-year ASCVD risk score (Arnett DK, et al., 2019) is: 11.4%    Assessment & Plan:   Problem List Items Addressed This Visit       Unprioritized   Chronic depression    Mood symptoms are well controlled on the increased dose of sertraline 50  mg daily, will continue this dose.       Essential hypertension    History of, pt not on any medication for this and today's BP is WNL, ordered her annual labs for March.      Relevant Orders   Lipid Panel   CMP   Late onset Alzheimer's disease with behavioral disturbance (HCC)    Sleeping with the seroquel is much improved per the sister's report. However she continues to have some nights where she cannot sleep. I recommend increasing the seroquel to 50 mg at bedtime, new rx was sent to the pharmacy today. RTC in 4 months for follow up.      Relevant Medications   QUEtiapine (SEROQUEL) 50 MG tablet   Other Visit Diagnoses     Need for immunization against influenza    -  Primary   Relevant Orders   Flu Vaccine QUAD High Dose(Fluad)   Vitamin D deficiency       Relevant Orders   Vitamin D, 25-hydroxy       Return in about 4 months (around 01/20/2023).    Karie Georges, MD

## 2022-09-21 NOTE — Assessment & Plan Note (Signed)
Mood symptoms are well controlled on the increased dose of sertraline 50 mg daily, will continue this dose.

## 2022-09-21 NOTE — Assessment & Plan Note (Signed)
History of, pt not on any medication for this and today's BP is WNL, ordered her annual labs for March.

## 2022-09-26 ENCOUNTER — Ambulatory Visit: Payer: Medicare PPO | Admitting: Speech Pathology

## 2022-09-30 NOTE — Therapy (Deleted)
OUTPATIENT SPEECH LANGUAGE PATHOLOGY TREATMENT NOTE   Patient Name: [name] MRN: [id number] DOB:08-21-1951, 71 y.o., female Today's Date: 09/30/2022  PCP: [name] REFERRING PROVIDER: Durenda Age [name]  END OF SESSION:     Past Medical History:  Diagnosis Date   Acute sialoadenitis 02/28/2011   B submand    Altered consciousness 02/17/2017   4/18  transient impairment of awareness ?etiology x 10 min ASA qd [name]   Alzheimer disease [name])    CONTACT DERMATITIS&OTHER [facility] DUE TO PLANTS 02/13/2008   Qualifier: Diagnosis of  By: [name].    Depression    Hyperlipidemia    LBP (low back pain)    NONSPECIFIC ABNORMAL RESULTS LIVR FUNCTION STUDY 11/13/2007   Qualifier: Diagnosis of  By: [name]    OA (osteoarthritis)    Paronychia of great toe of left foot 01/21/2013   3/14 The toenail is going to come off - likely s/p trauma 1 mo ago    Pneumonia due to organism 05/05/2014   7/15 R (clinical dx) 9/16 RML (CT)    Vitamin B 12 deficiency    Past Surgical History:  Procedure Laterality Date   CHOLECYSTECTOMY  1990   Patient Active Problem List   Diagnosis Date Noted   Late onset Alzheimer's disease with behavioral disturbance (HCC) 08/05/2021   Memory problem 06/12/2019   Alopecia areata 05/30/2014   Tendinopathy of left biceps tendon 10/04/2013   Trigger finger, acquired 10/04/2013   Left shoulder pain 09/03/2013   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   Onychomycosis of toenail 01/22/2013   IBS (irritable bowel syndrome) 09/09/2011   ABDOMINAL PAIN, LOWER 02/02/2009   Essential hypertension 09/26/2008   B12 deficiency 11/13/2007   Dyslipidemia 11/13/2007   LOW BACK PAIN 11/13/2007   Chronic depression 07/14/2007   OSTEOARTHRITIS 07/14/2007    ONSET DATE:  referral 08/15/22; onset of cognitive changes reported 2018         REFERRING DIAG:  R41.3 (ICD-10-CM) - Memory loss  THERAPY DIAG:  No diagnosis  found.  Rationale for Evaluation and Treatment: Habilitation  SUBJECTIVE:   SUBJECTIVE STATEMENT: "I left her clothes out but she didn't change them, she just put on her shoes"  PAIN:  Are you having pain? No   OBJECTIVE:   TODAY'S TREATMENT:  10-03-22: ***  09/05/22: Initiated trials of low tech AAC - [name] was able to choose family member from written choice of 3, She Id'd restaurants from choice of 5-6 to simple question/description. [name] generated list of family that they frequently interact with and restaurants they frequent. Initiated communication/memory book with family, restaurants and orders. Difficulty getting dressed and bathing - generated strategies of using written commands or numbered steps to cue [name], rather than [name] telling her what to do, then leaving the room. [name] will implement this and we will evaluate success or difficulties next session. [name] is taking 1-2 cups of applesauce to take 4 pills as pills get stuck in her cheek. I suggested asking about liquid meds for ease of oral manipulation. At this time, [name] demonstrate success with only written cues, however [name] will get photos of people and places to plan for progression of dementia to add to her communication/memory book. See patient instructions. They cancelled next 2 appointments as they are traveling for Thanksgiving   PATIENT EDUCATION: Education details: see above, see patient instructions Person educated: Patient and Caregiver   Education method: Medical illustrator, Handout Education comprehension: verbalized  understanding, returned demonstration, verbal cues required, and needs further education  GOALS: Goals reviewed with patient? Yes   SHORT TERM GOALS: Target date: 09/19/2022   Pt and caregiver will initiate generation of memory book, with A from SLP, to aid in orientation, recall and communication re: personally relevant places, names, trips Baseline: Goal status: ONGOING    2.  Pt and caregiver will add x4 pages to memory book with mod-I over 2 week period Baseline:  Goal status: ONGOING   3.  Pt will utilize low tech AAC device to communicate response to questions with usual mod-A in 50% of opportunities Baseline:  Goal status: ONGOING   4.  Care partner will demonstrate use of supportive communication strategies to aid in increased communication autonomy of pt's over 2 sessions Baseline:  Goal status: ONGOING   5.  Pt will complete clinical swallow evaluation             Baseline:             Goal status:ONGOING     LONG TERM GOALS: Target date: 10/17/2022   Caregivers/pt will use external memory aids or memory book to recall and communicate re: personally relevant places, names, trips etc over 4 sessions Baseline:  Goal status: ONGOING   2.  Pt and care partner will report carryover of low-tech AAC device usage at home, with benefit, over 1 week period Baseline:  Goal status: ONGOING   3.  Care partner will demonstration communication strategies to aid in completion of ADLs Baseline:  Goal status:ONGOING   4.  Care partner will report improvement in ability to complete ADLs (e.g. bathing) at home through carryover of communication strategies or aids over 1 week period  Baseline:  Goal status: ONGOING   5.  Care partner will teach back diet modifications recommended to aid in safe, ongoing PO administration of preferred foods and liquids             Baseline:             Goal status: ONGOING     ASSESSMENT:   CLINICAL IMPRESSION: [name] presents with impaired memory, orientation, attention, executive functioning, verbal expression, and receptive language. She had MCI dx since 2018. Repeat neuropsych testing in 2021 indicates Alzheimer's. Pt is currently requiring full time care. Significant cognitive impairment, coupled with communication impairment is impacting pt's interactions with caregivers. Primary concern this date,  verbalized by caregiver, sister [name], is pt's inability to answer questions or verbally express her thoughts, wants, needs. Ongoing training of caregiver [name] in  strategies to aid in communication and implement low tech AAC system to aid in autonomy of expression. Report occasional (1x monthly) instance of choking or coughing with solids. SLP will plan to complete clinical swallow evaluation at first visit and provide caregivers with education on diet strategies and modifications that may be implemented to aid in safe PO.    OBJECTIVE IMPAIRMENTS include attention, memory, awareness, executive functioning, expressive language, receptive language, and dysphagia. These impairments are limiting patient from ADLs/IADLs, effectively communicating at home and in community, and safety when swallowing. Factors affecting potential to achieve goals and functional outcome are ability to learn/carryover information, co-morbidities, medical prognosis, and severity of impairments Patient will benefit from skilled SLP services to address above impairments and improve overall function.    Maia Breslow, CCC-SLP 09/30/2022, 10:21 AM

## 2022-10-03 ENCOUNTER — Ambulatory Visit: Payer: Medicare PPO | Admitting: Speech Pathology

## 2022-10-10 ENCOUNTER — Ambulatory Visit: Payer: Medicare PPO | Attending: Physician Assistant | Admitting: Speech Pathology

## 2022-10-10 DIAGNOSIS — R41841 Cognitive communication deficit: Secondary | ICD-10-CM | POA: Diagnosis present

## 2022-10-10 NOTE — Therapy (Signed)
OUTPATIENT SPEECH LANGUAGE PATHOLOGY TREATMENT NOTE   Patient Name: [name] MRN: [id number] DOB:09-06-1951, 71 y.o., female Today's Date: 10/10/2022  PCP: Loralyn Freshwater MD REFERRING PROVIDER: Johnathan Hausen  END OF SESSION:   End of Session - 10/10/22 1048     Visit Number 3    Number of Visits 13    Date for SLP Re-Evaluation 10/17/22    Progress Note Due on Visit 10    SLP Start Time 1100    SLP Stop Time  1145    SLP Time Calculation (min) 45 min    Activity Tolerance Patient tolerated treatment well              Past Medical History:  Diagnosis Date   Acute sialoadenitis 02/28/2011   B submand    Altered consciousness 02/17/2017   4/18  transient impairment of awareness ?etiology x 10 min ASA qd Dr Tomi Likens   Alzheimer disease [facility])    CONTACT DERMATITIS&OTHER ECZEMA DUE TO PLANTS 02/13/2008   Qualifier: Diagnosis of  By: Ronnald Ramp MD, Arvid Right.    Depression    Hyperlipidemia    LBP (low back pain)    NONSPECIFIC ABNORMAL RESULTS LIVR FUNCTION STUDY 11/13/2007   Qualifier: Diagnosis of  By: Plotnikov MD, Evie Lacks    OA (osteoarthritis)    Paronychia of great toe of left foot 01/21/2013   3/14 The toenail is going to come off - likely s/p trauma 1 mo ago    Pneumonia due to organism 05/05/2014   7/15 R (clinical dx) 9/16 RML (CT)    Vitamin B 12 deficiency    Past Surgical History:  Procedure Laterality Date   CHOLECYSTECTOMY  1990   Patient Active Problem List   Diagnosis Date Noted   Late onset Alzheimer's disease with behavioral disturbance ([place]) 08/05/2021   Memory problem 06/12/2019   Alopecia areata 05/30/2014   Tendinopathy of left biceps tendon 10/04/2013   Trigger finger, acquired 10/04/2013   Left shoulder pain 09/03/2013   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   Onychomycosis of toenail 01/22/2013   IBS (irritable bowel syndrome) 09/09/2011   ABDOMINAL PAIN, LOWER 02/02/2009   Essential hypertension 09/26/2008   B12  deficiency 11/13/2007   Dyslipidemia 11/13/2007   LOW BACK PAIN 11/13/2007   Chronic depression 07/14/2007   OSTEOARTHRITIS 07/14/2007    ONSET DATE:  referral 08/15/22; onset of cognitive changes reported 2018         REFERRING DIAG:  R41.3 (ICD-10-CM) - Memory loss  THERAPY DIAG:  Cognitive communication deficit  Rationale for Evaluation and Treatment: Habilitation  SUBJECTIVE:   SUBJECTIVE STATEMENT: "sometimes I just don't know what to do"  PAIN:  Are you having pain? No   OBJECTIVE:   TODAY'S TREATMENT:  10-10-22: Report have not attempted written, simple instructions to aid in completion of daily tasks. Have not attempted to implement low tech AAC to aid in communication of wants/needs at home, but do endorse understanding how to. Caregiver able to teach back provide simple options and confirm choice to aid in communication of wants and needs. Report that biggest challenge at this time is [name] questioning when she gets to go home and how long someone needs to be with her. She will then get frustrated by responses. Education on simple responses such as "this is your home" and "as long as you need" to truthfully answer without upsetting [name]. Caregiver endorses will consider getting items from Lynn's home to make her bedroom more comforting. Education  on generating memory book: use single clear picture with simple, large print caption. Education and modeling on using old photos for reminiscent therapy. Provided options for Montessori based activities [name] can participate in to feel sense of purpose and accomplishment. Though not yet implemented, caregiver verbalizes she understands and feels comfortable implemented low tech AAC, memory book, and external aids without support of SLP at this time. Agreeable to ST d/c.   09/05/22: Initiated trials of low tech AAC - [name] was able to choose family member from written choice of 3, She Id'd restaurants from choice of 5-6 to simple  question/description. [name] generated list of family that they frequently interact with and restaurants they frequent. Initiated communication/memory book with family, restaurants and orders. Difficulty getting dressed and bathing - generated strategies of using written commands or numbered steps to cue [name], rather than [name] telling her what to do, then leaving the room. Lavell Luster will implement this and we will evaluate success or difficulties next session. [name] is taking 1-2 cups of applesauce to take 4 pills as pills get stuck in her cheek. I suggested asking about liquid meds for ease of oral manipulation. At this time, [name] demonstrate success with only written cues, however Lavell Luster will get photos of people and places to plan for progression of dementia to add to her communication/memory book. See patient instructions. They cancelled next 2 appointments as they are traveling for Thanksgiving   PATIENT EDUCATION: Education details: see above, see patient instructions Person educated: Patient and Caregiver   Education method: Customer service manager, Handout Education comprehension: verbalized understanding, returned demonstration, verbal cues required, and needs further education  GOALS: Goals reviewed with patient? Yes   SHORT TERM GOALS: Target date: 09/19/2022   Pt and caregiver will initiate generation of memory book, with A from SLP, to aid in orientation, recall and communication re: personally relevant places, names, trips Baseline: Goal status: not met   2.  Pt and caregiver will add x4 pages to memory book with mod-I over 2 week period Baseline:  Goal status: not met   3.  Pt will utilize low tech AAC device to communicate response to questions with usual mod-A in 50% of opportunities Baseline:  Goal status: not met   4.  Care partner will demonstrate use of supportive communication strategies to aid in increased communication autonomy of pt's over 2 sessions Baseline:  10-10-22 Goal status: met   5.  Pt will complete clinical swallow evaluation             Baseline:             Goal status: deferred, per caregiver report no concerns eating/drinking at this time     LONG TERM GOALS: Target date: 10/17/2022   Caregivers/pt will use external memory aids or memory book to recall and communicate re: personally relevant places, names, trips etc over 4 sessions Baseline:  Goal status: not met   2.  Pt and care partner will report carryover of low-tech AAC device usage at home, with benefit, over 1 week period Baseline:  Goal status: not met   3.  Care partner will demonstration communication strategies to aid in completion of ADLs Baseline:  Goal status: not met   4.  Care partner will report improvement in ability to complete ADLs (e.g. bathing) at home through carryover of communication strategies or aids over 1 week period  Baseline:  Goal status: not met   5.  Care partner will teach back diet modifications  recommended to aid in safe, ongoing PO administration of preferred foods and liquids             Baseline:             Goal status: not met     ASSESSMENT:   CLINICAL IMPRESSION: [name] continues to present with impaired cognition 2/2 Alzheimer's, requiring full time care. Ongoing concerns, verbalized by caregiver, sister Deborah Chalk, is pt's inability to answer questions or verbally express her thoughts, wants, needs and impaired orientation to situation. SLP initiated training of caregiver Lavell Luster in  strategies to aid in communication and implement low tech AAC system to aid in autonomy of expression. Goals not met d/t only 2 ST visits since initial eval but caregiver feels confident in ability to carryover education provided. Will return for subsequent ST course should needs change.    OBJECTIVE IMPAIRMENTS include attention, memory, awareness, executive functioning, expressive language, receptive language, and dysphagia. These impairments are  limiting patient from ADLs/IADLs, effectively communicating at home and in community, and safety when swallowing. Factors affecting potential to achieve goals and functional outcome are ability to learn/carryover information, co-morbidities, medical prognosis, and severity of impairments Patient will benefit from skilled SLP services to address above impairments and improve overall function.   SPEECH THERAPY DISCHARGE SUMMARY  Visits from Start of Care: 3  Current functional level related to goals / functional outcomes: Caregiver with increased understanding of supportive communication techniques, potential benefits of low tech AAC, written simple instructions, and memory book.    Remaining deficits: Impaired cognition, impaired expressive Agricultural engineer / Equipment: Supportive communication, low tech AAC, memory book design and use, activities which may benefit Lynn's QoL   Patient agrees to discharge. Patient goals were not met. Patient is being discharged due to the patient's request.   Su Monks, Latta 10/10/2022, 10:48 AM

## 2022-11-28 ENCOUNTER — Ambulatory Visit: Payer: Medicare PPO | Admitting: Podiatry

## 2022-11-28 ENCOUNTER — Encounter: Payer: Self-pay | Admitting: Podiatry

## 2022-11-28 VITALS — BP 128/58

## 2022-11-28 DIAGNOSIS — M79676 Pain in unspecified toe(s): Secondary | ICD-10-CM

## 2022-11-28 DIAGNOSIS — B351 Tinea unguium: Secondary | ICD-10-CM

## 2022-11-28 NOTE — Progress Notes (Signed)
  Subjective:  Patient ID: [name], female    DOB: 1950/11/04,  MRN: [id number]  [name] presents to clinic today for painful elongated mycotic toenails 1-5 bilaterally which are tender when wearing enclosed shoe gear. Pain is relieved with periodic professional debridement.  Chief Complaint  Patient presents with   Nail Problem    RFC Micheline Chapman PCP VST-07/2022   New problem(s): None.   Patient is accompanied by her sister, [name], on today's visit.  PCP is Farrel Conners, MD.  Allergies  Allergen Reactions   Erythromycin Base Nausea Only   Other Nausea And Vomiting and Other (See Comments)    Mycins-dizziness   Review of Systems: Negative except as noted in the HPI.  Objective: No changes noted in today's physical examination. Vitals:   11/28/22 1053  BP: (!) 128/58   [name] is a pleasant 72 y.o. female WD, WN in NAD. AAO x 3.  Vascular Examination: Capillary refill time immediate b/l.Vascular status intact b/l with palpable pedal pulses. Pedal hair present b/l. No edema. No pain with calf compression b/l. Skin temperature gradient WNL b/l.   Neurological Examination: Sensation grossly intact b/l with 10 gram monofilament. Vibratory sensation intact b/l.   Dermatological Examination: Pedal skin with normal turgor, texture and tone b/l. Toenails 1-5 b/l thick, discolored, elongated with subungual debris and pain on dorsal palpation.   Hyperkeratotic lesion(s) medial IPJ of left great toe, medial IPJ of right great toe, and submet head 1 right foot.  No erythema, no edema, no drainage, no fluctuance.  Musculoskeletal Examination: Normal muscle strength 5/5 to all lower extremity muscle groups bilaterally. No pain, crepitus or joint limitation noted with ROM b/l LE. No gross bony pedal deformities b/l. Patient ambulates independently without assistive aids.  Radiographs: None  Assessment/Plan: 1. Pain due to onychomycosis of  toenail     -Patient's family member present. All questions/concerns addressed on today's visit. -Will submit preauthorization to insurance company for paring of calluses. -Continue supportive shoe gear daily. -Toenails 1-5 b/l were debrided in length and girth with sterile nail nippers and dremel without iatrogenic bleeding.  -As a courtesy, callus(es) bilateral great toes and submet head 1 right foot pared utilizing sterile scalpel blade without complication or incident. Total number pared=3. -Patient/POA to call should there be question/concern in the interim.   Return in about 3 months (around 02/26/2023).  Marzetta Board, DPM

## 2022-12-12 ENCOUNTER — Ambulatory Visit
Admission: RE | Admit: 2022-12-12 | Discharge: 2022-12-12 | Disposition: A | Discharge status: Home / Self Care | Payer: Medicare PPO | Source: Ambulatory Visit | Attending: Family Medicine | Admitting: Family Medicine

## 2022-12-12 DIAGNOSIS — Z780 Asymptomatic menopausal state: Secondary | ICD-10-CM

## 2022-12-17 ENCOUNTER — Other Ambulatory Visit: Payer: Self-pay | Admitting: Family Medicine

## 2022-12-17 DIAGNOSIS — F32A Depression, unspecified: Secondary | ICD-10-CM

## 2022-12-17 DIAGNOSIS — G301 Alzheimer's disease with late onset: Secondary | ICD-10-CM

## 2022-12-22 ENCOUNTER — Other Ambulatory Visit (INDEPENDENT_AMBULATORY_CARE_PROVIDER_SITE_OTHER): Payer: Medicare PPO

## 2022-12-22 DIAGNOSIS — E559 Vitamin D deficiency, unspecified: Secondary | ICD-10-CM | POA: Diagnosis not present

## 2022-12-22 DIAGNOSIS — I10 Essential (primary) hypertension: Secondary | ICD-10-CM | POA: Diagnosis not present

## 2022-12-22 LAB — COMPREHENSIVE METABOLIC PANEL
ALT: 12 U/L (ref 0–35)
AST: 16 U/L (ref 0–37)
Albumin: 3.8 g/dL (ref 3.5–5.2)
Alkaline Phosphatase: 70 U/L (ref 39–117)
BUN: 11 mg/dL (ref 6–23)
CO2: 24 mEq/L (ref 19–32)
Calcium: 9.5 mg/dL (ref 8.4–10.5)
Chloride: 111 mEq/L (ref 96–112)
Creatinine, Ser: 0.89 mg/dL (ref 0.40–1.20)
GFR: 65.27 mL/min (ref >60.00)
Glucose, Bld: 83 mg/dL (ref 70–99)
Potassium: 4 mEq/L (ref 3.5–5.1)
Sodium: 143 mEq/L (ref 135–145)
Total Bilirubin: 0.7 mg/dL (ref 0.2–1.2)
Total Protein: 6.9 g/dL (ref 6.0–8.3)

## 2022-12-22 LAB — LIPID PANEL
Cholesterol: 183 mg/dL (ref 0–200)
HDL: 51.5 mg/dL (ref >39.00)
LDL Cholesterol: 112 mg/dL — ABNORMAL HIGH (ref 0–99)
NonHDL: 131.92
Total CHOL/HDL Ratio: 4
Triglycerides: 100 mg/dL (ref 0.0–149.0)
VLDL: 20 mg/dL (ref 0.0–40.0)

## 2022-12-22 LAB — VITAMIN D 25 HYDROXY (VIT D DEFICIENCY, FRACTURES): VITD: 14.8 ng/mL — ABNORMAL LOW (ref 30.00–100.00)

## 2022-12-22 MED ORDER — VITAMIN D (ERGOCALCIFEROL) 1.25 MG (50000 UNIT) PO CAPS: 50000.0000 [IU] | ORAL_CAPSULE | ORAL | 1 refills | Status: DC

## 2023-01-09 ENCOUNTER — Emergency Department (HOSPITAL_COMMUNITY): Payer: Medicare PPO

## 2023-01-09 ENCOUNTER — Emergency Department (HOSPITAL_COMMUNITY)
Admission: EM | Admit: 2023-01-09 | Discharge: 2023-01-09 | Disposition: A | Discharge status: Home / Self Care | Payer: Medicare PPO | Attending: Emergency Medicine | Admitting: Emergency Medicine

## 2023-01-09 ENCOUNTER — Encounter (HOSPITAL_COMMUNITY): Payer: Self-pay

## 2023-01-09 ENCOUNTER — Other Ambulatory Visit: Payer: Self-pay

## 2023-01-09 DIAGNOSIS — R339 Retention of urine, unspecified: Secondary | ICD-10-CM | POA: Insufficient documentation

## 2023-01-09 DIAGNOSIS — R197 Diarrhea, unspecified: Secondary | ICD-10-CM | POA: Diagnosis not present

## 2023-01-09 DIAGNOSIS — R112 Nausea with vomiting, unspecified: Secondary | ICD-10-CM | POA: Diagnosis not present

## 2023-01-09 DIAGNOSIS — F0390 Unspecified dementia without behavioral disturbance: Secondary | ICD-10-CM | POA: Diagnosis not present

## 2023-01-09 LAB — URINALYSIS, W/ REFLEX TO CULTURE (INFECTION SUSPECTED)
Bacteria, UA: NONE SEEN
Bilirubin Urine: NEGATIVE
Glucose, UA: NEGATIVE mg/dL
Hgb urine dipstick: NEGATIVE
Ketones, ur: 5 mg/dL — AB
Leukocytes,Ua: NEGATIVE
Nitrite: NEGATIVE
Protein, ur: NEGATIVE mg/dL
Specific Gravity, Urine: 1.021 (ref 1.005–1.030)
pH: 5 (ref 5.0–8.0)

## 2023-01-09 LAB — CBC WITH DIFFERENTIAL/PLATELET
Abs Immature Granulocytes: 0.06 10*3/uL (ref 0.00–0.07)
Basophils Absolute: 0.1 10*3/uL (ref 0.0–0.1)
Basophils Relative: 0 %
Eosinophils Absolute: 0 10*3/uL (ref 0.0–0.5)
Eosinophils Relative: 0 %
HCT: 39.4 % (ref 36.0–46.0)
Hemoglobin: 14 g/dL (ref 12.0–15.0)
Immature Granulocytes: 1 %
Lymphocytes Relative: 10 %
Lymphs Abs: 1.2 10*3/uL (ref 0.7–4.0)
MCH: 34.3 pg — ABNORMAL HIGH (ref 26.0–34.0)
MCHC: 35.5 g/dL (ref 30.0–36.0)
MCV: 96.6 fL (ref 80.0–100.0)
Monocytes Absolute: 0.7 10*3/uL (ref 0.1–1.0)
Monocytes Relative: 6 %
Neutro Abs: 10.5 10*3/uL — ABNORMAL HIGH (ref 1.7–7.7)
Neutrophils Relative %: 83 %
Platelets: 271 10*3/uL (ref 150–400)
RBC: 4.08 MIL/uL (ref 3.87–5.11)
RDW: 12.1 % (ref 11.5–15.5)
WBC: 12.5 10*3/uL — ABNORMAL HIGH (ref 4.0–10.5)
nRBC: 0 % (ref 0.0–0.2)

## 2023-01-09 LAB — COMPREHENSIVE METABOLIC PANEL
ALT: 18 U/L (ref 0–44)
AST: 45 U/L — ABNORMAL HIGH (ref 15–41)
Albumin: 4 g/dL (ref 3.5–5.0)
Alkaline Phosphatase: 74 U/L (ref 38–126)
Anion gap: 10 (ref 5–15)
BUN: 22 mg/dL (ref 8–23)
CO2: 21 mmol/L — ABNORMAL LOW (ref 22–32)
Calcium: 9.3 mg/dL (ref 8.9–10.3)
Chloride: 108 mmol/L (ref 98–111)
Creatinine, Ser: 0.94 mg/dL (ref 0.44–1.00)
GFR, Estimated: >60 mL/min (ref >60)
Glucose, Bld: 114 mg/dL — ABNORMAL HIGH (ref 70–99)
Potassium: 4 mmol/L (ref 3.5–5.1)
Sodium: 139 mmol/L (ref 135–145)
Total Bilirubin: 0.9 mg/dL (ref 0.3–1.2)
Total Protein: 7.8 g/dL (ref 6.5–8.1)

## 2023-01-09 LAB — LIPASE, BLOOD: Lipase: 31 U/L (ref 11–51)

## 2023-01-09 MED ORDER — ONDANSETRON HCL 4 MG/2ML IJ SOLN
4.0000 mg | Freq: Once | INTRAMUSCULAR | Status: AC
  Administered 2023-01-09: 4 mg via INTRAVENOUS
  Filled 2023-01-09: qty 2

## 2023-01-09 MED ORDER — ONDANSETRON 4 MG PO TBDP: 4.0000 mg | ORAL_TABLET | Freq: Three times a day (TID) | ORAL | 0 refills | Status: DC | PRN

## 2023-01-09 MED ORDER — SODIUM CHLORIDE 0.9 % IV BOLUS
1000.0000 mL | Freq: Once | INTRAVENOUS | Status: AC
  Administered 2023-01-09: 1000 mL via INTRAVENOUS

## 2023-01-09 NOTE — ED Notes (Signed)
Post void residual 38mL

## 2023-01-09 NOTE — ED Notes (Signed)
Bladder Scan 458mL

## 2023-01-09 NOTE — ED Provider Notes (Signed)
[facility] Provider Note   CSN: [id number] Arrival date & time: 01/09/23  1045     History  Chief Complaint  Patient presents with   Vomiting    [name] is a 72 y.o. female.  HPI Patient presents with nausea vomiting.  Some diarrhea yesterday and today vomited 3-4 times.  Patient has dementia and cannot really provide much history but states she does not hurt anywhere.  No fevers.  History comes from family member.   Past Medical History:  Diagnosis Date   Acute sialoadenitis 02/28/2011   B submand    Altered consciousness 02/17/2017   4/18  transient impairment of awareness ?etiology x 10 min ASA qd Dr Tomi Likens   Alzheimer disease [facility])    CONTACT DERMATITIS&OTHER ECZEMA DUE TO PLANTS 02/13/2008   Qualifier: Diagnosis of  By: Ronnald Ramp MD, Arvid Right.    Depression    Hyperlipidemia    LBP (low back pain)    NONSPECIFIC ABNORMAL RESULTS LIVR FUNCTION STUDY 11/13/2007   Qualifier: Diagnosis of  By: Plotnikov MD, Evie Lacks    OA (osteoarthritis)    Paronychia of great toe of left foot 01/21/2013   3/14 The toenail is going to come off - likely s/p trauma 1 mo ago    Pneumonia due to organism 05/05/2014   7/15 R (clinical dx) 9/16 RML (CT)    Vitamin B 12 deficiency    Past Surgical History:  Procedure Laterality Date   CHOLECYSTECTOMY  1990    Home Medications Prior to Admission medications   Medication Sig Start Date End Date Taking? Authorizing Provider  Cholecalciferol 1000 UNITS capsule Take 1,000 Units by mouth daily.   Yes [provider]  donepezil (ARICEPT) 10 MG tablet TAKE 1 TABLET BY MOUTH EVERYDAY AT BEDTIME 12/19/22  Yes Farrel Conners, MD  memantine (NAMENDA) 10 MG tablet TAKE 1 TABLET BY MOUTH TWICE A DAY 12/19/22  Yes Farrel Conners, MD  QUEtiapine (SEROQUEL) 50 MG tablet Take 1 tablet (50 mg total) by mouth at bedtime. 09/21/22  Yes Farrel Conners, MD  sertraline (ZOLOFT) 50 MG tablet TAKE 1  TABLET BY MOUTH EVERY DAY 12/19/22  Yes Farrel Conners, MD  Vitamin D, Ergocalciferol, (DRISDOL) 1.25 MG (50000 UNIT) CAPS capsule Take 1 capsule (50,000 Units total) by mouth every 7 (seven) days. 12/22/22  Yes Farrel Conners, MD      Allergies    Erythromycin base and Other    Review of Systems   Review of Systems  Physical Exam Updated Vital Signs BP 135/74   Pulse 66   Temp (!) 97.4 F (36.3 C) (Temporal)   Resp 20   Ht 5\' 2"  (1.575 m)   Wt 62.6 kg   SpO2 97%   BMI 25.24 kg/m  Physical Exam Vitals and nursing note reviewed.  HENT:     Head: Atraumatic.  Eyes:     Extraocular Movements: Extraocular movements intact.  Cardiovascular:     Rate and Rhythm: Normal rate.  Abdominal:     Comments: No real distention.  Potentially mild tenderness but not localizing.  Musculoskeletal:        General: No tenderness.  Skin:    General: Skin is warm.     Capillary Refill: Capillary refill takes less than 2 seconds.  Neurological:     Mental Status: She is alert. Mental status is at baseline.     ED Results / Procedures / Treatments  Labs (all labs ordered are listed, but only abnormal results are displayed) Labs Reviewed  COMPREHENSIVE METABOLIC PANEL - Abnormal; Notable for the following components:      Result Value   CO2 21 (*)    Glucose, Bld 114 (*)    AST 45 (*)    All other components within normal limits  CBC WITH DIFFERENTIAL/PLATELET - Abnormal; Notable for the following components:   WBC 12.5 (*)    MCH 34.3 (*)    Neutro Abs 10.5 (*)    All other components within normal limits  URINALYSIS, W/ REFLEX TO CULTURE (INFECTION SUSPECTED) - Abnormal; Notable for the following components:   Ketones, ur 5 (*)    All other components within normal limits  LIPASE, BLOOD    EKG None  Radiology CT Head Wo Contrast  Result Date: 01/09/2023 CLINICAL DATA:  Mental status change, unknown cause. EXAM: CT HEAD WITHOUT CONTRAST TECHNIQUE: Contiguous axial  images were obtained from the base of the skull through the vertex without intravenous contrast. RADIATION DOSE REDUCTION: This exam was performed according to the departmental dose-optimization program which includes automated exposure control, adjustment of the mA and/or kV according to patient size and/or use of iterative reconstruction technique. COMPARISON:  Head CT 04/12/2020. FINDINGS: Brain: No acute intracranial hemorrhage. Interval progression of chronic small-vessel disease and volume loss, now moderate with temporal predominance. No hydrocephalus or extra-axial collection. No mass effect or midline shift. Vascular: No hyperdense vessel or unexpected calcification. Skull: No calvarial fracture or suspicious bone lesion. Skull base is unremarkable. Sinuses/Orbits: Unremarkable. Other: None. IMPRESSION: 1. No acute intracranial hemorrhage or evidence of evolving large vessel territory infarct. 2. Interval progression of chronic small-vessel disease and volume loss, now moderate with temporal predominance. Electronically Signed   By: Emmit Alexanders M.D.   On: 01/09/2023 14:52   DG Abdomen 1 View  Result Date: 01/09/2023 CLINICAL DATA:  Vomiting EXAM: ABDOMEN - 1 VIEW COMPARISON:  None Available. FINDINGS: The bowel gas pattern is normal. No radio-opaque calculi or other significant radiographic abnormality are seen. No acute bony abnormality. IMPRESSION: Negative. Electronically Signed   By: Davina Poke D.O.   On: 01/09/2023 13:01    Procedures Procedures    Medications Ordered in ED Medications  ondansetron (ZOFRAN) injection 4 mg (4 mg Intravenous Given 01/09/23 1225)  sodium chloride 0.9 % bolus 1,000 mL (1,000 mLs Intravenous New Bag/Given 01/09/23 1224)    ED Course/ Medical Decision Making/ A&P                             Medical Decision Making Amount and/or Complexity of Data Reviewed Labs: ordered. Radiology: ordered.  Risk Prescription drug management.   Patient  with nausea vomiting a little diarrhea.  Potentially more confusion but does have baseline dementia.  Will get basic blood work.  Will get 1 view abdomen evaluate for obstruction.  Will also get urinalysis.  Family ember states she has not urinated today.  Differential diagnose includes obstruction, gastroenteritis, urinary tract infection. X-ray reassuring.  Benign abdominal exam overall.  No mass clearly palpated.  Minimal tenderness.  Postvoid residual more than 400 however.  Has had decreased urination.  Will attempt Foley catheter however with dementia will see how she tolerates it.  Urine does not show infection.  Will give oral trial.  If improved should be able to follow-up as an outpatient with urology and PCP.  Doubt severe intra-abdominal infection.  However with  dementia worsening recently head CT done.  Reportedly has had more unsteadiness.  Head CT reassuring.  Will get transitional care involved for more help at home.  Lives with her daughter who states she could use some help.   Care turned over to Dr Sabra Heck.       Final Clinical Impression(s) / ED Diagnoses Final diagnoses:  Nausea vomiting and diarrhea  Urinary retention  Dementia, unspecified dementia severity, unspecified dementia type, unspecified whether behavioral, psychotic, or mood disturbance or anxiety [facility])    Rx / DC Orders ED Discharge Orders     None         Davonna Belling, MD 01/09/23 [phone]

## 2023-01-09 NOTE — ED Triage Notes (Signed)
Pt sister reports pt had diarrhea yesterday and has vomited about 3-4 times today and says her right side abd hurts.

## 2023-01-09 NOTE — Discharge Instructions (Signed)
Follow-up with the urologist office above, make the phone call in the morning to follow-up.  Nausea medication including Zofran was given here, it has been prescribed for home, you may take 1 tablet every 8 hours as needed for nausea or vomiting  Thank you for allowing [place] to treat you in the emergency department today.  After reviewing your examination and potential testing that was done it appears that you are safe to go home.  I would like for you to follow-up with your doctor within the next several days, have them obtain your results and follow-up with them to review all of these tests.  If you should develop severe or worsening symptoms return to the emergency department immediately

## 2023-01-09 NOTE — ED Provider Notes (Signed)
Foley to be placed  Family has questions which I was able to answer, home health has been prescribed and requested for home, they will make a home visit  Transition of care has requested this and has given this advice.  Family member at the bedside feels comfortable taking the patient home and following up with urology for Foley catheter.  No signs of urinary tract infection   Noemi Chapel, MD 01/09/23 [phone]

## 2023-01-09 NOTE — ED Notes (Signed)
CSW spoke with pts niece who is listed as first contact. Pt lives with her sister [name]. Pts niece states that pt would benefit from [facility] services and they do not have agency preference. CSW spoke to [name] with [place] who accepted [facility] PT/RN/Aide referral. CSW requested that MD place [facility] orders. CSW to add [facility] agency information to pts AVS. TOC signing off.

## 2023-01-13 ENCOUNTER — Telehealth: Payer: Self-pay | Admitting: Family Medicine

## 2023-01-13 ENCOUNTER — Ambulatory Visit (INDEPENDENT_AMBULATORY_CARE_PROVIDER_SITE_OTHER): Payer: Medicare PPO | Admitting: Family Medicine

## 2023-01-13 ENCOUNTER — Encounter: Payer: Self-pay | Admitting: Family Medicine

## 2023-01-13 VITALS — BP 120/80 | HR 70 | Temp 97.6°F | Ht 62.0 in | Wt 133.9 lb

## 2023-01-13 DIAGNOSIS — R112 Nausea with vomiting, unspecified: Secondary | ICD-10-CM

## 2023-01-13 DIAGNOSIS — F02818 Dementia in other diseases classified elsewhere, unspecified severity, with other behavioral disturbance: Secondary | ICD-10-CM | POA: Diagnosis not present

## 2023-01-13 DIAGNOSIS — G301 Alzheimer's disease with late onset: Secondary | ICD-10-CM | POA: Diagnosis not present

## 2023-01-13 MED ORDER — ONDANSETRON 4 MG PO TBDP: 4.0000 mg | ORAL_TABLET | Freq: Three times a day (TID) | ORAL | 0 refills | Status: DC | PRN

## 2023-01-13 MED ORDER — QUETIAPINE FUMARATE 50 MG PO TABS: 25.0000 mg | ORAL_TABLET | Freq: Every day | ORAL | 1 refills | Status: DC

## 2023-01-13 NOTE — Telephone Encounter (Signed)
skilled nursing 1x4 then 1 every other week for 4 weeks and 1 prn

## 2023-01-13 NOTE — Telephone Encounter (Signed)
Ok to send verbal order

## 2023-01-13 NOTE — Progress Notes (Unsigned)
   Established Patient Office Visit  Subjective   Patient ID: [name], female    DOB: Dec 08, 1950  Age: 72 y.o. MRN: [id number]  Chief Complaint  Patient presents with   Follow-up    Pt is here for ER follow up. She went to the ER on 01/09/23 with vomiting and diarrhea. Family member is here is visit, pt is a poor historian so the family member gives the history. States that she still feels a little weak.  She is eating and drinking now, bowel movements have stopped. No abdominal pain,  she is no longer vomiting. I reviewed her blood work and her abdominal film from the ER. Her UA was negative for infection. States that she has home health for physical therapy and a nurse coming in once a week also.    Current Outpatient Medications  Medication Instructions   Cholecalciferol 1,000 Units, Oral, Daily   donepezil (ARICEPT) 10 MG tablet TAKE 1 TABLET BY MOUTH EVERYDAY AT BEDTIME   memantine (NAMENDA) 10 mg, Oral, 2 times daily   ondansetron (ZOFRAN-ODT) 4 mg, Oral, Every 8 hours PRN   QUEtiapine (SEROQUEL) 25 mg, Oral, Daily at bedtime, If the 25 mg is not effective, ok to increase t o50 mg (1 tablet) at bedtime.   sertraline (ZOLOFT) 50 mg, Oral, Daily   Vitamin D (Ergocalciferol) (DRISDOL) 50,000 Units, Oral, Every 7 days    {History (Optional):23778}  ROS    Objective:     BP 120/80 (BP Location: Left Arm, Patient Position: Sitting, Cuff Size: Normal)   Pulse 70   Temp 97.6 F (36.4 C) (Oral)   Ht 5\' 2"  (1.575 m)   Wt 133 lb 14.4 oz (60.7 kg)   SpO2 98%   BMI 24.49 kg/m  {Vitals History (Optional):23777}  Physical Exam   No results found for any visits on 01/13/23.  {Labs (Optional):23779}  The 10-year ASCVD risk score (Arnett DK, et al., 2019) is: 9.2%    Assessment & Plan:   Problem List Items Addressed This Visit       Unprioritized   Late onset Alzheimer's disease with behavioral disturbance (HCC)   Relevant Medications   QUEtiapine (SEROQUEL) 50  MG tablet   Other Visit Diagnoses     Nausea and vomiting, unspecified vomiting type    -  Primary   Relevant Medications   ondansetron (ZOFRAN-ODT) 4 MG disintegrating tablet       No follow-ups on file.    Farrel Conners, MD

## 2023-01-13 NOTE — Telephone Encounter (Signed)
Left a detailed message on [facility]' voicemail with the approval for orders as below.

## 2023-01-16 ENCOUNTER — Telehealth: Payer: Self-pay | Admitting: Family Medicine

## 2023-01-16 NOTE — Telephone Encounter (Signed)
[name] PT with [facility] to leave a detailed message on this line  Verbal Order - PT 2 wk 4 1 wk 4  Would also like to request: Home Health Aide order, just once a week, if possible   FYI: Pt had a fall today, but did not get injured

## 2023-01-17 NOTE — Telephone Encounter (Signed)
Please disregard

## 2023-01-17 NOTE — Progress Notes (Unsigned)
ACUTE VISIT No chief complaint on file.  HPI: [name] is a 72 y.o. female, who is here today complaining of *** HPI  Review of Systems See other pertinent positives and negatives in HPI.  Current Outpatient Medications on File Prior to Visit  Medication Sig Dispense Refill   Cholecalciferol 1000 UNITS capsule Take 1,000 Units by mouth daily.     donepezil (ARICEPT) 10 MG tablet TAKE 1 TABLET BY MOUTH EVERYDAY AT BEDTIME 90 tablet 1   memantine (NAMENDA) 10 MG tablet TAKE 1 TABLET BY MOUTH TWICE A DAY 180 tablet 1   ondansetron (ZOFRAN-ODT) 4 MG disintegrating tablet Take 1 tablet (4 mg total) by mouth every 8 (eight) hours as needed for nausea or vomiting. 20 tablet 0   QUEtiapine (SEROQUEL) 50 MG tablet Take 0.5 tablets (25 mg total) by mouth at bedtime. If the 25 mg is not effective, ok to increase t o50 mg (1 tablet) at bedtime. 90 tablet 1   sertraline (ZOLOFT) 50 MG tablet TAKE 1 TABLET BY MOUTH EVERY DAY 90 tablet 1   Vitamin D, Ergocalciferol, (DRISDOL) 1.25 MG (50000 UNIT) CAPS capsule Take 1 capsule (50,000 Units total) by mouth every 7 (seven) days. 12 capsule 1   No current facility-administered medications on file prior to visit.    Past Medical History:  Diagnosis Date   Acute sialoadenitis 02/28/2011   B submand    Altered consciousness 02/17/2017   4/18  transient impairment of awareness ?etiology x 10 min ASA qd Dr Tomi Likens   Alzheimer disease [facility])    CONTACT DERMATITIS&OTHER ECZEMA DUE TO PLANTS 02/13/2008   Qualifier: Diagnosis of  By: Ronnald Ramp MD, Arvid Right.    Depression    Hyperlipidemia    LBP (low back pain)    NONSPECIFIC ABNORMAL RESULTS LIVR FUNCTION STUDY 11/13/2007   Qualifier: Diagnosis of  By: Plotnikov MD, Evie Lacks    OA (osteoarthritis)    Paronychia of great toe of left foot 01/21/2013   3/14 The toenail is going to come off - likely s/p trauma 1 mo ago    Pneumonia due to organism 05/05/2014   7/15 R (clinical dx) 9/16 RML (CT)     Vitamin B 12 deficiency    Allergies  Allergen Reactions   Erythromycin Base Nausea Only    Other Reaction(s): GI Intolerance   Other Nausea And Vomiting and Other (See Comments)    Mycins-dizziness    Social History   Socioeconomic History   Marital status: Single    Spouse name: Not on file   Number of children: 0   Years of education: 12   Highest education level: Not on file  Occupational History   Occupation: retired  Tobacco Use   Smoking status: Never   Smokeless tobacco: Never  Vaping Use   Vaping Use: Never used  Substance and Sexual Activity   Alcohol use: No   Drug use: No   Sexual activity: Not Currently  Other Topics Concern   Not on file  Social History Narrative   Lives with sister in a 1 1/2 story home.  No children.  Retired from [employer].  Education: high school.  Right handed   Drinks caffeine on occasion   Social Determinants of Health   Financial Resource Strain: Low Risk  (09/20/2022)   Overall Financial Resource Strain (CARDIA)    Difficulty of Paying Living Expenses: Not hard at all  Food Insecurity: No Food Insecurity (09/20/2022)   Hunger Vital Sign  Worried About Charity fundraiser in the Last Year: Never true    [place]ins in the Last Year: Never true  Transportation Needs: No Transportation Needs (09/20/2022)   PRAPARE - Hydrologist (Medical): No    Lack of Transportation (Non-Medical): No  Physical Activity: Inactive (09/20/2022)   Exercise Vital Sign    Days of Exercise per Week: 0 days    Minutes of Exercise per Session: 0 min  Stress: No Stress Concern Present (09/20/2022)   [place]    Feeling of Stress : Not at all  Social Connections: Moderately Integrated (09/20/2022)   Social Connection and Isolation Panel [NHANES]    Frequency of Communication with Friends and Family: More than three times a week    Frequency of  Social Gatherings with Friends and Family: More than three times a week    Attends Religious Services: More than 4 times per year    Active Member of [employer] or Organizations: Yes    Attends Music therapist: More than 4 times per year    Marital Status: Never married    There were no vitals filed for this visit. There is no height or weight on file to calculate BMI.  Physical Exam  ASSESSMENT AND PLAN: There are no diagnoses linked to this encounter.  No follow-ups on file.  Sheridan Gettel G. [place], MD  [facility]. [place] office.  Discharge Instructions   None

## 2023-01-18 ENCOUNTER — Telehealth: Payer: Self-pay

## 2023-01-18 ENCOUNTER — Ambulatory Visit (INDEPENDENT_AMBULATORY_CARE_PROVIDER_SITE_OTHER): Payer: Medicare PPO | Admitting: Family Medicine

## 2023-01-18 ENCOUNTER — Other Ambulatory Visit: Payer: Medicare PPO

## 2023-01-18 ENCOUNTER — Encounter: Payer: Self-pay | Admitting: Family Medicine

## 2023-01-18 VITALS — BP 128/80 | HR 73 | Resp 16 | Ht 62.0 in | Wt 129.1 lb

## 2023-01-18 DIAGNOSIS — D72829 Elevated white blood cell count, unspecified: Secondary | ICD-10-CM

## 2023-01-18 DIAGNOSIS — G301 Alzheimer's disease with late onset: Secondary | ICD-10-CM

## 2023-01-18 DIAGNOSIS — W19XXXA Unspecified fall, initial encounter: Secondary | ICD-10-CM | POA: Diagnosis not present

## 2023-01-18 DIAGNOSIS — F02818 Dementia in other diseases classified elsewhere, unspecified severity, with other behavioral disturbance: Secondary | ICD-10-CM

## 2023-01-18 DIAGNOSIS — Y92009 Unspecified place in unspecified non-institutional (private) residence as the place of occurrence of the external cause: Secondary | ICD-10-CM

## 2023-01-18 DIAGNOSIS — S8992XA Unspecified injury of left lower leg, initial encounter: Secondary | ICD-10-CM | POA: Diagnosis not present

## 2023-01-18 DIAGNOSIS — R053 Chronic cough: Secondary | ICD-10-CM

## 2023-01-18 NOTE — Telephone Encounter (Signed)
Patient was in for an acute visit. Patient hasn't been able to get the Seroquel from [facility]. Please follow up.

## 2023-01-18 NOTE — Assessment & Plan Note (Signed)
Was unable to get the 25 mg seroquel tablets, will rx the 50 mg tablets and have her cut them in half. Start with 1/2 tablet at bedtime for insomnia and agitation, will increase to 50 mg at night if needed.

## 2023-01-18 NOTE — Patient Instructions (Addendum)
A few things to remember from today's visit:  Fall in home, initial encounter  Leukocytosis, unspecified type - Plan: CBC  Injury of left knee, initial encounter - Plan: DG Knee Complete 4 Views Left  Late onset Alzheimer's disease with behavioral disturbance ([place])  Fall precautions. Referral to social worker placed. Tomorrow we can repeat labs as requested and do a knee X ray.  If you need refills for medications you take chronically, please call your pharmacy. Do not use My Chart to request refills or for acute issues that need immediate attention. If you send a my chart message, it may take a few days to be addressed, specially if I am not in the office.  Please be sure medication list is accurate. If a new problem present, please set up appointment sooner than planned today.

## 2023-01-18 NOTE — Telephone Encounter (Signed)
Left a message on Stacey's voicemail with the approval for orders as below.

## 2023-01-18 NOTE — Telephone Encounter (Signed)
Ok to send verbal orders

## 2023-01-19 ENCOUNTER — Other Ambulatory Visit (INDEPENDENT_AMBULATORY_CARE_PROVIDER_SITE_OTHER): Payer: Medicare PPO

## 2023-01-19 ENCOUNTER — Ambulatory Visit (INDEPENDENT_AMBULATORY_CARE_PROVIDER_SITE_OTHER): Payer: Medicare PPO

## 2023-01-19 DIAGNOSIS — R053 Chronic cough: Secondary | ICD-10-CM | POA: Diagnosis not present

## 2023-01-19 DIAGNOSIS — D72829 Elevated white blood cell count, unspecified: Secondary | ICD-10-CM | POA: Diagnosis not present

## 2023-01-19 DIAGNOSIS — S8992XA Unspecified injury of left lower leg, initial encounter: Secondary | ICD-10-CM | POA: Diagnosis not present

## 2023-01-19 LAB — CBC
HCT: 39.1 % (ref 36.0–46.0)
Hemoglobin: 13.1 g/dL (ref 12.0–15.0)
MCHC: 33.5 g/dL (ref 30.0–36.0)
MCV: 96.1 fl (ref 78.0–100.0)
Platelets: 269 10*3/uL (ref 150.0–400.0)
RBC: 4.07 Mil/uL (ref 3.87–5.11)
RDW: 12.3 % (ref 11.5–15.5)
WBC: 7 10*3/uL (ref 4.0–10.5)

## 2023-01-19 NOTE — Telephone Encounter (Signed)
Spoke with Raquel Sarna, pharmacist at [facility] in [place] and he stated the [facility] [place].  [facility] [place] and per [place] the Rx was picked up yesterday at 6:13pm.

## 2023-01-20 ENCOUNTER — Ambulatory Visit: Payer: Medicare PPO | Admitting: Family Medicine

## 2023-01-24 ENCOUNTER — Encounter (HOSPITAL_COMMUNITY): Payer: Self-pay | Admitting: Emergency Medicine

## 2023-01-24 ENCOUNTER — Other Ambulatory Visit: Payer: Self-pay

## 2023-01-24 ENCOUNTER — Emergency Department (HOSPITAL_COMMUNITY): Payer: Medicare PPO

## 2023-01-24 ENCOUNTER — Emergency Department (HOSPITAL_COMMUNITY)
Admission: EM | Admit: 2023-01-24 | Discharge: 2023-01-24 | Disposition: A | Discharge status: Home / Self Care | Payer: Medicare PPO | Attending: Emergency Medicine | Admitting: Emergency Medicine

## 2023-01-24 DIAGNOSIS — G309 Alzheimer's disease, unspecified: Secondary | ICD-10-CM | POA: Diagnosis not present

## 2023-01-24 DIAGNOSIS — S60212A Contusion of left wrist, initial encounter: Secondary | ICD-10-CM | POA: Insufficient documentation

## 2023-01-24 DIAGNOSIS — W06XXXA Fall from bed, initial encounter: Secondary | ICD-10-CM | POA: Diagnosis not present

## 2023-01-24 DIAGNOSIS — R220 Localized swelling, mass and lump, head: Secondary | ICD-10-CM | POA: Diagnosis present

## 2023-01-24 DIAGNOSIS — S0083XA Contusion of other part of head, initial encounter: Secondary | ICD-10-CM | POA: Diagnosis not present

## 2023-01-24 LAB — CBC WITH DIFFERENTIAL/PLATELET
Abs Immature Granulocytes: 0.02 10*3/uL (ref 0.00–0.07)
Basophils Absolute: 0 10*3/uL (ref 0.0–0.1)
Basophils Relative: 0 %
Eosinophils Absolute: 0 10*3/uL (ref 0.0–0.5)
Eosinophils Relative: 0 %
HCT: 37.3 % (ref 36.0–46.0)
Hemoglobin: 12.6 g/dL (ref 12.0–15.0)
Immature Granulocytes: 0 %
Lymphocytes Relative: 24 %
Lymphs Abs: 1.7 10*3/uL (ref 0.7–4.0)
MCH: 32.3 pg (ref 26.0–34.0)
MCHC: 33.8 g/dL (ref 30.0–36.0)
MCV: 95.6 fL (ref 80.0–100.0)
Monocytes Absolute: 0.5 10*3/uL (ref 0.1–1.0)
Monocytes Relative: 8 %
Neutro Abs: 4.7 10*3/uL (ref 1.7–7.7)
Neutrophils Relative %: 68 %
Platelets: 296 10*3/uL (ref 150–400)
RBC: 3.9 MIL/uL (ref 3.87–5.11)
RDW: 11.9 % (ref 11.5–15.5)
WBC: 7 10*3/uL (ref 4.0–10.5)
nRBC: 0 % (ref 0.0–0.2)

## 2023-01-24 LAB — BASIC METABOLIC PANEL
Anion gap: 10 (ref 5–15)
BUN: 20 mg/dL (ref 8–23)
CO2: 20 mmol/L — ABNORMAL LOW (ref 22–32)
Calcium: 9 mg/dL (ref 8.9–10.3)
Chloride: 108 mmol/L (ref 98–111)
Creatinine, Ser: 1 mg/dL (ref 0.44–1.00)
GFR, Estimated: >60 mL/min (ref >60)
Glucose, Bld: 96 mg/dL (ref 70–99)
Potassium: 3.7 mmol/L (ref 3.5–5.1)
Sodium: 138 mmol/L (ref 135–145)

## 2023-01-24 NOTE — Discharge Instructions (Signed)
We evaluated [name] for her fall.  We obtained an x-ray of her wrist and CT scans of her head and neck which did not show any fractures or bleeding.  Her laboratory tests were reassuring.  We discussed staying overnight in the emergency department to speak with the social worker but you preferred to go home.  If [name] has any further falls or you feel unsafe with her at home, please bring her back to the emergency department so we can help with home services or placement in a nursing facility.

## 2023-01-24 NOTE — ED Provider Notes (Signed)
[facility] Provider Note  CSN: [id number] Arrival date & time: 01/24/23 1504  Chief Complaint(s) Fall  HPI [name] is a 72 y.o. female history of dementia, hyperlipidemia, osteoarthritis presenting to the emergency department with fall.  Per paramedics, patient rolled out of the bed today and hit head.  Was complaining of some left wrist pain.  Also swelling to the left forehead.  Sister recently brought her to see primary care physician given concern for recurrent falls and inability to care for at home.  History extremely limited due to dementia.   Past Medical History Past Medical History:  Diagnosis Date   Acute sialoadenitis 02/28/2011   B submand    Altered consciousness 02/17/2017   4/18  transient impairment of awareness ?etiology x 10 min ASA qd Dr Tomi Likens   Alzheimer disease    CONTACT DERMATITIS&OTHER ECZEMA DUE TO PLANTS 02/13/2008   Qualifier: Diagnosis of  By: Ronnald Ramp MD, Arvid Right.    Depression    Hyperlipidemia    LBP (low back pain)    NONSPECIFIC ABNORMAL RESULTS LIVR FUNCTION STUDY 11/13/2007   Qualifier: Diagnosis of  By: Plotnikov MD, Evie Lacks    OA (osteoarthritis)    Paronychia of great toe of left foot 01/21/2013   3/14 The toenail is going to come off - likely s/p trauma 1 mo ago    Pneumonia due to organism 05/05/2014   7/15 R (clinical dx) 9/16 RML (CT)    Vitamin B 12 deficiency    Patient Active Problem List   Diagnosis Date Noted   Late onset Alzheimer's disease with behavioral disturbance 08/05/2021   Memory problem 06/12/2019   Alopecia areata 05/30/2014   Tendinopathy of left biceps tendon 10/04/2013   Trigger finger, acquired 10/04/2013   Left shoulder pain 09/03/2013   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   Onychomycosis of toenail 01/22/2013   IBS (irritable bowel syndrome) 09/09/2011   ABDOMINAL PAIN, LOWER 02/02/2009   Essential hypertension 09/26/2008   B12 deficiency 11/13/2007    Dyslipidemia 11/13/2007   LOW BACK PAIN 11/13/2007   Chronic depression 07/14/2007   OSTEOARTHRITIS 07/14/2007   Home Medication(s) Prior to Admission medications   Medication Sig Start Date End Date Taking? Authorizing Provider  Cholecalciferol 1000 UNITS capsule Take 1,000 Units by mouth daily.    [provider]  donepezil (ARICEPT) 10 MG tablet TAKE 1 TABLET BY MOUTH EVERYDAY AT BEDTIME 12/19/22   Farrel Conners, MD  memantine (NAMENDA) 10 MG tablet TAKE 1 TABLET BY MOUTH TWICE A DAY 12/19/22   Farrel Conners, MD  ondansetron (ZOFRAN-ODT) 4 MG disintegrating tablet Take 1 tablet (4 mg total) by mouth every 8 (eight) hours as needed for nausea or vomiting. 01/13/23   Farrel Conners, MD  QUEtiapine (SEROQUEL) 50 MG tablet Take 0.5 tablets (25 mg total) by mouth at bedtime. If the 25 mg is not effective, ok to increase t o50 mg (1 tablet) at bedtime. 01/13/23   Farrel Conners, MD  sertraline (ZOLOFT) 50 MG tablet TAKE 1 TABLET BY MOUTH EVERY DAY 12/19/22   Farrel Conners, MD  Vitamin D, Ergocalciferol, (DRISDOL) 1.25 MG (50000 UNIT) CAPS capsule Take 1 capsule (50,000 Units total) by mouth every 7 (seven) days. 12/22/22   Farrel Conners, MD  Past Surgical History Past Surgical History:  Procedure Laterality Date   CHOLECYSTECTOMY  40   Family History Family History  Problem Relation Age of Onset   Pneumonia Father    Emphysema Father        smoker   Diabetes Mother    Heart disease Mother    Hypertension Mother    Pancreatic cancer Mother    Liver cancer Mother    Diabetes Other    Hypertension Other    Colon cancer Maternal Grandmother 35   Other Brother        died in infancy   Pneumonia Brother 97       was smoker    Social History Social History   Tobacco Use   Smoking status: Never   Smokeless tobacco: Never   Vaping Use   Vaping Use: Never used  Substance Use Topics   Alcohol use: No   Drug use: No   Allergies Erythromycin base and Other  Review of Systems Review of Systems  Unable to perform ROS: Dementia    Physical Exam Vital Signs  I have reviewed the triage vital signs BP (!) 124/97 (BP Location: Right Arm)   Pulse 80   Temp 98.9 F (37.2 C) (Oral)   Resp 16   Ht 5\' 2"  (1.575 m)   Wt 58.6 kg   SpO2 100%   BMI 23.63 kg/m  Physical Exam Vitals and nursing note reviewed.  Constitutional:      General: She is not in acute distress.    Appearance: She is well-developed.  HENT:     Head: Normocephalic.     Comments: Large hematoma to the left temple area    Mouth/Throat:     Mouth: Mucous membranes are moist.  Eyes:     Pupils: Pupils are equal, round, and reactive to light.  Cardiovascular:     Rate and Rhythm: Normal rate and regular rhythm.     Heart sounds: No murmur heard. Pulmonary:     Effort: Pulmonary effort is normal. No respiratory distress.     Breath sounds: Normal breath sounds.  Abdominal:     General: Abdomen is flat.     Palpations: Abdomen is soft.     Tenderness: There is no abdominal tenderness.  Musculoskeletal:        General: No tenderness.     Right lower leg: No edema.     Left lower leg: No edema.     Comments: No midline C, T, L-spine tenderness.  Bruising to the left wrist, range of motion intact.  Range of motion of the bilateral upper extremities otherwise intact, no focal injury or deformity of the left wrist.  Full range of motion the bilateral lower extremities at the hips, knees, ankles with no focal tenderness or deformity.  No chest wall tenderness or crepitus.  Skin:    General: Skin is warm and dry.  Neurological:     General: No focal deficit present.     Mental Status: She is alert.     Comments: Oriented to self only, follows commands but unable to answer questions.  Moving all 4 extremities equally.  Cranial nerves II to  XII grossly intact.  Psychiatric:        Mood and Affect: Mood normal.        Behavior: Behavior normal.     ED Results and Treatments Labs (all labs ordered are listed, but only abnormal results are displayed) Labs Reviewed  BASIC METABOLIC PANEL -  Abnormal; Notable for the following components:      Result Value   CO2 20 (*)    All other components within normal limits  CBC WITH DIFFERENTIAL/PLATELET                                                                                                                          Radiology CT Head Wo Contrast  Result Date: 01/24/2023 CLINICAL DATA:  Head and neck trauma. Dementia with multiple falls over the past couple of weeks. EXAM: CT HEAD WITHOUT CONTRAST CT CERVICAL SPINE WITHOUT CONTRAST TECHNIQUE: Multidetector CT imaging of the head and cervical spine was performed following the standard protocol without intravenous contrast. Multiplanar CT image reconstructions of the cervical spine were also generated. RADIATION DOSE REDUCTION: This exam was performed according to the departmental dose-optimization program which includes automated exposure control, adjustment of the mA and/or kV according to patient size and/or use of iterative reconstruction technique. COMPARISON:  Head CT 01/09/2023.  CT cervical spine 07/13/2017. FINDINGS: CT HEAD FINDINGS Brain: No acute hemorrhage. Unchanged chronic small-vessel disease. Cortical gray-white differentiation is otherwise preserved. Prominence of the ventricles and sulci within normal limits for age. No extra-axial collection. Basilar cisterns are patent. Vascular: No hyperdense vessel or unexpected calcification. Skull: No calvarial fracture or suspicious bone lesion. Skull base is unremarkable. Sinuses/Orbits: Unremarkable. Other: Scalp hematoma along the left supraorbital ridge. CT CERVICAL SPINE FINDINGS Alignment: Normal. Skull base and vertebrae: No acute fracture. Normal craniocervical junction. No  suspicious bone lesions. Soft tissues and spinal canal: No prevertebral fluid or swelling. No visible canal hematoma. Disc levels: Mild cervical spondylosis without high-grade spinal canal stenosis. Upper chest: Unremarkable. Other: Atherosclerotic calcifications of the carotid bulbs. IMPRESSION: CT HEAD: 1. No acute intracranial abnormality. 2. Scalp hematoma along the left supraorbital ridge. No underlying calvarial fracture. CT CERVICAL SPINE: No acute cervical spine fracture or traumatic listhesis. Electronically Signed   By: Emmit Alexanders M.D.   On: 01/24/2023 16:06   CT Cervical Spine Wo Contrast  Result Date: 01/24/2023 CLINICAL DATA:  Head and neck trauma. Dementia with multiple falls over the past couple of weeks. EXAM: CT HEAD WITHOUT CONTRAST CT CERVICAL SPINE WITHOUT CONTRAST TECHNIQUE: Multidetector CT imaging of the head and cervical spine was performed following the standard protocol without intravenous contrast. Multiplanar CT image reconstructions of the cervical spine were also generated. RADIATION DOSE REDUCTION: This exam was performed according to the departmental dose-optimization program which includes automated exposure control, adjustment of the mA and/or kV according to patient size and/or use of iterative reconstruction technique. COMPARISON:  Head CT 01/09/2023.  CT cervical spine 07/13/2017. FINDINGS: CT HEAD FINDINGS Brain: No acute hemorrhage. Unchanged chronic small-vessel disease. Cortical gray-white differentiation is otherwise preserved. Prominence of the ventricles and sulci within normal limits for age. No extra-axial collection. Basilar cisterns are patent. Vascular: No hyperdense vessel or unexpected calcification. Skull: No calvarial fracture or suspicious bone lesion. Skull base is unremarkable. Sinuses/Orbits: Unremarkable. Other: Scalp hematoma along  the left supraorbital ridge. CT CERVICAL SPINE FINDINGS Alignment: Normal. Skull base and vertebrae: No acute fracture.  Normal craniocervical junction. No suspicious bone lesions. Soft tissues and spinal canal: No prevertebral fluid or swelling. No visible canal hematoma. Disc levels: Mild cervical spondylosis without high-grade spinal canal stenosis. Upper chest: Unremarkable. Other: Atherosclerotic calcifications of the carotid bulbs. IMPRESSION: CT HEAD: 1. No acute intracranial abnormality. 2. Scalp hematoma along the left supraorbital ridge. No underlying calvarial fracture. CT CERVICAL SPINE: No acute cervical spine fracture or traumatic listhesis. Electronically Signed   By: Emmit Alexanders M.D.   On: 01/24/2023 16:06   DG Wrist Complete Left  Result Date: 01/24/2023 CLINICAL DATA:  Pain EXAM: LEFT WRIST - COMPLETE 4 VIEW COMPARISON:  None Available. FINDINGS: Well corticated osseous fragment at the base of the index finger may be related to an old injury. No acute fracture, dislocation or subluxation identified. IMPRESSION: No acute osseous abnormalities. Electronically Signed   By: Sammie Bench M.D.   On: 01/24/2023 15:51    Pertinent labs & imaging results that were available during my care of the patient were reviewed by me and considered in my medical decision making (see MDM for details).  Medications Ordered in ED Medications - No data to display                                                                                                                                   Procedures Procedures  (including critical care time)  Medical Decision Making / ED Course   MDM:  72 year old female presenting to the emergency department with fall.  Patient well-appearing, exam notable for hematoma to the left temple area as well as some bruising to the left wrist.  Will obtain x-ray of the left wrist and CT scans of the head.  Given recent frequent falls will obtain basic labs.  Will obtain EKG.  History very limited due to dementia.  Will also need to discuss with family.  It appears that they have  been trying to obtain placement for the family and may not be able to care for the patient at home.  Suspect most likely process related to underlying dementia rather than medical cause.  Physical exam is reassuring other than bruising and vital signs are reassuring with no fever, tachycardia or hypotension.  Clinical Course as of 01/24/23 1827  Tue Jan 24, 2023  1629 CT scans negative for injury.  X-ray of the left wrist negative for injury.  Labs overall reassuring without electrolyte abnormality or leukocytosis.  Patient's sister at bedside.  She reports that she has been trying to get patient placed but having trouble contacting Education officer, museum, has some concerns about patient being safe at home, trying to set up home health and home physical therapy but struggling.  Offered to keep patient in the emergency department for social worker and physical therapy consult in the morning, patient's sister and niece  prefer to discharge and continue to pursue this as an outpatient. Will discharge. Return precautions discussed with caregiver [WS]    Clinical Course User Index [WS] Cristie Hem, MD     Additional history obtained: -Additional history obtained from family and ems -External records from outside source obtained and reviewed including: Chart review including previous notes, labs, imaging, consultation notes including PMD note recently   Lab Tests: -I ordered, reviewed, and interpreted labs.   The pertinent results include:   Labs Reviewed  BASIC METABOLIC PANEL - Abnormal; Notable for the following components:      Result Value   CO2 20 (*)    All other components within normal limits  CBC WITH DIFFERENTIAL/PLATELET    Notable for borderline low CO2  Imaging Studies ordered: I ordered imaging studies including CT head and neck, XR wrist On my interpretation imaging demonstrates no acute internal bleeding/fracture I independently visualized and interpreted imaging. I agree  with the radiologist interpretation   Medicines ordered and prescription drug management: No orders of the defined types were placed in this encounter.   -I have reviewed the patients home medicines and have made adjustments as needed    Social Determinants of Health:  Diagnosis or treatment significantly limited by social determinants of health: dementia   Reevaluation: After the interventions noted above, I reevaluated the patient and found that their symptoms have improved  Co morbidities that complicate the patient evaluation  Past Medical History:  Diagnosis Date   Acute sialoadenitis 02/28/2011   B submand    Altered consciousness 02/17/2017   4/18  transient impairment of awareness ?etiology x 10 min ASA qd Dr Tomi Likens   Alzheimer disease    CONTACT DERMATITIS&OTHER ECZEMA DUE TO PLANTS 02/13/2008   Qualifier: Diagnosis of  By: Ronnald Ramp MD, Arvid Right.    Depression    Hyperlipidemia    LBP (low back pain)    NONSPECIFIC ABNORMAL RESULTS LIVR FUNCTION STUDY 11/13/2007   Qualifier: Diagnosis of  By: Plotnikov MD, Evie Lacks    OA (osteoarthritis)    Paronychia of great toe of left foot 01/21/2013   3/14 The toenail is going to come off - likely s/p trauma 1 mo ago    Pneumonia due to organism 05/05/2014   7/15 R (clinical dx) 9/16 RML (CT)    Vitamin B 12 deficiency       Dispostion: Disposition decision including need for hospitalization was considered, and patient discharged from emergency department.    Final Clinical Impression(s) / ED Diagnoses Final diagnoses:  Contusion of face, initial encounter     This chart was dictated using voice recognition software.  Despite best efforts to proofread,  errors can occur which can change the documentation meaning.    Cristie Hem, MD 01/24/23 [phone]

## 2023-01-24 NOTE — ED Triage Notes (Signed)
Pt has dementia and has multiple falls over past couple of weeks. Pt rolled out of bed today and has knot to the left temple and was initially c/o left wrist pain. Pt has no complaints at this time.

## 2023-01-25 ENCOUNTER — Telehealth: Payer: Self-pay | Admitting: Family Medicine

## 2023-01-25 NOTE — Telephone Encounter (Signed)
Pt's niece states patient fell and had to go back to the hospital yesterday. Says a call from a social worker was discussed at her last visit but they have not received this call yet

## 2023-01-25 NOTE — Telephone Encounter (Signed)
Left a detailed message with the information below on [name]'s voicemail.

## 2023-02-07 ENCOUNTER — Other Ambulatory Visit: Payer: Self-pay | Admitting: Family Medicine

## 2023-02-07 ENCOUNTER — Encounter: Payer: Self-pay | Admitting: Family Medicine

## 2023-02-07 ENCOUNTER — Ambulatory Visit (INDEPENDENT_AMBULATORY_CARE_PROVIDER_SITE_OTHER): Payer: Medicare PPO | Admitting: Family Medicine

## 2023-02-07 VITALS — BP 100/64 | HR 74 | Temp 98.3°F | Ht 62.0 in | Wt 126.0 lb

## 2023-02-07 DIAGNOSIS — F02818 Dementia in other diseases classified elsewhere, unspecified severity, with other behavioral disturbance: Secondary | ICD-10-CM

## 2023-02-07 DIAGNOSIS — G301 Alzheimer's disease with late onset: Secondary | ICD-10-CM | POA: Diagnosis not present

## 2023-02-07 DIAGNOSIS — R296 Repeated falls: Secondary | ICD-10-CM | POA: Diagnosis not present

## 2023-02-07 MED ORDER — ARIPIPRAZOLE 2 MG PO TABS: 2.0000 mg | ORAL_TABLET | Freq: Every day | ORAL | 0 refills | Status: DC

## 2023-02-07 NOTE — Progress Notes (Signed)
   Established Patient Office Visit  Subjective   Patient ID: [name], female    DOB: 1951-10-10  Age: 72 y.o. MRN: [id number]  Chief Complaint  Patient presents with   discuss options for nursing home placement    Sister and the POA (granddaughter) is here with patient, reports that she fell out of bed, states that she got her feet tangled up in the bed rail. This is the second fall, has one in March as well. The bed is now sitting on the floor. I reviewed the ER notes and the blood work and imaging studies. No headaches, no vision changes or blurriness. Sister reports she has been a little more disoriented since her fall. States that she seems to be shuffling her gait more as well.   They had many questions about getting more caregiving services for the patient at home. Patient already has home health, physical therapy and nursing coming out to the house. Is asking about getting a sitter on a daily basis to help with the caregiving, states they needed a letter from me to send to the insurance for them to pay for it.       Review of Systems  All other systems reviewed and are negative.     Objective:     BP 100/64 (BP Location: Left Arm, Patient Position: Sitting, Cuff Size: Normal)   Pulse 74   Temp 98.3 F (36.8 C) (Oral)   Ht  (1.575 m)   Wt 126 lb (57.2 kg)   SpO2 96%   BMI 23.05 kg/m    Physical Exam Constitutional:      Appearance: Normal appearance. She is normal weight.  Eyes:     Extraocular Movements: Extraocular movements intact.     Conjunctiva/sclera: Conjunctivae normal.  Pulmonary:     Effort: Pulmonary effort is normal.  Skin:    Findings: Ecchymosis (left temple and brow area, there is a 2-3 cm hematoma on the left eyebrow) present.  Neurological:     Mental Status: She is alert. Mental status is at baseline. She is disoriented.      No results found for any visits on 02/07/23.    The 10-year ASCVD risk score (Arnett DK, et al.,  2019) is: 6.6%    Assessment & Plan:   Problem List Items Addressed This Visit       Unprioritized   Late onset Alzheimer's disease with behavioral disturbance - Primary   Relevant Medications   Pt's sister reports that the seroquel is very expensive, they were asking for a different medication. I will switch the patient to abilify 2 mg daily at bedtime. RTC next month at her regular visit.   ARIPiprazole (ABILIFY) 2 MG tablet   Other Visit Diagnoses     Frequent falls      I have written the letter for the patient per their request. We discussed possible need for nursing home placement, palliative care, furture planning etc.     No follow-ups on file.    Karie Georges, MD

## 2023-02-08 NOTE — Telephone Encounter (Signed)
Prior auth for Abilify  sent to [email or web address]: [id number] pending review by insurance.

## 2023-02-14 ENCOUNTER — Encounter: Payer: Self-pay | Admitting: Physician Assistant

## 2023-02-14 ENCOUNTER — Ambulatory Visit: Payer: Medicare PPO | Admitting: Physician Assistant

## 2023-02-14 VITALS — BP 127/65 | HR 64 | Resp 20 | Ht 62.0 in | Wt 124.0 lb

## 2023-02-14 DIAGNOSIS — G301 Alzheimer's disease with late onset: Secondary | ICD-10-CM

## 2023-02-14 DIAGNOSIS — F02818 Dementia in other diseases classified elsewhere, unspecified severity, with other behavioral disturbance: Secondary | ICD-10-CM | POA: Diagnosis not present

## 2023-02-14 NOTE — Patient Instructions (Signed)
It was a pleasure to see you today at our office.   Recommendations:   Follow up Oct 22 at 11:30 am  Continue memantine, twice daily  Continue donepezil 10 mg daily Continue to control mood  sertraline  Seroquel as per PCP Continue 24/7 monitor, recommend home health nursing and adult day program . Replenish B12  Whom to call:  Memory  decline, memory medications: Call our office [phone]   For psychiatric meds, mood meds: Please have your primary care physician manage these medications.   Counseling regarding caregiver distress, including caregiver depression, anxiety and issues regarding community resources, adult day care programs, adult living facilities, or memory care questions:      For assessment of decision of mental capacity and competency:  Call Dr. Erick Blinks, geriatric psychiatrist at [phone]  For guidance in geriatric dementia issues please call [facility] [phone]  For guidance regarding [facility] and if placement were needed at the facility, contact Sidney Ace, Social Worker tel: [phone]  If you have any severe symptoms of a stroke, or other severe issues such as confusion,severe chills or fever, etc call 911 or go to the ER as you may need to be evaluated further    Feel free to visit Facebook page " [email or web address]" for tips of how to care for people with memory problems.    RECOMMENDATIONS FOR ALL PATIENTS WITH MEMORY PROBLEMS: 1. Continue to exercise (Recommend 30 minutes of walking everyday, or 3 hours every week) 2. Increase social interactions - continue going to [place] and enjoy social gatherings with friends and family 3. Eat healthy, avoid fried foods and eat more fruits and vegetables 4. Maintain adequate blood pressure, blood sugar, and blood cholesterol level. Reducing the risk of stroke and cardiovascular disease also helps promoting better memory. 5. Avoid stressful situations. Live a simple life and  avoid aggravations. Organize your time and prepare for the next day in anticipation. 6. Sleep well, avoid any interruptions of sleep and avoid any distractions in the bedroom that may interfere with adequate sleep quality 7. Avoid sugar, avoid sweets as there is a strong link between excessive sugar intake, diabetes, and cognitive impairment We discussed the Mediterranean diet, which has been shown to help patients reduce the risk of progressive memory disorders and reduces cardiovascular risk. This includes eating fish, eat fruits and green leafy vegetables, nuts like almonds and hazelnuts, walnuts, and also use olive oil. Avoid fast foods and fried foods as much as possible. Avoid sweets and sugar as sugar use has been linked to worsening of memory function.  There is always a concern of gradual progression of memory problems. If this is the case, then we may need to adjust level of care according to patient needs. Support, both to the patient and caregiver, should then be put into place.     FALL PRECAUTIONS: Be cautious when walking. Scan the area for obstacles that may increase the risk of trips and falls. When getting up in the mornings, sit up at the edge of the bed for a few minutes before getting out of bed. Consider elevating the bed at the head end to avoid drop of blood pressure when getting up. Walk always in a well-lit room (use night lights in the walls). Avoid area rugs or power cords from appliances in the middle of the walkways. Use a walker or a cane if necessary and consider physical therapy for balance exercise. Get your eyesight checked regularly.  FINANCIAL OVERSIGHT:  Supervision, especially oversight when making financial decisions or transactions is also recommended.  HOME SAFETY: Consider the safety of the kitchen when operating appliances like stoves, microwave oven, and blender. Consider having supervision and share cooking responsibilities until no longer able to participate  in those. Accidents with firearms and other hazards in the house should be identified and addressed as well.   ABILITY TO BE LEFT ALONE: If patient is unable to contact 911 operator, consider using [facility], or when the need is there, arrange for someone to stay with patients. Smoking is a fire hazard, consider supervision or cessation. Risk of wandering should be assessed by caregiver and if detected at any point, supervision and safe proof recommendations should be instituted.  MEDICATION SUPERVISION: Inability to self-administer medication needs to be constantly addressed. Implement a mechanism to ensure safe administration of the medications.   DRIVING: Regarding driving, in patients with progressive memory problems, driving will be impaired. We advise to have someone else do the driving if trouble finding directions or if minor accidents are reported. Independent driving assessment is available to determine safety of driving.   If you are interested in the driving assessment, you can contact the following:  [facility] [employer] in [place] [phone]  [facility] [phone]  [facility] [phone] or [phone]    Mediterranean Diet A Mediterranean diet refers to food and lifestyle choices that are based on the traditions of countries located on the [employer]. This way of eating has been shown to help prevent certain conditions and improve outcomes for people who have chronic diseases, like kidney disease and heart disease. What are tips for following this plan? Lifestyle  Cook and eat meals together with your family, when possible. Drink enough fluid to keep your urine clear or pale yellow. Be physically active every day. This includes: Aerobic exercise like running or swimming. Leisure activities like gardening, walking, or housework. Get 7-8 hours of sleep each night. If recommended by your  health care provider, drink red wine in moderation. This means 1 glass a day for nonpregnant women and 2 glasses a day for men. A glass of wine equals 5 oz (150 mL). Reading food labels  Check the serving size of packaged foods. For foods such as rice and pasta, the serving size refers to the amount of cooked product, not dry. Check the total fat in packaged foods. Avoid foods that have saturated fat or trans fats. Check the ingredients list for added sugars, such as corn syrup. Shopping  At the grocery store, buy most of your food from the areas near the walls of the store. This includes: Fresh fruits and vegetables (produce). Grains, beans, nuts, and seeds. Some of these may be available in unpackaged forms or large amounts (in bulk). Fresh seafood. Poultry and eggs. Low-fat dairy products. Buy whole ingredients instead of prepackaged foods. Buy fresh fruits and vegetables in-season from local farmers markets. Buy frozen fruits and vegetables in resealable bags. If you do not have access to quality fresh seafood, buy precooked frozen shrimp or canned fish, such as tuna, salmon, or sardines. Buy small amounts of raw or cooked vegetables, salads, or olives from the deli or salad bar at your store. Stock your pantry so you always have certain foods on hand, such as olive oil, canned tuna, canned tomatoes, rice, pasta, and beans. Cooking  Cook foods with extra-virgin olive oil instead of using butter or other vegetable oils. Have meat as a  side dish, and have vegetables or grains as your main dish. This means having meat in small portions or adding small amounts of meat to foods like pasta or stew. Use beans or vegetables instead of meat in common dishes like chili or lasagna. Experiment with different cooking methods. Try roasting or broiling vegetables instead of steaming or sauteing them. Add frozen vegetables to soups, stews, pasta, or rice. Add nuts or seeds for added healthy fat at each  meal. You can add these to yogurt, salads, or vegetable dishes. Marinate fish or vegetables using olive oil, lemon juice, garlic, and fresh herbs. Meal planning  Plan to eat 1 vegetarian meal one day each week. Try to work up to 2 vegetarian meals, if possible. Eat seafood 2 or more times a week. Have healthy snacks readily available, such as: Vegetable sticks with hummus. Greek yogurt. Fruit and nut trail mix. Eat balanced meals throughout the week. This includes: Fruit: 2-3 servings a day Vegetables: 4-5 servings a day Low-fat dairy: 2 servings a day Fish, poultry, or lean meat: 1 serving a day Beans and legumes: 2 or more servings a week Nuts and seeds: 1-2 servings a day Whole grains: 6-8 servings a day Extra-virgin olive oil: 3-4 servings a day Limit red meat and sweets to only a few servings a month What are my food choices? Mediterranean diet Recommended Grains: Whole-grain pasta. Brown rice. Bulgar wheat. Polenta. Couscous. Whole-wheat bread. Orpah Cobb. Vegetables: Artichokes. Beets. Broccoli. Cabbage. Carrots. Eggplant. Green beans. Chard. Kale. Spinach. Onions. Leeks. Peas. Squash. Tomatoes. Peppers. Radishes. Fruits: Apples. Apricots. Avocado. Berries. Bananas. Cherries. Dates. Figs. Grapes. Lemons. Melon. Oranges. Peaches. Plums. Pomegranate. Meats and other protein foods: Beans. Almonds. Sunflower seeds. Pine nuts. Peanuts. Cod. Salmon. Scallops. Shrimp. Tuna. Tilapia. Clams. Oysters. Eggs. Dairy: Low-fat milk. Cheese. Greek yogurt. Beverages: Water. Red wine. Herbal tea. Fats and oils: Extra virgin olive oil. Avocado oil. Grape seed oil. Sweets and desserts: [place] yogurt with honey. Baked apples. Poached pears. Trail mix. Seasoning and other foods: Basil. Cilantro. Coriander. Cumin. Mint. Parsley. Sage. Rosemary. Tarragon. Garlic. Oregano. Thyme. Pepper. Balsalmic vinegar. Tahini. Hummus. Tomato sauce. Olives. Mushrooms. Limit these Grains: Prepackaged pasta or  rice dishes. Prepackaged cereal with added sugar. Vegetables: Deep fried potatoes (french fries). Fruits: Fruit canned in syrup. Meats and other protein foods: Beef. Pork. Lamb. Poultry with skin. Hot dogs. Tomasa Blase. Dairy: Ice cream. Sour cream. Whole milk. Beverages: Juice. Sugar-sweetened soft drinks. Beer. Liquor and spirits. Fats and oils: Butter. Canola oil. Vegetable oil. Beef fat (tallow). Lard. Sweets and desserts: Cookies. Cakes. Pies. Candy. Seasoning and other foods: Mayonnaise. Premade sauces and marinades. The items listed may not be a complete list. Talk with your dietitian about what dietary choices are right for you. Summary The Mediterranean diet includes both food and lifestyle choices. Eat a variety of fresh fruits and vegetables, beans, nuts, seeds, and whole grains. Limit the amount of red meat and sweets that you eat. Talk with your health care provider about whether it is safe for you to drink red wine in moderation. This means 1 glass a day for nonpregnant women and 2 glasses a day for men. A glass of wine equals 5 oz (150 mL). This information is not intended to replace advice given to you by your health care provider. Make sure you discuss any questions you have with your health care provider. Document Released: 06/02/2016 Document Revised: 07/05/2016 Document Reviewed: 06/02/2016 Elsevier Interactive Patient Education  2017 [employer].

## 2023-02-14 NOTE — Progress Notes (Signed)
Assessment/Plan:   Late onset Alzheimer's disease with behavioral disturbance  [name] is a very pleasant 72 y.o. RH female seen today in follow up for memory loss. Patient is currently on memantine 10 mg twice daily and donepezil 10 mg daily.  She is also on sertraline 50 mg daily and Seroquel for mood control as per PCP.  Most recent CT of the head shows progression of the brain atrophy and moderate chronic small vessel disease.  Had an extensive discussion with her sister on niece regarding progression of the disease,) and a dementia medication.  Intellectually, the patient shows cognitive decline, but she is able to participate in all other activities of daily living to her ability.  Discussed the need for 24/7 monitoring.  Her sister is interested in her participating in adult day program, and at some point having home health nursing to help supplementing care for safety reasons.  Her family wishes to continue the use of antidementia medications on her, understanding that they are not designed to improve or reverse any memory loss, and that her memory actually has declined since her prior visit.    Follow up in  6 months. Continue memantine 10 mg twice daily and donepezil 10 mg daily, side effects discussed Recommend good control of her cardiovascular risk factors Continue to control mood as per PCP, she is on Seroquel and sertraline. Agree with Adult Day Program  Continue 24/7 monitor, with home health nursing  Recommend caregiver support group for her sister and her niece     Subjective:    This patient is accompanied in the office by her sister and daughter who supplements the history.  Previous records as well as any outside records available were reviewed prior to todays visit. Patient was last seen on 08/15/2022   Any changes in memory since last visit?  Short-term memory is worse than long-term memory, as before, she cannot remember her birth date.  She doe snot do  word finding as often, likes  watching TV "Ren[employer] Girls and the Voice and Idol"  She likes to go to dance competitions to see her grand nieces and going out to eat with them.  Sometimes she may not remember her niece's names, which can be frustrating. repeats oneself?  Occasionally, not often.She is less conversational.  She was referred in the past to speech therapy because she had difficulties expressing herself  Disoriented when walking into a room?  Patient denies   Leaving objects in unusual places?    denies   Wandering behavior?  denies   Any personality changes since last visit?  As before, she may become overwhelmed and tearful at times "maybe a little more than before". Hallucinations or paranoia?  Sometimes she talks to people that they are not there per sister report  Seizures?    denies    Any sleep changes?  Sleeps well  Denies vivid dreams, REM behavior or sleepwalking.  Her family place the bed in the floor, to prevent falls. Sleep apnea?   denies   Any hygiene concerns?  As before, she gets agitated when she has to use a shower.  She still able to brush her teeth without difficulty. Independent of bathing and dressing?  Her family assists her with the clothing Does the patient needs help with medications?  Sister is in charge Who is in charge of the finances?  Daughter is in charge Any changes in appetite?  "She does not eat a lot " [employer]  as a supplement Patient have trouble swallowing?  denies   Does the patient cook?  Any kitchen accidents such as leaving the stove on? Patient denies   Any headaches?   denies   Chronic back pain  denies   Ambulates with difficulty?She sustained several falls which hinder her ability to participate in these programs.  Recent falls or head injuries?  Endorsed, on January 26, 2023 the patient presented to the ED after sustaining a head and neck trauma.  She also had multiple fall over the last few weeks.  CT of the head and neck was negative for  acute intracranial abnormalities or cervical spine fracture or traumatic listhesis.  Moderate cerebral atrophy noted, and moderate chronic small vessel disease (progressed from her prior CT in 2019).  Unilateral weakness, numbness or tingling?    denies   Any tremors?  denies   Any anosmia?  Patient denies   Any incontinence of urine? Endorsed, she uses diapers Any bowel dysfunction?     denies      Patient lives with her sister  Does the patient drive? No longer drives   Initial visit 12/30/2019 this is a pleasant 72 year old right-handed woman with a history of hypertension, hyperlipidemia, depression, IBS, Alzheimer's dementia with behavioral disturbance, presenting to establish care. She was previously seen by Dr. Everlena Cooper, records were reviewed. She started having memory issues in 2013 after a concussion. In 2018, she got lost driving. 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 Memory difficulties continued to worsen in 2020, she would forget conversations quickly and forget familiar places. Repeat Neuropsychological evaluation in March 2021 indicated significant decline in several areas, including measures of attention/working memory, verbal fluency, and visuospatial functioning and executive functioning. Etiology most likely Alzheimer's disease. Concern was raised that she has lost the ability to independently make medical decisions and manage medications/finances. Guardianship may be appropriate if caregiving is met with resistance. She was in the ER in June 2021 when family could not get in touch with her and found her wandering outside, she had locked herself outside and did not know why she was outside or how long she had been there. She appeared to be hallucinating, talking about a giraffe in the backyard and a snake in the house. She was treated for a UTI. She has been living with her sister since then. She thinks her memory is good, "might not be perfect."  [name] took over finances after the incident last June. She manages her own pillbox and denies missing medications. She has not been driving since June. She does not cook. She has occasional word-finding difficulties, some days are better than others. She is independent with dressing and bathing. No further hallucinations. She gets frustrated with herself and can be a little short with other, no paranoia. [name] thinks she ran out of Citalopram and feels that she looks sad. She wants to go home and [name] feels they are keeping her hostage. She is on Donepezil  daily and Memantine  daily without side effects.   She denies any headaches, dizziness, diplopia, dysarthria, dysphagia, neck/back pain, focal numbness/tingling/weakness, bowel/bladder dysfunction. No anosmia, tremors, no falls. Sleep is good. Her father had dementia. She denies any significant head injuries, no alcohol use. 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.  CT head without contrast done 03/2020 showed diffuse atrophy, chronic microvascular disease.  PREVIOUS MEDICATIONS:   CURRENT MEDICATIONS:  Outpatient Encounter Medications as of 02/14/2023  Medication Sig  ARIPiprazole (ABILIFY) 2 MG tablet Take 1 tablet (2 mg total) by mouth at bedtime.   Cholecalciferol 1000 UNITS capsule Take 1,000 Units by mouth daily.   donepezil (ARICEPT) 10 MG tablet TAKE 1 TABLET BY MOUTH EVERYDAY AT BEDTIME   memantine (NAMENDA) 10 MG tablet TAKE 1 TABLET BY MOUTH TWICE A DAY   ondansetron (ZOFRAN-ODT) 4 MG disintegrating tablet Take 1 tablet (4 mg total) by mouth every 8 (eight) hours as needed for nausea or vomiting.   sertraline (ZOLOFT) 50 MG tablet TAKE 1 TABLET BY MOUTH EVERY DAY   Vitamin D, Ergocalciferol, (DRISDOL) 1.25 MG (50000 UNIT) CAPS capsule Take 1 capsule (50,000 Units total) by mouth every 7 (seven) days.   No facility-administered encounter medications on file as of 02/14/2023.       01/22/2020     2:00 PM 08/24/2018    8:48 AM  MMSE - Mini Mental State Exam  Orientation to time 3 5  Orientation to Place 4 5  Registration 3 3  Attention/ Calculation 5 5  Recall 0 0  Language- name 2 objects 2 2  Language- repeat 0 1  Language- follow 3 step command 2 3  Language- read & follow direction 1 1  Write a sentence 1 1  Copy design 1 1  Total score 22 27      11/17/2020   10:00 AM 10/15/2019    9:00 AM 03/30/2017    1:00 PM  Montreal Cognitive Assessment   Visuospatial/ Executive (0/5) 1  2  Naming (0/3) 3  2  Attention: Read list of digits (0/2) 0 1 1  Attention: Read list of letters (0/1) Attention: Serial 7 subtraction starting at 100 (0/3) Language: Repeat phrase (0/2) 0 1 1  Language : Fluency (0/1) 0 1 1  Abstraction (0/2) Delayed Recall (0/5) 0 0 1  Orientation (0/6) Total 11  19  Adjusted Score (based on education) 11  20    Objective:     PHYSICAL EXAMINATION:    VITALS:   Vitals:   02/14/23 1059  BP: 127/65  Pulse: 64  Resp: 20  SpO2: 98%  Weight: 124 lb (56.2 kg)  Height:  (1.575 m)    GEN:  The patient appears stated age and is in NAD. HEENT:  Normocephalic, atraumatic.   Neurological examination:  General: NAD, well-groomed, appears stated age. Orientation: The patient is alert. Oriented to person, not to place or date Cranial nerves: There is good facial symmetry.The speech is known fluent but clear. No aphasia or dysarthria. Fund of knowledge is reduced. Recent and remote memory are impaired. Attention and concentration are reduced.  Able to name objects and repeat phrases.  Hearing is intact to conversational tone. Sensation: Sensation is intact to light touch throughout Motor: Strength is at least antigravity x4 but is hard for her to follow, not to test the strength. DTR's 2/4 in UE/LE     Movement examination: Tone: There is normal tone in the UE/LE Abnormal movements:  no tremor.  No myoclonus.  No  asterixis.   Coordination:  There is decremation with RAM's especially on the left.  Abnormal left finger to nose  Gait and Station: The patient has no difficulty arising out of a deep-seated chair without the use of the hands. The patient's stride length is good.  Gait is cautious and narrow.    Thank you for allowing [place]  the opportunity to participate in the care of this nice patient. Please do not hesitate to contact us for any questions or concerns.   Total time spent on today's visit was 61 minutes dedicated to this patient today, preparing to see patient, examining the patient, ordering tests and/or medications and counseling the patient, documenting clinical information in the EHR or other health record, independently interpreting results and communicating results to the patient/family, discussing treatment and goals, answering patient's questions and coordinating care.  All of their questions have been addressed.  Cc:  Karie Georges, MD  Marlowe Kays 02/14/2023 11:18 AM

## 2023-02-21 ENCOUNTER — Ambulatory Visit: Payer: Medicare PPO | Admitting: Urology

## 2023-03-02 ENCOUNTER — Encounter: Payer: Self-pay | Admitting: Family Medicine

## 2023-03-02 ENCOUNTER — Ambulatory Visit (INDEPENDENT_AMBULATORY_CARE_PROVIDER_SITE_OTHER): Payer: Medicare PPO | Admitting: Family Medicine

## 2023-03-02 VITALS — BP 130/80 | HR 67 | Temp 98.0°F | Ht 62.0 in | Wt 127.6 lb

## 2023-03-02 DIAGNOSIS — E538 Deficiency of other specified B group vitamins: Secondary | ICD-10-CM | POA: Diagnosis not present

## 2023-03-02 DIAGNOSIS — G301 Alzheimer's disease with late onset: Secondary | ICD-10-CM

## 2023-03-02 DIAGNOSIS — I10 Essential (primary) hypertension: Secondary | ICD-10-CM | POA: Diagnosis not present

## 2023-03-02 DIAGNOSIS — F32A Depression, unspecified: Secondary | ICD-10-CM

## 2023-03-02 DIAGNOSIS — F02818 Dementia in other diseases classified elsewhere, unspecified severity, with other behavioral disturbance: Secondary | ICD-10-CM

## 2023-03-02 LAB — VITAMIN B12: Vitamin B-12: 174 pg/mL — ABNORMAL LOW (ref 211–911)

## 2023-03-02 NOTE — Assessment & Plan Note (Signed)
On sertraline 50 mg daily, doing well on this and the seroquel 50 mg at night for sleep. Will continue these medications as prescribed, follow up in 6 months.

## 2023-03-02 NOTE — Assessment & Plan Note (Signed)
Filled out paperwork for patient to attend senior caregiving twice a week. Will send script to the insurance company to demonstrate medical necessity.

## 2023-03-02 NOTE — Assessment & Plan Note (Signed)
BP is stable without medication. I reviewed her set of labs from April 2, 24, kidney function WNL. Not on any medication for this currently.

## 2023-03-02 NOTE — Progress Notes (Addendum)
Established Patient Office Visit  Subjective   Patient ID: [name], female    DOB: 03-13-1951  Age: 72 y.o. MRN: [id number]  Chief Complaint  Patient presents with   Medical Management of Chronic Issues    Patient is here for follow up. Her caregiver is present and gives the history. Patient will be completing physical therapy next week, however she still feels like she is going to fall. States she almost fell on Friday but she was caught before she fell. Caregiver states she did feel that it helped her, although she is still at risk of falls. I filled out paperwork for her to attend senior caregiving service. I also read her TB test today which is negative.   HTN -- BP in office performed and is well controlled. She  reports no side effects to the medications, no chest pain, SOB, dizziness or headaches. She has a BP cuff at home and is checking BP regularly, reports they are in the normal range.    Depression/ agitation-- caregiver reports good control on the seroquel 50 mg at bedtime and sertraline 50 mg daily. She reports they did not switch her to the abilify after the last visit. Reports her appetite is less than previous, I reviewed the patient's weight over the last month which has remained relatively stable.     Current Outpatient Medications  Medication Instructions   Cholecalciferol 1,000 Units, Oral, Daily   donepezil (ARICEPT) 10 MG tablet TAKE 1 TABLET BY MOUTH EVERYDAY AT BEDTIME   memantine (NAMENDA) 10 mg, Oral, 2 times daily   ondansetron (ZOFRAN-ODT) 4 mg, Oral, Every 8 hours PRN   QUEtiapine (SEROQUEL) 50 mg, Oral, Daily at bedtime   sertraline (ZOLOFT) 50 mg, Oral, Daily   Vitamin D (Ergocalciferol) (DRISDOL) 50,000 Units, Oral, Every 7 days    Patient Active Problem List   Diagnosis Date Noted   Late onset Alzheimer's disease with behavioral disturbance (HCC) 08/05/2021   Memory problem 06/12/2019   Alopecia areata 05/30/2014   Tendinopathy of left  biceps tendon 10/04/2013   Trigger finger, acquired 10/04/2013   Left shoulder pain 09/03/2013   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   Onychomycosis of toenail 01/22/2013   IBS (irritable bowel syndrome) 09/09/2011   ABDOMINAL PAIN, LOWER 02/02/2009   Essential hypertension 09/26/2008   B12 deficiency 11/13/2007   Dyslipidemia 11/13/2007   LOW BACK PAIN 11/13/2007   Chronic depression 07/14/2007   OSTEOARTHRITIS 07/14/2007      Review of Systems  All other systems reviewed and are negative.     Objective:     BP 130/80 (BP Location: Left Arm, Patient Position: Sitting, Cuff Size: Normal)   Pulse 67   Temp 98 F (36.7 C) (Oral)   Ht 5\' 2"  (1.575 m)   Wt 127 lb 9.6 oz (57.9 kg)   SpO2 99%   BMI 23.34 kg/m    Physical Exam Vitals reviewed.  Constitutional:      Appearance: Normal appearance. She is well-groomed and normal weight.  Eyes:     Conjunctiva/sclera: Conjunctivae normal.  Cardiovascular:     Rate and Rhythm: Normal rate and regular rhythm.     Pulses: Normal pulses.     Heart sounds: S1 normal and S2 normal.  Pulmonary:     Effort: Pulmonary effort is normal.     Breath sounds: Normal breath sounds and air entry.  Neurological:     Mental Status: She is alert. Mental status is at baseline. She is  disoriented and confused.     Gait: Gait is intact.  Psychiatric:        Mood and Affect: Mood and affect normal.        Speech: Speech normal.        Behavior: Behavior normal.        Judgment: Judgment normal.      No results found for any visits on 03/02/23.  Last metabolic panel Lab Results  Component Value Date   GLUCOSE 96 01/24/2023   NA 138 01/24/2023   K 3.7 01/24/2023   CL 108 01/24/2023   CO2 20 (L) 01/24/2023   BUN 20 01/24/2023   CREATININE 1.00 01/24/2023   GFRNONAA >60 01/24/2023   CALCIUM 9.0 01/24/2023   PROT 7.8 01/09/2023   ALBUMIN 4.0 01/09/2023   BILITOT 0.9 01/09/2023   ALKPHOS 74 01/09/2023   AST 45 (H)  01/09/2023   ALT 18 01/09/2023   ANIONGAP 10 01/24/2023   Last lipids Lab Results  Component Value Date   CHOL 183 12/22/2022   HDL 51.50 12/22/2022   LDLCALC 112 (H) 12/22/2022   LDLDIRECT 132.3 01/28/2009   TRIG 100.0 12/22/2022   CHOLHDL 4 12/22/2022      The 09-WJXB ASCVD risk score (Arnett DK, et al., 2019) is: 10.7%    Assessment & Plan:  Late onset Alzheimer's disease with behavioral disturbance (HCC) Assessment & Plan: Filled out paperwork for patient to attend senior caregiving twice a week. Will send script to the insurance company to demonstrate medical necessity.   Orders: -     Vitamin B12  Essential hypertension Assessment & Plan: BP is stable without medication. I reviewed her set of labs from April 2, 24, kidney function WNL. Not on any medication for this currently.      Chronic depression Assessment & Plan: On sertraline 50 mg daily, doing well on this and the seroquel 50 mg at night for sleep. Will continue these medications as prescribed, follow up in 6 months.       Return in about 6 months (around 09/02/2023) for HTN.    Karie Georges, MD

## 2023-03-03 ENCOUNTER — Telehealth: Payer: Self-pay | Admitting: Family Medicine

## 2023-03-03 NOTE — Telephone Encounter (Signed)
[name] is requesting a call back--she states the pt didn't receive a message.  Just told [name] that she needed to call Randa Evens.  She stated she doesn't know what medication is being decided upon and is requesting that Randa Evens give her a call back, not the patient.

## 2023-03-06 MED ORDER — VITAMIN B-12 1000 MCG PO TABS: 1000.0000 ug | ORAL_TABLET | Freq: Every day | ORAL | 0 refills | Status: DC

## 2023-03-06 NOTE — Progress Notes (Signed)
Rx sent 

## 2023-03-06 NOTE — Telephone Encounter (Signed)
See results note. 

## 2023-03-06 NOTE — Addendum Note (Signed)
Addended by: Karie Georges on: 03/06/2023 04:31 PM   Modules accepted: Orders

## 2023-03-14 ENCOUNTER — Encounter: Payer: Self-pay | Admitting: Podiatry

## 2023-03-14 ENCOUNTER — Ambulatory Visit: Payer: Medicare PPO | Admitting: Podiatry

## 2023-03-14 DIAGNOSIS — B351 Tinea unguium: Secondary | ICD-10-CM | POA: Diagnosis not present

## 2023-03-14 DIAGNOSIS — M79676 Pain in unspecified toe(s): Secondary | ICD-10-CM | POA: Diagnosis not present

## 2023-03-14 DIAGNOSIS — L84 Corns and callosities: Secondary | ICD-10-CM | POA: Diagnosis not present

## 2023-03-14 NOTE — Progress Notes (Unsigned)
  Subjective:  Patient ID: [name], female    DOB: Apr 21, 1951,  MRN: [id number]  [name] presents to clinic today for {jgcomplaint:23593}  Chief Complaint  Patient presents with   Nail Problem    R[facility] PCP-Michael PCP VST- 02/2023   New problem(s): None.   PCP is Karie Georges, MD.  Allergies  Allergen Reactions   Erythromycin Base Nausea Only    Other Reaction(s): GI Intolerance   Other Nausea And Vomiting and Other (See Comments)    Mycins-dizziness    Review of Systems: Negative except as noted in the HPI.  Objective: No changes noted in today's physical examination. There were no vitals filed for this visit. [name] is a pleasant 72 y.o. female WD, WN in NAD. AAO x 3.  Vascular Examination: Capillary refill time immediate b/l.Vascular status intact b/l with palpable pedal pulses. Pedal hair present b/l. No edema. No pain with calf compression b/l. Skin temperature gradient WNL b/l.   Neurological Examination: Sensation grossly intact b/l with 10 gram monofilament. Vibratory sensation intact b/l.   Dermatological Examination: Pedal skin with normal turgor, texture and tone b/l. Toenails 1-5 b/l thick, discolored, elongated with subungual debris and pain on dorsal palpation.   Hyperkeratotic lesion(s) medial IPJ of left great toe, medial IPJ of right great toe, and submet head 1 right foot.  No erythema, no edema, no drainage, no fluctuance.  Musculoskeletal Examination: Normal muscle strength 5/5 to all lower extremity muscle groups bilaterally. No pain, crepitus or joint limitation noted with ROM b/l LE. No gross bony pedal deformities b/l. Patient ambulates independently without assistive aids.  Radiographs: None  Assessment/Plan: 1. Pain due to onychomycosis of toenail     No orders of the defined types were placed in this encounter.   None {Jgplan:23602::"-Patient/POA to call should there be question/concern in the interim."}    Return in about 3 months (around 06/14/2023).  Freddie Breech, DPM

## 2023-05-17 ENCOUNTER — Other Ambulatory Visit: Payer: Self-pay

## 2023-05-17 ENCOUNTER — Emergency Department (HOSPITAL_COMMUNITY): Payer: Medicare PPO

## 2023-05-17 ENCOUNTER — Emergency Department (HOSPITAL_COMMUNITY)
Admission: EM | Admit: 2023-05-17 | Discharge: 2023-05-17 | Disposition: A | Discharge status: Home / Self Care | Payer: Medicare PPO | Source: Home / Self Care | Attending: Emergency Medicine | Admitting: Emergency Medicine

## 2023-05-17 ENCOUNTER — Encounter (HOSPITAL_COMMUNITY): Payer: Self-pay | Admitting: Emergency Medicine

## 2023-05-17 ENCOUNTER — Observation Stay (HOSPITAL_COMMUNITY)
Admission: EM | Admit: 2023-05-17 | Discharge: 2023-05-19 | Disposition: A | Discharge status: Home / Self Care | Payer: Medicare PPO | Source: Home / Self Care | Attending: Emergency Medicine | Admitting: Emergency Medicine

## 2023-05-17 DIAGNOSIS — Y92003 Bedroom of unspecified non-institutional (private) residence as the place of occurrence of the external cause: Secondary | ICD-10-CM | POA: Insufficient documentation

## 2023-05-17 DIAGNOSIS — G309 Alzheimer's disease, unspecified: Secondary | ICD-10-CM | POA: Insufficient documentation

## 2023-05-17 DIAGNOSIS — D72829 Elevated white blood cell count, unspecified: Secondary | ICD-10-CM

## 2023-05-17 DIAGNOSIS — W19XXXA Unspecified fall, initial encounter: Secondary | ICD-10-CM | POA: Diagnosis not present

## 2023-05-17 DIAGNOSIS — W06XXXA Fall from bed, initial encounter: Secondary | ICD-10-CM | POA: Insufficient documentation

## 2023-05-17 DIAGNOSIS — N390 Urinary tract infection, site not specified: Secondary | ICD-10-CM | POA: Diagnosis not present

## 2023-05-17 DIAGNOSIS — I10 Essential (primary) hypertension: Secondary | ICD-10-CM | POA: Insufficient documentation

## 2023-05-17 DIAGNOSIS — Z79899 Other long term (current) drug therapy: Secondary | ICD-10-CM | POA: Insufficient documentation

## 2023-05-17 DIAGNOSIS — F0280 Dementia in other diseases classified elsewhere without behavioral disturbance: Secondary | ICD-10-CM | POA: Insufficient documentation

## 2023-05-17 DIAGNOSIS — R531 Weakness: Secondary | ICD-10-CM | POA: Diagnosis not present

## 2023-05-17 DIAGNOSIS — M25572 Pain in left ankle and joints of left foot: Secondary | ICD-10-CM | POA: Insufficient documentation

## 2023-05-17 DIAGNOSIS — R2681 Unsteadiness on feet: Secondary | ICD-10-CM | POA: Insufficient documentation

## 2023-05-17 DIAGNOSIS — Y92009 Unspecified place in unspecified non-institutional (private) residence as the place of occurrence of the external cause: Secondary | ICD-10-CM

## 2023-05-17 DIAGNOSIS — E876 Hypokalemia: Secondary | ICD-10-CM | POA: Insufficient documentation

## 2023-05-17 DIAGNOSIS — F0390 Unspecified dementia without behavioral disturbance: Secondary | ICD-10-CM | POA: Insufficient documentation

## 2023-05-17 LAB — CBC
HCT: 36.9 % (ref 36.0–46.0)
Hemoglobin: 12.9 g/dL (ref 12.0–15.0)
MCH: 31.7 pg (ref 26.0–34.0)
MCHC: 35 g/dL (ref 30.0–36.0)
MCV: 90.7 fL (ref 80.0–100.0)
Platelets: 267 10*3/uL (ref 150–400)
RBC: 4.07 MIL/uL (ref 3.87–5.11)
RDW: 11.8 % (ref 11.5–15.5)
WBC: 9.6 10*3/uL (ref 4.0–10.5)
nRBC: 0 % (ref 0.0–0.2)

## 2023-05-17 LAB — BASIC METABOLIC PANEL
Anion gap: 10 (ref 5–15)
Anion gap: 11 (ref 5–15)
BUN: 18 mg/dL (ref 8–23)
BUN: 19 mg/dL (ref 8–23)
CO2: 18 mmol/L — ABNORMAL LOW (ref 22–32)
CO2: 19 mmol/L — ABNORMAL LOW (ref 22–32)
Calcium: 8.9 mg/dL (ref 8.9–10.3)
Calcium: 9 mg/dL (ref 8.9–10.3)
Chloride: 110 mmol/L (ref 98–111)
Chloride: 107 mmol/L (ref 98–111)
Creatinine, Ser: 0.81 mg/dL (ref 0.44–1.00)
Creatinine, Ser: 1.01 mg/dL — ABNORMAL HIGH (ref 0.44–1.00)
GFR, Estimated: >60 mL/min (ref >60)
GFR, Estimated: 60 mL/min — ABNORMAL LOW (ref >60)
Glucose, Bld: 101 mg/dL — ABNORMAL HIGH (ref 70–99)
Glucose, Bld: 117 mg/dL — ABNORMAL HIGH (ref 70–99)
Potassium: 3.2 mmol/L — ABNORMAL LOW (ref 3.5–5.1)
Potassium: 2.9 mmol/L — ABNORMAL LOW (ref 3.5–5.1)
Sodium: 138 mmol/L (ref 135–145)
Sodium: 137 mmol/L (ref 135–145)

## 2023-05-17 LAB — URINALYSIS, W/ REFLEX TO CULTURE (INFECTION SUSPECTED)
Bilirubin Urine: NEGATIVE
Glucose, UA: NEGATIVE mg/dL
Ketones, ur: 5 mg/dL — AB
Leukocytes,Ua: NEGATIVE
Nitrite: NEGATIVE
Protein, ur: 30 mg/dL — AB
Specific Gravity, Urine: 1.019 (ref 1.005–1.030)
pH: 5 (ref 5.0–8.0)

## 2023-05-17 LAB — CBC WITH DIFFERENTIAL/PLATELET
Abs Immature Granulocytes: 0.03 10*3/uL (ref 0.00–0.07)
Basophils Absolute: 0 10*3/uL (ref 0.0–0.1)
Basophils Relative: 0 %
Eosinophils Absolute: 0 10*3/uL (ref 0.0–0.5)
Eosinophils Relative: 0 %
HCT: 39 % (ref 36.0–46.0)
Hemoglobin: 13.3 g/dL (ref 12.0–15.0)
Immature Granulocytes: 0 %
Lymphocytes Relative: 9 %
Lymphs Abs: 1.1 10*3/uL (ref 0.7–4.0)
MCH: 31.5 pg (ref 26.0–34.0)
MCHC: 34.1 g/dL (ref 30.0–36.0)
MCV: 92.4 fL (ref 80.0–100.0)
Monocytes Absolute: 1 10*3/uL (ref 0.1–1.0)
Monocytes Relative: 8 %
Neutro Abs: 11 10*3/uL — ABNORMAL HIGH (ref 1.7–7.7)
Neutrophils Relative %: 83 %
Platelets: 284 10*3/uL (ref 150–400)
RBC: 4.22 MIL/uL (ref 3.87–5.11)
RDW: 11.9 % (ref 11.5–15.5)
WBC: 13.2 10*3/uL — ABNORMAL HIGH (ref 4.0–10.5)
nRBC: 0 % (ref 0.0–0.2)

## 2023-05-17 MED ORDER — SODIUM CHLORIDE 0.9 % IV BOLUS
1000.0000 mL | Freq: Once | INTRAVENOUS | Status: AC
  Administered 2023-05-17: 1000 mL via INTRAVENOUS

## 2023-05-17 MED ORDER — SODIUM CHLORIDE 0.9 % IV SOLN
1.0000 g | Freq: Once | INTRAVENOUS | Status: AC
  Administered 2023-05-17: 1 g via INTRAVENOUS
  Filled 2023-05-17: qty 10

## 2023-05-17 MED ORDER — ACETAMINOPHEN 325 MG PO TABS: 650.0000 mg | ORAL_TABLET | Freq: Once | ORAL | Status: DC

## 2023-05-17 MED ORDER — POTASSIUM CHLORIDE CRYS ER 20 MEQ PO TBCR
60.0000 meq | EXTENDED_RELEASE_TABLET | Freq: Once | ORAL | Status: AC
  Administered 2023-05-17: 60 meq via ORAL
  Filled 2023-05-17: qty 3

## 2023-05-17 NOTE — ED Triage Notes (Signed)
Pt tripped and fell at home. Pt has no complaints.

## 2023-05-17 NOTE — Discharge Instructions (Signed)
The blood tests and x-rays did not show any signs of serious injury.  Follow-up with your doctor to be rechecked.  Return to the ED for worsening or recurrent symptoms

## 2023-05-17 NOTE — ED Notes (Signed)
Sister requests PT eval and possibly in-home rehab due to recurrent falls.

## 2023-05-17 NOTE — H&P (Signed)
History and Physical    Patient: [name] IHK:[id number] DOB: [birth year] DOA: 05/17/2023 DOS: the patient was seen and examined on 05/18/2023 PCP: Karie Georges, MD  Patient coming from: Home  Chief Complaint:  Chief Complaint  Patient presents with   Fall   HPI: [name] is a 72 y.o. female with medical history significant of hypertension, dementia who presents to the emergency department from home via EMS due to fall at home.  Patient was unable to provide history possibly due to underlying dementia, history was obtained from sister at bedside.  Per report, patient lives with her sister and at baseline, at baseline, patient was able to speak, though speech has been declining.  She reported to have slid out of bed this morning and was unable to get up, so EMS was activated patient was taken to the ED get upd get up. EMS was activated a patient was taken to the ED for further evaluation and management.  Apparently she has been complaining of some pain in her ankle.  X-rays done showed no acute injuries and patient was discharged home. Patient had another fall at home while standing up from a chair and hit the back of her head on the ground without loss of consciousness.  Patient's sister states that she has been more weak recently within the last few days.  There was no report or chest pain, shortness of breath, fever, chills, abdominal pain.  ED Course:  In the emergency department, BP was 95/79, other vital signs were within normal range.  Workup in the ED showed normal CBC except WBC of 13.2.  BMP showed sodium 137, potassium 2 point continue, BUN 19, creatinine 1.01.  Urinalysis showed many bacteria. CT head without contrast showed no acute intracranial process CT cervical spine without contrast showed degenerative changes of the cervical spine without acute abnormality Left ankle x-ray showed no evidence of fracture, dislocation or joint effusion Left hip x-ray showed  no evidence of hip fracture or dislocation. Patient was treated with IV ceftriaxone, potassium was replenished and IV hydration was provided.  Review of Systems: Review of systems as noted in the HPI. All other systems reviewed and are negative.   Past Medical History:  Diagnosis Date   Acute sialoadenitis 02/28/2011   B submand    Altered consciousness 02/17/2017   4/18  transient impairment of awareness ?etiology x 10 min ASA qd Dr Everlena Cooper   Alzheimer disease [facility])    CONTACT DERMATITIS&OTHER ECZEMA DUE TO PLANTS 02/13/2008   Qualifier: Diagnosis of  By: Yetta Barre MD, Bernadene Bell.    Depression    Hyperlipidemia    LBP (low back pain)    NONSPECIFIC ABNORMAL RESULTS LIVR FUNCTION STUDY 11/13/2007   Qualifier: Diagnosis of  By: Plotnikov MD, Georgina Quint    OA (osteoarthritis)    Paronychia of great toe of left foot 01/21/2013   3/14 The toenail is going to come off - likely s/p trauma 1 mo ago    Pneumonia due to organism 05/05/2014   7/15 R (clinical dx) 9/16 RML (CT)    Vitamin B 12 deficiency    Past Surgical History:  Procedure Laterality Date   CHOLECYSTECTOMY  1990    Social History:  reports that she has never smoked. She has never used smokeless tobacco. She reports that she does not drink alcohol and does not use drugs.   Allergies  Allergen Reactions   Erythromycin Base Nausea Only    Other Reaction(s): GI  Intolerance   Other Nausea And Vomiting and Other (See Comments)    Mycins-dizziness    Family History  Problem Relation Age of Onset   Pneumonia Father    Emphysema Father        smoker   Diabetes Mother    Heart disease Mother    Hypertension Mother    Pancreatic cancer Mother    Liver cancer Mother    Diabetes Other    Hypertension Other    Colon cancer Maternal Grandmother 40   Other Brother        died in infancy   Pneumonia Brother 73       was smoker     Prior to Admission medications   Medication Sig Start Date End Date Taking? Authorizing Provider   Cholecalciferol 1000 UNITS capsule Take 1,000 Units by mouth daily.   Yes [provider]  cyanocobalamin (VITAMIN B12) 1000 MCG tablet Take 1 tablet (1,000 mcg total) by mouth daily. For 30 days then reduce to 1/2 tablet daily for 30 days. 03/06/23  Yes Karie Georges, MD  donepezil (ARICEPT) 10 MG tablet TAKE 1 TABLET BY MOUTH EVERYDAY AT BEDTIME 12/19/22  Yes Karie Georges, MD  memantine (NAMENDA) 10 MG tablet TAKE 1 TABLET BY MOUTH TWICE A DAY 12/19/22  Yes Karie Georges, MD  QUEtiapine (SEROQUEL) 50 MG tablet Take 50 mg by mouth at bedtime. 02/15/23  Yes [provider]  sertraline (ZOLOFT) 50 MG tablet TAKE 1 TABLET BY MOUTH EVERY DAY 12/19/22  Yes Karie Georges, MD  ondansetron (ZOFRAN-ODT) 4 MG disintegrating tablet Take 1 tablet (4 mg total) by mouth every 8 (eight) hours as needed for nausea or vomiting. 01/13/23   Karie Georges, MD  Vitamin D, Ergocalciferol, (DRISDOL) 1.25 MG (50000 UNIT) CAPS capsule Take 1 capsule (50,000 Units total) by mouth every 7 (seven) days. 12/22/22   Karie Georges, MD    Physical Exam: BP (!) 152/68 (BP Location: Left Arm)   Pulse 90   Temp 98.4 F (36.9 C)   Resp 18   Ht 5\' 2"  (1.575 m)   Wt 54.8 kg   SpO2 95%   BMI 22.10 kg/m   General: 72 y.o. year-old female well developed well nourished in no acute distress.  Alert and oriented x3. HEENT: NCAT, EOMI Neck: Supple, trachea medial Cardiovascular: Regular rate and rhythm with no rubs or gallops.  No thyromegaly or JVD noted.  No lower extremity edema. 2/4 pulses in all 4 extremities. Respiratory: Clear to auscultation with no wheezes or rales. Good inspiratory effort. Abdomen: Soft, nontender nondistended with normal bowel sounds x4 quadrants. Muskuloskeletal: No cyanosis, clubbing or edema noted bilaterally Neuro: CN II-XII intact, sensation, reflexes intact Skin: No ulcerative lesions noted or rashes Psychiatry: Mood is appropriate for condition and  setting          Labs on Admission:  Basic Metabolic Panel: Recent Labs  Lab 05/17/23 1325 05/17/23 1947  NA 138 137  K 3.2* 2.9*  CL 110 107  CO2 18* 19*  GLUCOSE 101* 117*  BUN 18 19  CREATININE 0.81 1.01*  CALCIUM 8.9 9.0   Liver Function Tests: No results for input(s): "AST", "ALT", "ALKPHOS", "BILITOT", "PROT", "ALBUMIN" in the last 168 hours. No results for input(s): "LIPASE", "AMYLASE" in the last 168 hours. No results for input(s): "AMMONIA" in the last 168 hours. CBC: Recent Labs  Lab 05/17/23 1325 05/17/23 1947  WBC 9.6 13.2*  NEUTROABS  --  11.0*  HGB 12.9 13.3  HCT 36.9 39.0  MCV 90.7 92.4  PLT 267 284   Cardiac Enzymes: No results for input(s): "CKTOTAL", "CKMB", "CKMBINDEX", "TROPONINI" in the last 168 hours.  BNP (last 3 results) No results for input(s): "BNP" in the last 8760 hours.  ProBNP (last 3 results) No results for input(s): "PROBNP" in the last 8760 hours.  CBG: No results for input(s): "GLUCAP" in the last 168 hours.  Radiological Exams on Admission: CT Cervical Spine Wo Contrast  Result Date: 05/17/2023 CLINICAL DATA:  Recent fall with neck pain, initial encounter EXAM: CT CERVICAL SPINE WITHOUT CONTRAST TECHNIQUE: Multidetector CT imaging of the cervical spine was performed without intravenous contrast. Multiplanar CT image reconstructions were also generated. RADIATION DOSE REDUCTION: This exam was performed according to the departmental dose-optimization program which includes automated exposure control, adjustment of the mA and/or kV according to patient size and/or use of iterative reconstruction technique. COMPARISON:  01/24/2023 FINDINGS: Alignment: Within normal limits. Skull base and vertebrae: 7 cervical segments are well visualized. Vertebral body height is well maintained. Mild osteophytic changes are noted. Mild facet hypertrophic changes are noted as well. No acute fracture or acute facet abnormality is noted. Posterior fusion  defect at C1 is seen. Soft tissues and spinal canal: Surrounding soft tissue structures demonstrate scattered calcifications within the thyroid. No discrete nodule is noted. No other soft tissue changes are noted. Upper chest: Visualized lung apices are within normal limits. Other: None IMPRESSION: Degenerative changes of the cervical spine without acute abnormality. Electronically Signed   By: Alcide Clever M.D.   On: 05/17/2023 20:28   CT Head Wo Contrast  Result Date: 05/17/2023 CLINICAL DATA:  Fall, head trauma. EXAM: CT HEAD WITHOUT CONTRAST TECHNIQUE: Contiguous axial images were obtained from the base of the skull through the vertex without intravenous contrast. RADIATION DOSE REDUCTION: This exam was performed according to the departmental dose-optimization program which includes automated exposure control, adjustment of the mA and/or kV according to patient size and/or use of iterative reconstruction technique. COMPARISON:  Head CT 01/24/2023 FINDINGS: Brain: No evidence of acute infarction, hemorrhage, hydrocephalus, extra-axial collection or mass lesion/mass effect. There is mild diffuse atrophy. There is mild periventricular white matter hypodensity. Vascular: No hyperdense vessel or unexpected calcification. Skull: Normal. Negative for fracture or focal lesion. Sinuses/Orbits: No acute finding. Other: None. IMPRESSION: 1. No acute intracranial process. 2. Mild diffuse atrophy and mild periventricular white matter hypodensity, likely chronic microvascular disease. Electronically Signed   By: Darliss Cheney M.D.   On: 05/17/2023 20:23   DG Ankle Complete Left  Result Date: 05/17/2023 CLINICAL DATA:  Fall, left ankle pain EXAM: LEFT ANKLE COMPLETE - 3+ VIEW COMPARISON:  None Available. FINDINGS: There is no evidence of fracture, dislocation, or joint effusion. Mild degenerative spurring within the medial gutter of the ankle. Soft tissues are unremarkable. IMPRESSION: Negative. Electronically Signed    By: Duanne Guess D.O.   On: 05/17/2023 14:38   DG Hip Unilat W or Wo Pelvis 2-3 Views Left  Result Date: 05/17/2023 CLINICAL DATA:  Fall, left hip pain EXAM: DG HIP (WITH OR WITHOUT PELVIS) 2-3V LEFT COMPARISON:  01/09/2023 FINDINGS: There is no evidence of hip fracture or dislocation. There is no evidence of significant arthropathy or other focal bone abnormality. IMPRESSION: Negative. Electronically Signed   By: Duanne Guess D.O.   On: 05/17/2023 14:37    EKG: I independently viewed the EKG done and my findings are as followed: EKG was not done in  ED  Assessment/Plan Present on Admission:  Essential hypertension  Principal Problem:   Fall at home Active Problems:   Essential hypertension   UTI (urinary tract infection)   Hypokalemia   Leukocytosis   Dementia without behavioral disturbance (HCC)  Fall at home Continue fall precaution Continue PT/OT eval and treat TOC will be consulted for rehab at SNF  Presumed UTI POA Patient was empirically started on IV ceftriaxone, we shall continue with same at this time with plan to de-escalate/discontinue based on urine culture which is currently pending  Hypokalemia K+ 3.2, this was replenished  Leukocytosis possibly reactive vs infectious WBC 13.2, continue treatment as described for UTI  Essential hypertension No anti-hypertensive meds noted in med rec, we shall await updated med rec Continue hydralazine 10 mg every 6 hours as needed for SBP > 170  Dementia Continue Aricept, Namenda  DVT prophylaxis: Lovenox  Advance Care Planning: Full code  Consults: None  Family Communication: Sister at bedside (all questions answered to satisfaction)  Severity of Illness: The appropriate patient status for this patient is OBSERVATION. Observation status is judged to be reasonable and necessary in order to provide the required intensity of service to ensure the patient's safety. The patient's presenting symptoms, physical exam  findings, and initial radiographic and laboratory data in the context of their medical condition is felt to place them at decreased risk for further clinical deterioration. Furthermore, it is anticipated that the patient will be medically stable for discharge from the hospital within 2 midnights of admission.   Author: Frankey Shown, DO 05/18/2023 1:07 AM  For on call review [email or web address].

## 2023-05-17 NOTE — ED Notes (Signed)
ED TO INPATIENT HANDOFF REPORT  ED Nurse Name and Phone #:   S Name/Age/Gender [name] 72 y.o. female Room/Bed: APA01/APA01  Code Status   Code Status: Not on file  Home/SNF/Other Home Patient oriented to: self and place Is this baseline? Yes   Triage Complete: Triage complete  Chief Complaint Fall at home [phone], [phone]]  Triage Note Pt tripped and fell at home. Pt has no complaints.    Allergies Allergies  Allergen Reactions   Erythromycin Base Nausea Only    Other Reaction(s): GI Intolerance   Other Nausea And Vomiting and Other (See Comments)    Mycins-dizziness    Level of Care/Admitting Diagnosis ED Disposition     ED Disposition  Admit   Condition  --   Comment  Hospital Area: [name] [[id number]]  Level of Care: Med-Surg [16]  Covid Evaluation: Asymptomatic - no recent exposure (last 10 days) testing not required  Diagnosis: Fall at home [[id number]]  Admitting Physician: Frankey Shown [[id number]]  Attending Physician: Frankey Shown [[id number]]          B Medical/Surgery History Past Medical History:  Diagnosis Date   Acute sialoadenitis 02/28/2011   B submand    Altered consciousness 02/17/2017   4/18  transient impairment of awareness ?etiology x 10 min ASA qd Dr Everlena Cooper   Alzheimer disease [facility])    CONTACT DERMATITIS&OTHER ECZEMA DUE TO PLANTS 02/13/2008   Qualifier: Diagnosis of  By: Yetta Barre MD, Bernadene Bell.    Depression    Hyperlipidemia    LBP (low back pain)    NONSPECIFIC ABNORMAL RESULTS LIVR FUNCTION STUDY 11/13/2007   Qualifier: Diagnosis of  By: Plotnikov MD, Georgina Quint    OA (osteoarthritis)    Paronychia of great toe of left foot 01/21/2013   3/14 The toenail is going to come off - likely s/p trauma 1 mo ago    Pneumonia due to organism 05/05/2014   7/15 R (clinical dx) 9/16 RML (CT)    Vitamin B 12 deficiency    Past Surgical History:  Procedure Laterality Date   CHOLECYSTECTOMY  1990     A IV  Location/Drains/Wounds Patient Lines/Drains/Airways Status     Active Line/Drains/Airways     Name Placement date Placement time Site Days   Peripheral IV 05/17/23 22 G 1" Right Wrist 05/17/23  2110  Wrist  less than 1            Intake/Output Last 24 hours No intake or output data in the 24 hours ending 05/17/23 2305  Labs/Imaging Results for orders placed or performed during the hospital encounter of 05/17/23 (from the past 48 hour(s))  CBC with Differential     Status: Abnormal   Collection Time: 05/17/23  7:47 PM  Result Value Ref Range   WBC 13.2 (H) 4.0 - 10.5 K/uL   RBC 4.22 3.87 - 5.11 MIL/uL   Hemoglobin 13.3 12.0 - 15.0 g/dL   HCT 30.8 65.7 - 84.6 %   MCV 92.4 80.0 - 100.0 fL   MCH 31.5 26.0 - 34.0 pg   MCHC 34.1 30.0 - 36.0 g/dL   RDW 96.2 95.2 - 84.1 %   Platelets 284 150 - 400 K/uL   nRBC 0.0 0.0 - 0.2 %   Neutrophils Relative % 83 %   Neutro Abs 11.0 (H) 1.7 - 7.7 K/uL   Lymphocytes Relative 9 %   Lymphs Abs 1.1 0.7 - 4.0 K/uL   Monocytes Relative 8 %   Monocytes  Absolute 1.0 0.1 - 1.0 K/uL   Eosinophils Relative 0 %   Eosinophils Absolute 0.0 0.0 - 0.5 K/uL   Basophils Relative 0 %   Basophils Absolute 0.0 0.0 - 0.1 K/uL   Immature Granulocytes 0 %   Abs Immature Granulocytes 0.03 0.00 - 0.07 K/uL    Comment: Performed at [facility], [address] [place], [place] [address]  Basic metabolic panel     Status: Abnormal   Collection Time: 05/17/23  7:47 PM  Result Value Ref Range   Sodium 137 135 - 145 mmol/L   Potassium 2.9 (L) 3.5 - 5.1 mmol/L   Chloride 107 98 - 111 mmol/L   CO2 19 (L) 22 - 32 mmol/L   Glucose, Bld 117 (H) 70 - 99 mg/dL    Comment: Glucose reference range applies only to samples taken after fasting for at least 8 hours.   BUN 19 8 - 23 mg/dL   Creatinine, Ser 6.04 (H) 0.44 - 1.00 mg/dL   Calcium 9.0 8.9 - 54.0 mg/dL   GFR, Estimated 60 (L) >60 mL/min    Comment: (NOTE) Calculated using the CKD-EPI Creatinine Equation  (2021)    Anion gap 11 5 - 15    Comment: Performed at [facility], [address] [place], [place] [address]  Urinalysis, w/ Reflex to Culture (Infection Suspected) -Urine, Clean Catch     Status: Abnormal   Collection Time: 05/17/23  9:11 PM  Result Value Ref Range   Specimen Source URINE, CLEAN CATCH    Color, Urine YELLOW YELLOW   APPearance HAZY (A) CLEAR   Specific Gravity, Urine 1.019 1.005 - 1.030   pH 5.0 5.0 - 8.0   Glucose, UA NEGATIVE NEGATIVE mg/dL   Hgb urine dipstick SMALL (A) NEGATIVE   Bilirubin Urine NEGATIVE NEGATIVE   Ketones, ur 5 (A) NEGATIVE mg/dL   Protein, ur 30 (A) NEGATIVE mg/dL   Nitrite NEGATIVE NEGATIVE   Leukocytes,Ua NEGATIVE NEGATIVE   RBC / HPF 0-5 0 - 5 RBC/hpf   WBC, UA 11-20 0 - 5 WBC/hpf    Comment:        Reflex urine culture not performed if WBC <=10, OR if Squamous epithelial cells >5. If Squamous epithelial cells >5 suggest recollection.    Bacteria, UA MANY (A) NONE SEEN   Squamous Epithelial / HPF 0-5 0 - 5 /HPF   Mucus PRESENT     Comment: Performed at [facility], [address] [place], [place] [address]   CT Cervical Spine Wo Contrast  Result Date: 05/17/2023 CLINICAL DATA:  Recent fall with neck pain, initial encounter EXAM: CT CERVICAL SPINE WITHOUT CONTRAST TECHNIQUE: Multidetector CT imaging of the cervical spine was performed without intravenous contrast. Multiplanar CT image reconstructions were also generated. RADIATION DOSE REDUCTION: This exam was performed according to the departmental dose-optimization program which includes automated exposure control, adjustment of the mA and/or kV according to patient size and/or use of iterative reconstruction technique. COMPARISON:  01/24/2023 FINDINGS: Alignment: Within normal limits. Skull base and vertebrae: 7 cervical segments are well visualized. Vertebral body height is well maintained. Mild osteophytic changes are noted. Mild facet hypertrophic changes are noted as well. No  acute fracture or acute facet abnormality is noted. Posterior fusion defect at C1 is seen. Soft tissues and spinal canal: Surrounding soft tissue structures demonstrate scattered calcifications within the thyroid. No discrete nodule is noted. No other soft tissue changes are noted. Upper chest: Visualized lung apices are within normal limits. Other:  None IMPRESSION: Degenerative changes of the cervical spine without acute abnormality. Electronically Signed   By: Alcide Clever M.D.   On: 05/17/2023 20:28   CT Head Wo Contrast  Result Date: 05/17/2023 CLINICAL DATA:  Fall, head trauma. EXAM: CT HEAD WITHOUT CONTRAST TECHNIQUE: Contiguous axial images were obtained from the base of the skull through the vertex without intravenous contrast. RADIATION DOSE REDUCTION: This exam was performed according to the departmental dose-optimization program which includes automated exposure control, adjustment of the mA and/or kV according to patient size and/or use of iterative reconstruction technique. COMPARISON:  Head CT 01/24/2023 FINDINGS: Brain: No evidence of acute infarction, hemorrhage, hydrocephalus, extra-axial collection or mass lesion/mass effect. There is mild diffuse atrophy. There is mild periventricular white matter hypodensity. Vascular: No hyperdense vessel or unexpected calcification. Skull: Normal. Negative for fracture or focal lesion. Sinuses/Orbits: No acute finding. Other: None. IMPRESSION: 1. No acute intracranial process. 2. Mild diffuse atrophy and mild periventricular white matter hypodensity, likely chronic microvascular disease. Electronically Signed   By: Darliss Cheney M.D.   On: 05/17/2023 20:23   DG Ankle Complete Left  Result Date: 05/17/2023 CLINICAL DATA:  Fall, left ankle pain EXAM: LEFT ANKLE COMPLETE - 3+ VIEW COMPARISON:  None Available. FINDINGS: There is no evidence of fracture, dislocation, or joint effusion. Mild degenerative spurring within the medial gutter of the ankle. Soft  tissues are unremarkable. IMPRESSION: Negative. Electronically Signed   By: Duanne Guess D.O.   On: 05/17/2023 14:38   DG Hip Unilat W or Wo Pelvis 2-3 Views Left  Result Date: 05/17/2023 CLINICAL DATA:  Fall, left hip pain EXAM: DG HIP (WITH OR WITHOUT PELVIS) 2-3V LEFT COMPARISON:  01/09/2023 FINDINGS: There is no evidence of hip fracture or dislocation. There is no evidence of significant arthropathy or other focal bone abnormality. IMPRESSION: Negative. Electronically Signed   By: Duanne Guess D.O.   On: 05/17/2023 14:37    Pending Labs Unresulted Labs (From admission, onward)     Start     Ordered   05/17/23 2111  Urine Culture  Once,   R        05/17/23 2111            Vitals/Pain Today's Vitals   05/17/23 2215 05/17/23 2245 05/17/23 2247 05/17/23 2248  BP:  (!) 146/84    Pulse: 68 65 65   Resp:    15  Temp:    98 F (36.7 C)  TempSrc:    Oral  SpO2: 99% 100% 100%   Weight:      Height:      PainSc:    0-No pain    Isolation Precautions No active isolations  Medications Medications  cefTRIAXone (ROCEPHIN) 1 g in sodium chloride 0.9 % 100 mL IVPB (1 g Intravenous New Bag/Given 05/17/23 2245)  sodium chloride 0.9 % bolus 1,000 mL (0 mLs Intravenous Stopped 05/17/23 2221)  potassium chloride SA (KLOR-CON M) CR tablet 60 mEq (60 mEq Oral Given 05/17/23 2054)    Mobility Walks with assistance      Focused Assessments    R Recommendations: See Admitting Provider Note  Report given to:   Additional Notes:

## 2023-05-17 NOTE — ED Triage Notes (Signed)
Pt from home via [facility] with reports of rolling our of bed. Per family pt rolled off approx 2 mattresses that are lying on the ground.

## 2023-05-17 NOTE — ED Provider Notes (Signed)
[place] EMERGENCY DEPARTMENT AT [facility] Provider Note   CSN: [id number] Arrival date & time: 05/17/23  1229     History  Chief Complaint  Patient presents with   Marletta Lor    [name] is a 72 y.o. female.   Fall   Patient has history of depression and osteoarthritis, B12 deficiency Alzheimer's disease.  Family states at baseline patient is still able to speak although her speech is declining.  She ended up sliding out of her bed today.  She was unable to get up and had to call EMS.  Patient has been complaining of some pain in her ankle.  No loss of consciousness or headache.  No fevers or chills.  No vomiting or diarrhea.    Home Medications Prior to Admission medications   Medication Sig Start Date End Date Taking? Authorizing Provider  Cholecalciferol 1000 UNITS capsule Take 1,000 Units by mouth daily.    [provider]  cyanocobalamin (VITAMIN B12) 1000 MCG tablet Take 1 tablet (1,000 mcg total) by mouth daily. For 30 days then reduce to 1/2 tablet daily for 30 days. 03/06/23   Karie Georges, MD  donepezil (ARICEPT) 10 MG tablet TAKE 1 TABLET BY MOUTH EVERYDAY AT BEDTIME 12/19/22   Karie Georges, MD  memantine (NAMENDA) 10 MG tablet TAKE 1 TABLET BY MOUTH TWICE A DAY 12/19/22   Karie Georges, MD  ondansetron (ZOFRAN-ODT) 4 MG disintegrating tablet Take 1 tablet (4 mg total) by mouth every 8 (eight) hours as needed for nausea or vomiting. 01/13/23   Karie Georges, MD  QUEtiapine (SEROQUEL) 50 MG tablet Take 50 mg by mouth at bedtime. 02/15/23   [provider]  sertraline (ZOLOFT) 50 MG tablet TAKE 1 TABLET BY MOUTH EVERY DAY 12/19/22   Karie Georges, MD  Vitamin D, Ergocalciferol, (DRISDOL) 1.25 MG (50000 UNIT) CAPS capsule Take 1 capsule (50,000 Units total) by mouth every 7 (seven) days. 12/22/22   Karie Georges, MD      Allergies    Erythromycin base and Other    Review of Systems   Review of  Systems  Physical Exam Updated Vital Signs BP 138/70 (BP Location: Right Arm)   Pulse 66   Temp 98.2 F (36.8 C) (Oral)   Resp 18   SpO2 100%  Physical Exam Vitals and nursing note reviewed.  Constitutional:      General: She is not in acute distress.    Appearance: She is well-developed.  HENT:     Head: Normocephalic and atraumatic.     Right Ear: External ear normal.     Left Ear: External ear normal.  Eyes:     General: No scleral icterus.       Right eye: No discharge.        Left eye: No discharge.     Conjunctiva/sclera: Conjunctivae normal.  Neck:     Trachea: No tracheal deviation.  Cardiovascular:     Rate and Rhythm: Normal rate and regular rhythm.  Pulmonary:     Effort: Pulmonary effort is normal. No respiratory distress.     Breath sounds: Normal breath sounds. No stridor. No wheezing or rales.  Abdominal:     General: Bowel sounds are normal. There is no distension.     Palpations: Abdomen is soft.     Tenderness: There is no abdominal tenderness. There is no guarding or rebound.  Musculoskeletal:        General: Tenderness present.  No swelling or deformity.     Cervical back: Normal and neck supple.     Thoracic back: Normal.     Lumbar back: Normal.     Comments: Tenderness palpation left ankle, full range of motion left hip, no swelling or edema, no ecchymoses  Skin:    General: Skin is warm and dry.     Findings: No rash.  Neurological:     General: No focal deficit present.     Mental Status: She is alert. Mental status is at baseline.     Cranial Nerves: No cranial nerve deficit, dysarthria or facial asymmetry.     Sensory: No sensory deficit.     Motor: No weakness, abnormal muscle tone or seizure activity.     Coordination: Coordination normal.     Comments: Patient is able to lift arms and legs off the bed  Psychiatric:        Mood and Affect: Mood normal.     ED Results / Procedures / Treatments   Labs (all labs ordered are listed,  but only abnormal results are displayed) Labs Reviewed  BASIC METABOLIC PANEL - Abnormal; Notable for the following components:      Result Value   Potassium 3.2 (*)    CO2 18 (*)    Glucose, Bld 101 (*)    All other components within normal limits  CBC    EKG None  Radiology DG Ankle Complete Left  Result Date: 05/17/2023 CLINICAL DATA:  Fall, left ankle pain EXAM: LEFT ANKLE COMPLETE - 3+ VIEW COMPARISON:  None Available. FINDINGS: There is no evidence of fracture, dislocation, or joint effusion. Mild degenerative spurring within the medial gutter of the ankle. Soft tissues are unremarkable. IMPRESSION: Negative. Electronically Signed   By: Duanne Guess D.O.   On: 05/17/2023 14:38   DG Hip Unilat W or Wo Pelvis 2-3 Views Left  Result Date: 05/17/2023 CLINICAL DATA:  Fall, left hip pain EXAM: DG HIP (WITH OR WITHOUT PELVIS) 2-3V LEFT COMPARISON:  01/09/2023 FINDINGS: There is no evidence of hip fracture or dislocation. There is no evidence of significant arthropathy or other focal bone abnormality. IMPRESSION: Negative. Electronically Signed   By: Duanne Guess D.O.   On: 05/17/2023 14:37    Procedures Procedures    Medications Ordered in ED Medications  acetaminophen (TYLENOL) tablet 650 mg (650 mg Oral Patient Refused/Not Given 05/17/23 1355)    ED Course/ Medical Decision Making/ A&P Clinical Course as of 05/17/23 1555  Wed May 17, 2023  1553 Patient was able to ambulate in the ED.  Her labs have been reviewed.  Mild hypokalemia decreased bicarb noted doubt clinically significant.  CBC normal.  X-rays without acute findings [JK]    Clinical Course User Index [JK] Linwood Dibbles, MD                             Medical Decision Making Problems Addressed: Fall, initial encounter: acute illness or injury  Amount and/or Complexity of Data Reviewed Labs: ordered. Decision-making details documented in ED Course. Radiology: ordered and independent interpretation  performed.  Risk OTC drugs.   Patient slid out of the bed today.  Patient does have advanced dementia and family has noted that she has having some progressive weakness over time.  No acute injuries noted on x-rays.  Patient does not have any focal neurologic deficits was able to ambulate in ED.  I doubt acute stroke or serious  head injury.  Mild electrolyte abnormalities noted.  Encouraged diet, vegetables do not help with potassium.  Family asked about home health and physical therapy which she has had in the past.  Will place an outpatient referral.  Evaluation and diagnostic testing in the emergency department does not suggest an emergent condition requiring admission or immediate intervention beyond what has been performed at this time.  The patient is safe for discharge and has been instructed to return immediately for worsening symptoms, change in symptoms or any other concerns.        Final Clinical Impression(s) / ED Diagnoses Final diagnoses:  Fall, initial encounter    Rx / DC Orders ED Discharge Orders          Ordered    Home Health        05/17/23 1542    Face-to-face encounter (required for Medicare/Medicaid patients)       Comments: I Linwood Dibbles certify that this patient is under my care and that I, or a nurse practitioner or physician's assistant working with me, had a face-to-face encounter that meets the physician face-to-face encounter requirements with this patient on 05/17/2023. The encounter with the patient was in whole, or in part for the following medical condition(s) which is the primary reason for home health care (List medical condition): dementia, weakness   05/17/23 1542              Linwood Dibbles, MD 05/17/23 1555

## 2023-05-17 NOTE — ED Provider Notes (Signed)
[place] EMERGENCY DEPARTMENT AT [facility] Provider Note  CSN: [id number] Arrival date & time: 05/17/23 4098  Chief Complaint(s) Fall  HPI [name] is a 72 y.o. female history of dementia, hyperlipidemia, presenting to the emergency department with fall.  Patient was seen earlier for fall, slid out of bed.  Had x-rays performed and was discharged.  Patient's sister who she lives with reports that patient has been more weak recently over the past few days.  At home she had another fall when standing up out of a chair.  She hit the back of her head on the ground.  No loss of consciousness.  Patient's sister denies any recent fevers or chills, vomiting, diarrhea. patient unable to provide really any history due to her dementia but denies pain anywhere currently.  History limited due to dementia.   Past Medical History Past Medical History:  Diagnosis Date   Acute sialoadenitis 02/28/2011   B submand    Altered consciousness 02/17/2017   4/18  transient impairment of awareness ?etiology x 10 min ASA qd Dr Everlena Cooper   Alzheimer disease [facility])    CONTACT DERMATITIS&OTHER ECZEMA DUE TO PLANTS 02/13/2008   Qualifier: Diagnosis of  By: Yetta Barre MD, Bernadene Bell.    Depression    Hyperlipidemia    LBP (low back pain)    NONSPECIFIC ABNORMAL RESULTS LIVR FUNCTION STUDY 11/13/2007   Qualifier: Diagnosis of  By: Plotnikov MD, Georgina Quint    OA (osteoarthritis)    Paronychia of great toe of left foot 01/21/2013   3/14 The toenail is going to come off - likely s/p trauma 1 mo ago    Pneumonia due to organism 05/05/2014   7/15 R (clinical dx) 9/16 RML (CT)    Vitamin B 12 deficiency    Patient Active Problem List   Diagnosis Date Noted   Fall at home 05/17/2023   Late onset Alzheimer's disease with behavioral disturbance (HCC) 08/05/2021   Memory problem 06/12/2019   Alopecia areata 05/30/2014   Tendinopathy of left biceps tendon 10/04/2013   Trigger finger, acquired 10/04/2013   Left  shoulder pain 09/03/2013   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 Authorizing Provider  Cholecalciferol 1000 UNITS capsule Take 1,000 Units by mouth daily.   Yes [provider]  cyanocobalamin (VITAMIN B12) 1000 MCG tablet Take 1 tablet (1,000 mcg total) by mouth daily. For 30 days then reduce to 1/2 tablet daily for 30 days. 03/06/23  Yes Karie Georges, MD  donepezil (ARICEPT) 10 MG tablet TAKE 1 TABLET BY MOUTH EVERYDAY AT BEDTIME 12/19/22  Yes Karie Georges, MD  memantine (NAMENDA) 10 MG tablet TAKE 1 TABLET BY MOUTH TWICE A DAY 12/19/22  Yes Karie Georges, MD  QUEtiapine (SEROQUEL) 50 MG tablet Take 50 mg by mouth at bedtime. 02/15/23  Yes [provider]  sertraline (ZOLOFT) 50 MG tablet TAKE 1 TABLET BY MOUTH EVERY DAY 12/19/22  Yes Karie Georges, MD  ondansetron (ZOFRAN-ODT) 4 MG disintegrating tablet Take 1 tablet (4 mg total) by mouth every 8 (eight) hours as needed for nausea or vomiting. 01/13/23  Karie Georges, MD  Vitamin D, Ergocalciferol, (DRISDOL) 1.25 MG (50000 UNIT) CAPS capsule Take 1 capsule (50,000 Units total) by mouth every 7 (seven) days. 12/22/22   Karie Georges, MD                                                                                                                                    Past Surgical History Past Surgical History:  Procedure Laterality Date   CHOLECYSTECTOMY  75   Family History Family History  Problem Relation Age of Onset   Pneumonia Father    Emphysema Father        smoker   Diabetes Mother     Heart disease Mother    Hypertension Mother    Pancreatic cancer Mother    Liver cancer Mother    Diabetes Other    Hypertension Other    Colon cancer Maternal Grandmother 82   Other Brother        died in infancy   Pneumonia Brother 48       was smoker    Social History Social History   Tobacco Use   Smoking status: Never   Smokeless tobacco: Never  Vaping Use   Vaping status: Never Used  Substance Use Topics   Alcohol use: No   Drug use: No   Allergies Erythromycin base and Other  Review of Systems Review of Systems  Unable to perform ROS: Dementia    Physical Exam Vital Signs  I have reviewed the triage vital signs BP 95/79   Pulse 68   Temp 97.8 F (36.6 C)   Resp 16   Ht 5\' 2"  (1.575 m)   Wt 57.9 kg   SpO2 99%   BMI 23.35 kg/m  Physical Exam Vitals and nursing note reviewed.  Constitutional:      General: She is not in acute distress.    Appearance: She is well-developed.  HENT:     Head: Normocephalic and atraumatic.     Mouth/Throat:     Mouth: Mucous membranes are moist.  Eyes:     Pupils: Pupils are equal, round, and reactive to light.  Cardiovascular:     Rate and Rhythm: Normal rate and regular rhythm.     Heart sounds: No murmur heard. Pulmonary:     Effort: Pulmonary effort is normal. No respiratory distress.     Breath sounds: Normal breath sounds.  Abdominal:     General: Abdomen is flat.     Palpations: Abdomen is soft.     Tenderness: There is no abdominal tenderness.  Musculoskeletal:        General: No tenderness.     Right lower leg: No edema.     Left lower leg: No edema.     Comments: No midline C, T, L-spine tenderness.  Full range of motion of bilateral upper and lower extremities with no focal tenderness or deformity.  Skin:  General: Skin is warm and dry.  Neurological:     General: No focal deficit present.     Mental Status: She is alert. Mental status is at baseline.  Psychiatric:        Mood and Affect: Mood  normal.        Behavior: Behavior normal.     ED Results and Treatments Labs (all labs ordered are listed, but only abnormal results are displayed) Labs Reviewed  CBC WITH DIFFERENTIAL/PLATELET - Abnormal; Notable for the following components:      Result Value   WBC 13.2 (*)    Neutro Abs 11.0 (*)    All other components within normal limits  BASIC METABOLIC PANEL - Abnormal; Notable for the following components:   Potassium 2.9 (*)    CO2 19 (*)    Glucose, Bld 117 (*)    Creatinine, Ser 1.01 (*)    GFR, Estimated 60 (*)    All other components within normal limits  URINALYSIS, W/ REFLEX TO CULTURE (INFECTION SUSPECTED) - Abnormal; Notable for the following components:   APPearance HAZY (*)    Hgb urine dipstick SMALL (*)    Ketones, ur 5 (*)    Protein, ur 30 (*)    Bacteria, UA MANY (*)    All other components within normal limits  URINE CULTURE                                                                                                                          Radiology CT Cervical Spine Wo Contrast  Result Date: 05/17/2023 CLINICAL DATA:  Recent fall with neck pain, initial encounter EXAM: CT CERVICAL SPINE WITHOUT CONTRAST TECHNIQUE: Multidetector CT imaging of the cervical spine was performed without intravenous contrast. Multiplanar CT image reconstructions were also generated. RADIATION DOSE REDUCTION: This exam was performed according to the departmental dose-optimization program which includes automated exposure control, adjustment of the mA and/or kV according to patient size and/or use of iterative reconstruction technique. COMPARISON:  01/24/2023 FINDINGS: Alignment: Within normal limits. Skull base and vertebrae: 7 cervical segments are well visualized. Vertebral body height is well maintained. Mild osteophytic changes are noted. Mild facet hypertrophic changes are noted as well. No acute fracture or acute facet abnormality is noted. Posterior fusion defect at C1  is seen. Soft tissues and spinal canal: Surrounding soft tissue structures demonstrate scattered calcifications within the thyroid. No discrete nodule is noted. No other soft tissue changes are noted. Upper chest: Visualized lung apices are within normal limits. Other: None IMPRESSION: Degenerative changes of the cervical spine without acute abnormality. Electronically Signed   By: Alcide Clever M.D.   On: 05/17/2023 20:28   CT Head Wo Contrast  Result Date: 05/17/2023 CLINICAL DATA:  Fall, head trauma. EXAM: CT HEAD WITHOUT CONTRAST TECHNIQUE: Contiguous axial images were obtained from the base of the skull through the vertex without intravenous contrast. RADIATION DOSE REDUCTION: This exam was performed according to the departmental dose-optimization program which  includes automated exposure control, adjustment of the mA and/or kV according to patient size and/or use of iterative reconstruction technique. COMPARISON:  Head CT 01/24/2023 FINDINGS: Brain: No evidence of acute infarction, hemorrhage, hydrocephalus, extra-axial collection or mass lesion/mass effect. There is mild diffuse atrophy. There is mild periventricular white matter hypodensity. Vascular: No hyperdense vessel or unexpected calcification. Skull: Normal. Negative for fracture or focal lesion. Sinuses/Orbits: No acute finding. Other: None. IMPRESSION: 1. No acute intracranial process. 2. Mild diffuse atrophy and mild periventricular white matter hypodensity, likely chronic microvascular disease. Electronically Signed   By: Darliss Cheney M.D.   On: 05/17/2023 20:23   DG Ankle Complete Left  Result Date: 05/17/2023 CLINICAL DATA:  Fall, left ankle pain EXAM: LEFT ANKLE COMPLETE - 3+ VIEW COMPARISON:  None Available. FINDINGS: There is no evidence of fracture, dislocation, or joint effusion. Mild degenerative spurring within the medial gutter of the ankle. Soft tissues are unremarkable. IMPRESSION: Negative. Electronically Signed   By: Duanne Guess D.O.   On: 05/17/2023 14:38   DG Hip Unilat W or Wo Pelvis 2-3 Views Left  Result Date: 05/17/2023 CLINICAL DATA:  Fall, left hip pain EXAM: DG HIP (WITH OR WITHOUT PELVIS) 2-3V LEFT COMPARISON:  01/09/2023 FINDINGS: There is no evidence of hip fracture or dislocation. There is no evidence of significant arthropathy or other focal bone abnormality. IMPRESSION: Negative. Electronically Signed   By: Duanne Guess D.O.   On: 05/17/2023 14:37    Pertinent labs & imaging results that were available during my care of the patient were reviewed by me and considered in my medical decision making (see MDM for details).  Medications Ordered in ED Medications  cefTRIAXone (ROCEPHIN) 1 g in sodium chloride 0.9 % 100 mL IVPB (1 g Intravenous New Bag/Given 05/17/23 2245)  sodium chloride 0.9 % bolus 1,000 mL (0 mLs Intravenous Stopped 05/17/23 2221)  potassium chloride SA (KLOR-CON M) CR tablet 60 mEq (60 mEq Oral Given 05/17/23 2054)                                                                                                                                     Procedures Procedures  (including critical care time)  Medical Decision Making / ED Course   MDM:  72 year old female presenting to the emergency department with fall, mild increased weakness.  This patient's second ER visit today for fall.  Patient was seen earlier and discharged with plan for home health.  Patient's sister reports that she has had increased weakness over the past few days.  Reviewed labs from earlier, she had mild hypokalemia on recheck today it is worsened, she also has a mild AKI.  She has leukocytosis which is possibly related to dehydration but urinalysis does have findings of possible urinary infection.  Patient unable to provide significant history about whether or not she has any symptoms, given increased weakness we will treat.  No sign  of traumatic injury from her fall.  CT head and CT cervical spine  negative.  Think patient would benefit from inpatient physical therapy evaluation and possible placement in inpatient rehab given increased weakness lately.  Discussed with hospitalist who will admit the patient      Additional history obtained: -Additional history obtained from caregiver -External records from outside source obtained and reviewed including: Chart review including previous notes, labs, imaging, consultation notes including prior ER visit today   Lab Tests: -I ordered, reviewed, and interpreted labs.   The pertinent results include:   Labs Reviewed  CBC WITH DIFFERENTIAL/PLATELET - Abnormal; Notable for the following components:      Result Value   WBC 13.2 (*)    Neutro Abs 11.0 (*)    All other components within normal limits  BASIC METABOLIC PANEL - Abnormal; Notable for the following components:   Potassium 2.9 (*)    CO2 19 (*)    Glucose, Bld 117 (*)    Creatinine, Ser 1.01 (*)    GFR, Estimated 60 (*)    All other components within normal limits  URINALYSIS, W/ REFLEX TO CULTURE (INFECTION SUSPECTED) - Abnormal; Notable for the following components:   APPearance HAZY (*)    Hgb urine dipstick SMALL (*)    Ketones, ur 5 (*)    Protein, ur 30 (*)    Bacteria, UA MANY (*)    All other components within normal limits  URINE CULTURE    Notable for leukocytosis, mild AKI, mild hypokalemia, signs of UTI   Imaging Studies ordered: I ordered imaging studies including CT head, CT cervical spine On my interpretation imaging demonstrates no acute process I independently visualized and interpreted imaging. I agree with the radiologist interpretation   Medicines ordered and prescription drug management: Meds ordered this encounter  Medications   sodium chloride 0.9 % bolus 1,000 mL   potassium chloride SA (KLOR-CON M) CR tablet 60 mEq   cefTRIAXone (ROCEPHIN) 1 g in sodium chloride 0.9 % 100 mL IVPB    Order Specific Question:   Antibiotic Indication:     Answer:   UTI    -I have reviewed the patients home medicines and have made adjustments as needed   Consultations Obtained: I requested consultation with the hospitalist,  and discussed lab and imaging findings as well as pertinent plan - they recommend: admission   Cardiac Monitoring: The patient was maintained on a cardiac monitor.  I personally viewed and interpreted the cardiac monitored which showed an underlying rhythm of: NSR    Reevaluation: After the interventions noted above, I reevaluated the patient and found that their symptoms have improved  Co morbidities that complicate the patient evaluation  Past Medical History:  Diagnosis Date   Acute sialoadenitis 02/28/2011   B submand    Altered consciousness 02/17/2017   4/18  transient impairment of awareness ?etiology x 10 min ASA qd Dr Everlena Cooper   Alzheimer disease [facility])    CONTACT DERMATITIS&OTHER ECZEMA DUE TO PLANTS 02/13/2008   Qualifier: Diagnosis of  By: Yetta Barre MD, Bernadene Bell.    Depression    Hyperlipidemia    LBP (low back pain)    NONSPECIFIC ABNORMAL RESULTS LIVR FUNCTION STUDY 11/13/2007   Qualifier: Diagnosis of  By: Plotnikov MD, Georgina Quint    OA (osteoarthritis)    Paronychia of great toe of left foot 01/21/2013   3/14 The toenail is going to come off - likely s/p trauma 1 mo ago  Pneumonia due to organism 05/05/2014   7/15 R (clinical dx) 9/16 RML (CT)    Vitamin B 12 deficiency       Dispostion: Disposition decision including need for hospitalization was considered, and patient admitted to the hospital.    Final Clinical Impression(s) / ED Diagnoses Final diagnoses:  Generalized weakness  Urinary tract infection without hematuria, site unspecified     This chart was dictated using voice recognition software.  Despite best efforts to proofread,  errors can occur which can change the documentation meaning.    Lonell Grandchild, MD 05/17/23 2248

## 2023-05-17 NOTE — ED Notes (Signed)
Pt ambulated w/o assistance. No incident to note.

## 2023-05-18 ENCOUNTER — Observation Stay (HOSPITAL_COMMUNITY): Payer: Medicare PPO

## 2023-05-18 DIAGNOSIS — D72829 Elevated white blood cell count, unspecified: Secondary | ICD-10-CM | POA: Insufficient documentation

## 2023-05-18 DIAGNOSIS — F0390 Unspecified dementia without behavioral disturbance: Secondary | ICD-10-CM | POA: Insufficient documentation

## 2023-05-18 DIAGNOSIS — N390 Urinary tract infection, site not specified: Secondary | ICD-10-CM | POA: Insufficient documentation

## 2023-05-18 DIAGNOSIS — W19XXXD Unspecified fall, subsequent encounter: Secondary | ICD-10-CM

## 2023-05-18 DIAGNOSIS — R531 Weakness: Secondary | ICD-10-CM

## 2023-05-18 DIAGNOSIS — N3000 Acute cystitis without hematuria: Secondary | ICD-10-CM

## 2023-05-18 DIAGNOSIS — R2681 Unsteadiness on feet: Secondary | ICD-10-CM

## 2023-05-18 DIAGNOSIS — E876 Hypokalemia: Secondary | ICD-10-CM | POA: Insufficient documentation

## 2023-05-18 LAB — CBC
HCT: 35.1 % — ABNORMAL LOW (ref 36.0–46.0)
Hemoglobin: 11.7 g/dL — ABNORMAL LOW (ref 12.0–15.0)
MCH: 31.5 pg (ref 26.0–34.0)
MCHC: 33.3 g/dL (ref 30.0–36.0)
MCV: 94.4 fL (ref 80.0–100.0)
Platelets: 244 10*3/uL (ref 150–400)
RBC: 3.72 MIL/uL — ABNORMAL LOW (ref 3.87–5.11)
RDW: 11.9 % (ref 11.5–15.5)
WBC: 10.7 10*3/uL — ABNORMAL HIGH (ref 4.0–10.5)
nRBC: 0 % (ref 0.0–0.2)

## 2023-05-18 LAB — COMPREHENSIVE METABOLIC PANEL
ALT: 12 U/L (ref 0–44)
AST: 23 U/L (ref 15–41)
Albumin: 3.6 g/dL (ref 3.5–5.0)
Alkaline Phosphatase: 62 U/L (ref 38–126)
Anion gap: 7 (ref 5–15)
BUN: 14 mg/dL (ref 8–23)
CO2: 20 mmol/L — ABNORMAL LOW (ref 22–32)
Calcium: 8.5 mg/dL — ABNORMAL LOW (ref 8.9–10.3)
Chloride: 112 mmol/L — ABNORMAL HIGH (ref 98–111)
Creatinine, Ser: 0.81 mg/dL (ref 0.44–1.00)
GFR, Estimated: >60 mL/min (ref >60)
Glucose, Bld: 86 mg/dL (ref 70–99)
Potassium: 3.3 mmol/L — ABNORMAL LOW (ref 3.5–5.1)
Sodium: 139 mmol/L (ref 135–145)
Total Bilirubin: 1 mg/dL (ref 0.3–1.2)
Total Protein: 6.8 g/dL (ref 6.5–8.1)

## 2023-05-18 LAB — CK: Total CK: 388 U/L — ABNORMAL HIGH (ref 38–234)

## 2023-05-18 LAB — T4, FREE: Free T4: 1.05 ng/dL (ref 0.61–1.12)

## 2023-05-18 LAB — PHOSPHORUS: Phosphorus: 1.7 mg/dL — ABNORMAL LOW (ref 2.5–4.6)

## 2023-05-18 LAB — TSH: TSH: 1.277 u[IU]/mL (ref 0.350–4.500)

## 2023-05-18 LAB — VITAMIN B12: Vitamin B-12: 197 pg/mL (ref 180–914)

## 2023-05-18 LAB — MAGNESIUM: Magnesium: 2.1 mg/dL (ref 1.7–2.4)

## 2023-05-18 LAB — FOLATE: Folate: 10.2 ng/mL (ref >5.9)

## 2023-05-18 MED ORDER — ONDANSETRON HCL 4 MG PO TABS: 4.0000 mg | ORAL_TABLET | Freq: Four times a day (QID) | ORAL | Status: DC | PRN

## 2023-05-18 MED ORDER — POTASSIUM CHLORIDE CRYS ER 20 MEQ PO TBCR
30.0000 meq | EXTENDED_RELEASE_TABLET | Freq: Once | ORAL | Status: AC
  Administered 2023-05-18: 30 meq via ORAL
  Filled 2023-05-18: qty 1

## 2023-05-18 MED ORDER — HYDRALAZINE HCL 20 MG/ML IJ SOLN: 10.0000 mg | Freq: Four times a day (QID) | INTRAMUSCULAR | Status: DC | PRN

## 2023-05-18 MED ORDER — VITAMIN B-12 100 MCG PO TABS: 500.0000 ug | ORAL_TABLET | Freq: Every day | ORAL | Status: DC

## 2023-05-18 MED ORDER — ACETAMINOPHEN 650 MG RE SUPP: 650.0000 mg | Freq: Four times a day (QID) | RECTAL | Status: DC | PRN

## 2023-05-18 MED ORDER — ADULT MULTIVITAMIN W/MINERALS CH
1.0000 | ORAL_TABLET | Freq: Every day | ORAL | Status: DC
  Administered 2023-05-18 – 2023-05-19 (×2): 1 via ORAL
  Filled 2023-05-18 (×2): qty 1

## 2023-05-18 MED ORDER — ENOXAPARIN SODIUM 40 MG/0.4ML IJ SOSY
40.0000 mg | PREFILLED_SYRINGE | INTRAMUSCULAR | Status: DC
  Administered 2023-05-18 – 2023-05-19 (×2): 40 mg via SUBCUTANEOUS
  Filled 2023-05-18 (×2): qty 0.4

## 2023-05-18 MED ORDER — ENSURE ENLIVE PO LIQD
237.0000 mL | Freq: Two times a day (BID) | ORAL | Status: DC
  Administered 2023-05-18 – 2023-05-19 (×3): 237 mL via ORAL

## 2023-05-18 MED ORDER — CYANOCOBALAMIN 1000 MCG/ML IJ SOLN
1000.0000 ug | Freq: Once | INTRAMUSCULAR | Status: AC
  Administered 2023-05-18: 1000 ug via INTRAMUSCULAR
  Filled 2023-05-18: qty 1

## 2023-05-18 MED ORDER — MEMANTINE HCL 10 MG PO TABS
10.0000 mg | ORAL_TABLET | Freq: Two times a day (BID) | ORAL | Status: DC
  Administered 2023-05-18 – 2023-05-19 (×4): 10 mg via ORAL
  Filled 2023-05-18 (×4): qty 1

## 2023-05-18 MED ORDER — POTASSIUM CHLORIDE CRYS ER 20 MEQ PO TBCR: 20.0000 meq | EXTENDED_RELEASE_TABLET | Freq: Once | ORAL | Status: DC

## 2023-05-18 MED ORDER — ACETAMINOPHEN 325 MG PO TABS: 650.0000 mg | ORAL_TABLET | Freq: Four times a day (QID) | ORAL | Status: DC | PRN

## 2023-05-18 MED ORDER — K PHOS MONO-SOD PHOS DI & MONO 155-852-130 MG PO TABS
500.0000 mg | ORAL_TABLET | Freq: Two times a day (BID) | ORAL | Status: DC
  Administered 2023-05-18 – 2023-05-19 (×3): 500 mg via ORAL
  Filled 2023-05-18 (×3): qty 2

## 2023-05-18 MED ORDER — QUETIAPINE FUMARATE 25 MG PO TABS
50.0000 mg | ORAL_TABLET | Freq: Every day | ORAL | Status: DC
  Administered 2023-05-18: 50 mg via ORAL
  Filled 2023-05-18: qty 2

## 2023-05-18 MED ORDER — SODIUM CHLORIDE 0.9 % IV SOLN
1.0000 g | INTRAVENOUS | Status: DC
  Administered 2023-05-18 – 2023-05-19 (×2): 1 g via INTRAVENOUS
  Filled 2023-05-18 (×2): qty 10

## 2023-05-18 MED ORDER — DONEPEZIL HCL 5 MG PO TABS
10.0000 mg | ORAL_TABLET | Freq: Every day | ORAL | Status: DC
  Administered 2023-05-18: 10 mg via ORAL
  Filled 2023-05-18: qty 2

## 2023-05-18 MED ORDER — LACTATED RINGERS IV SOLN: INTRAVENOUS | Status: AC

## 2023-05-18 MED ORDER — ONDANSETRON HCL 4 MG/2ML IJ SOLN: 4.0000 mg | Freq: Four times a day (QID) | INTRAMUSCULAR | Status: DC | PRN

## 2023-05-18 NOTE — Progress Notes (Addendum)
PROGRESS NOTE  Allison MCCLISH OZD:[id number] DOB: [birth year] DOA: 05/17/2023 PCP: Karie Georges, MD  Brief History:  72 year old female with a history of dementia, hypertension, hyperlipidemia, B12 deficiency presenting with unsteady gait and falling.  The patient initially slid out of bed and was not able to get up.  EMS was activated and the patient was brought to the emergency department.  An x-ray of her left ankle was performed and was negative.  Labs were otherwise reassuring except for potassium of 2.9.  The patient was subsequently discharged home.  After discharge home, the patient had a second fall trying to get up from the chair.  Apparently she hit her head on the ground without loss of consciousness.  The patient was brought back to the emergency department for further evaluation and treatment a second time. The patient is a poor historian secondary to her dementia.  History is supplemented by the patient's sister with whom the patient lives.  Sister states that the patient has had a cognitive decline over the last 6 months.  Functionally, the patient is able to ambulate without assistance.  She has not had a history of falls prior to the current events.  There is been no recent fevers, chills, chest pain, shortness breath, nausea, vomiting, diarrhea, abdominal pain.  Oral intake has been good according to the sister.  The patient does require assistance with her ADLs, but the patient is able to feed herself.  Sister states that the patient's mental status is near baseline presently. In the ED, the patient was afebrile hemodynamically stable with oxygen saturation 90% room air.  WBC 13.2, hemoglobin 13.3, platelets 284,000.  Sodium 137, potassium 2.9, bicarbonate 19, serum creatinine 1.01.  UA showed 11-20 WBC.  CT of the brain was negative for any acute findings.  CT cervical spine was negative for acute fracture.  Left ankle x-ray was negative for any fracture.  Patient was  admitted for further evaluation and treatment of her frequent falls and gait instability.   Assessment/Plan: Gait instability, frequent falls/generalized weakness -Repeat serum B12 -She had low B12 1 03/02/2023 -Folic acid -TSH -CPK -PT evaluation -UA 11-20 WBC -Check orthostatic vital signs -MR brain  Dementia without behavioral disturbance -Continue donepezil and memantine -Continue quetiapine at bedtime -Continue sertraline  Essential hypertension -She is not on any agents as outpatient  Hypophosphatemia/hypokalemia -Replete  Leukemoid reaction -May certainly be secondary to UTI as patient has some mild pyuria -Continue ceftriaxone pending urine culture -Suspected degree of stress demargination           Family Communication:   sister updated 7/25  Consultants:  none  Code Status:  FULL   DVT Prophylaxis: [place] Lovenox   Procedures: As Listed in Progress Note Above  Antibiotics: None       Subjective:  Limited review of systems secondary to her dementia.  The patient denies any current headache, chest pain, shortness breath, abdominal pain.  Remainder unobtainable secondary to dementia. Objective: Vitals:   05/17/23 2247 05/17/23 2248 05/17/23 2346 05/18/23 0404  BP:   (!) 152/68 (!) 154/69  Pulse: 65  90 66  Resp:  15 18 18   Temp:  98 F (36.7 C) 98.4 F (36.9 C)   TempSrc:  Oral    SpO2: 100%  95% 100%  Weight:   54.8 kg   Height:   5\' 2"  (1.575 m)     Intake/Output Summary (Last 24 hours) at 05/18/2023  0708 Last data filed at 05/18/2023 0500 Gross per 24 hour  Intake 120 ml  Output --  Net 120 ml   Weight change:  Exam:  General:  Pt is alert, follows commands appropriately, not in acute distress HEENT: No icterus, No thrush, No neck mass, [place]/AT Cardiovascular: RRR, S1/S2, no rubs, no gallops Respiratory: CTA bilaterally, no wheezing, no crackles, no rhonchi Abdomen: Soft/+BS, non tender, non distended, no  guarding Extremities: No edema, No lymphangitis, No petechiae, No rashes, no synovitis Neuro:  CN II-XII intact, strength 4/5 in RUE, RLE, strength 4/5 LUE, LLE; sensation intact bilateral; no dysmetria; babinski equivocal    Data Reviewed: I have personally reviewed following labs and imaging studies Basic Metabolic Panel: Recent Labs  Lab 05/17/23 1325 05/17/23 1947 05/18/23 0359  NA 138 137 139  K 3.2* 2.9* 3.3*  CL 110 107 112*  CO2 18* 19* 20*  GLUCOSE 101* 117* 86  BUN 18 19 14   CREATININE 0.81 1.01* 0.81  CALCIUM 8.9 9.0 8.5*  MG  --   --  2.1  PHOS  --   --  1.7*   Liver Function Tests: Recent Labs  Lab 05/18/23 0359  AST 23  ALT 12  ALKPHOS 62  BILITOT 1.0  PROT 6.8  ALBUMIN 3.6   No results for input(s): "LIPASE", "AMYLASE" in the last 168 hours. No results for input(s): "AMMONIA" in the last 168 hours. Coagulation Profile: No results for input(s): "INR", "PROTIME" in the last 168 hours. CBC: Recent Labs  Lab 05/17/23 1325 05/17/23 1947 05/18/23 0359  WBC 9.6 13.2* 10.7*  NEUTROABS  --  11.0*  --   HGB 12.9 13.3 11.7*  HCT 36.9 39.0 35.1*  MCV 90.7 92.4 94.4  PLT 267 284 244   Cardiac Enzymes: No results for input(s): "CKTOTAL", "CKMB", "CKMBINDEX", "TROPONINI" in the last 168 hours. BNP: Invalid input(s): "POCBNP" CBG: No results for input(s): "GLUCAP" in the last 168 hours. HbA1C: No results for input(s): "HGBA1C" in the last 72 hours. Urine analysis:    Component Value Date/Time   COLORURINE YELLOW 05/17/2023 2111   APPEARANCEUR HAZY (A) 05/17/2023 2111   LABSPEC 1.019 05/17/2023 2111   PHURINE 5.0 05/17/2023 2111   GLUCOSEU NEGATIVE 05/17/2023 2111   GLUCOSEU NEGATIVE 06/12/2019 1109   HGBUR SMALL (A) 05/17/2023 2111   BILIRUBINUR NEGATIVE 05/17/2023 2111   KETONESUR 5 (A) 05/17/2023 2111   PROTEINUR 30 (A) 05/17/2023 2111   UROBILINOGEN 0.2 06/12/2019 1109   NITRITE NEGATIVE 05/17/2023 2111   LEUKOCYTESUR NEGATIVE 05/17/2023  2111   Sepsis Labs: @LABRCNTIP (procalcitonin:4,lacticidven:4) )No results found for this or any previous visit (from the past 240 hour(s)).   Scheduled Meds:  donepezil  10 mg Oral QHS   enoxaparin (LOVENOX) injection  40 mg Subcutaneous Q24H   feeding supplement  237 mL Oral BID BM   memantine  10 mg Oral BID   Continuous Infusions:  cefTRIAXone (ROCEPHIN)  IV      Procedures/Studies: CT Cervical Spine Wo Contrast  Result Date: 05/17/2023 CLINICAL DATA:  Recent fall with neck pain, initial encounter EXAM: CT CERVICAL SPINE WITHOUT CONTRAST TECHNIQUE: Multidetector CT imaging of the cervical spine was performed without intravenous contrast. Multiplanar CT image reconstructions were also generated. RADIATION DOSE REDUCTION: This exam was performed according to the departmental dose-optimization program which includes automated exposure control, adjustment of the mA and/or kV according to patient size and/or use of iterative reconstruction technique. COMPARISON:  01/24/2023 FINDINGS: Alignment: Within normal limits. Skull base and vertebrae:  7 cervical segments are well visualized. Vertebral body height is well maintained. Mild osteophytic changes are noted. Mild facet hypertrophic changes are noted as well. No acute fracture or acute facet abnormality is noted. Posterior fusion defect at C1 is seen. Soft tissues and spinal canal: Surrounding soft tissue structures demonstrate scattered calcifications within the thyroid. No discrete nodule is noted. No other soft tissue changes are noted. Upper chest: Visualized lung apices are within normal limits. Other: None IMPRESSION: Degenerative changes of the cervical spine without acute abnormality. Electronically Signed   By: Alcide Clever M.D.   On: 05/17/2023 20:28   CT Head Wo Contrast  Result Date: 05/17/2023 CLINICAL DATA:  Fall, head trauma. EXAM: CT HEAD WITHOUT CONTRAST TECHNIQUE: Contiguous axial images were obtained from the base of the skull  through the vertex without intravenous contrast. RADIATION DOSE REDUCTION: This exam was performed according to the departmental dose-optimization program which includes automated exposure control, adjustment of the mA and/or kV according to patient size and/or use of iterative reconstruction technique. COMPARISON:  Head CT 01/24/2023 FINDINGS: Brain: No evidence of acute infarction, hemorrhage, hydrocephalus, extra-axial collection or mass lesion/mass effect. There is mild diffuse atrophy. There is mild periventricular white matter hypodensity. Vascular: No hyperdense vessel or unexpected calcification. Skull: Normal. Negative for fracture or focal lesion. Sinuses/Orbits: No acute finding. Other: None. IMPRESSION: 1. No acute intracranial process. 2. Mild diffuse atrophy and mild periventricular white matter hypodensity, likely chronic microvascular disease. Electronically Signed   By: Darliss Cheney M.D.   On: 05/17/2023 20:23   DG Ankle Complete Left  Result Date: 05/17/2023 CLINICAL DATA:  Fall, left ankle pain EXAM: LEFT ANKLE COMPLETE - 3+ VIEW COMPARISON:  None Available. FINDINGS: There is no evidence of fracture, dislocation, or joint effusion. Mild degenerative spurring within the medial gutter of the ankle. Soft tissues are unremarkable. IMPRESSION: Negative. Electronically Signed   By: Duanne Guess D.O.   On: 05/17/2023 14:38   DG Hip Unilat W or Wo Pelvis 2-3 Views Left  Result Date: 05/17/2023 CLINICAL DATA:  Fall, left hip pain EXAM: DG HIP (WITH OR WITHOUT PELVIS) 2-3V LEFT COMPARISON:  01/09/2023 FINDINGS: There is no evidence of hip fracture or dislocation. There is no evidence of significant arthropathy or other focal bone abnormality. IMPRESSION: Negative. Electronically Signed   By: Duanne Guess D.O.   On: 05/17/2023 14:37    Catarina Hartshorn, DO  [facility]  If 7PM-7AM, please contact night-coverage [email or web address] Password [email or web address] 05/18/2023, 7:08 AM   LOS: 0 days

## 2023-05-18 NOTE — Evaluation (Signed)
Physical Therapy Evaluation Patient Details Name: [name] MRN: [id number] DOB: 1950-12-30 Today's Date: 05/18/2023  History of Present Illness  [name] is a 71 y.o. female with medical history significant of hypertension, dementia who presents to the emergency department from home via EMS due to fall at home.  Patient was unable to provide history possibly due to underlying dementia, history was obtained from sister at bedside.  Per report, patient lives with her sister and at baseline, at baseline, patient was able to speak, though speech has been declining.  She reported to have slid out of bed this morning and was unable to get up, so EMS was activated patient was taken to the ED get upd get up. EMS was activated a patient was taken to the ED for further evaluation and management.  Apparently she has been complaining of some pain in her ankle.  X-rays done showed no acute injuries and patient was discharged home.  Patient had another fall at home while standing up from a chair and hit the back of her head on the ground without loss of consciousness.  Patient's sister states that she has been more weak recently within the last few days.  There was no report or chest pain, shortness of breath, fever, chills, abdominal pain.   Clinical Impression  Pt tolerated treatment well. Activity limited due to fatigue. No AD was utilized. Patient will benefit from continued skilled physical therapy in hospital and recommended venue below to increase strength, balance, endurance for safe ADLs and gait.       Assistance Recommended at Discharge Set up Supervision/Assistance  If plan is discharge home, recommend the following:  Can travel by private vehicle  A little help with walking and/or transfers;A little help with bathing/dressing/bathroom;Assistance with cooking/housework;Assist for transportation        Equipment Recommendations    Recommendations for Other Services        Functional Status Assessment Patient has had a recent decline in their functional status and demonstrates the ability to make significant improvements in function in a reasonable and predictable amount of time.     Precautions / Restrictions Precautions Precautions: Fall Restrictions Weight Bearing Restrictions: No      Mobility  Bed Mobility Overal bed mobility: Modified Independent Bed Mobility: Supine to Sit     Supine to sit: Supervision     General bed mobility comments: Good transfer from supine to sit at EOB    Transfers Overall transfer level: Needs assistance   Transfers: Sit to/from Stand, Bed to chair/wheelchair/BSC Sit to Stand: Min guard   Step pivot transfers: Min guard       General transfer comment: Mildly unsteady.    Ambulation/Gait Ambulation/Gait assistance: Min guard Gait Distance (Feet): 75 Feet Assistive device: None Gait Pattern/deviations: Step-through pattern, Decreased step length - right, Decreased step length - left, Decreased stride length Gait velocity: slow     General Gait Details: Slowed movement but not labored  Stairs            Wheelchair Mobility     Tilt Bed    Modified Rankin (Stroke Patients Only)       Balance Overall balance assessment: Mild deficits observed, not formally tested                                           Pertinent Vitals/Pain Pain Assessment  Pain Assessment: No/denies pain    Home Living Family/patient expects to be discharged to:: Private residence Living Arrangements: Other relatives Available Help at Discharge: Family;Available 24 hours/day Type of Home: House Home Access: Stairs to enter Entrance Stairs-Rails: Right;Left;Can reach both Entrance Stairs-Number of Steps: 6   Home Layout: One level Home Equipment: Agricultural consultant (2 wheels)      Prior Function Prior Level of Function : Needs assist;History of Falls (last six months)  Cognitive Assist :  ADLs (cognitive)     Physical Assist : ADLs (physical)   ADLs (physical): IADLs;Grooming;Bathing;Dressing;Toileting Mobility Comments: [place] ambulation with RW ADLs Comments: Sister reports she assists with all ADL's other than feeding. IADL assist.     Hand Dominance   Dominant Hand: Right    Extremity/Trunk Assessment   Upper Extremity Assessment Upper Extremity Assessment: Generalized weakness;LUE deficits/detail LUE Deficits / Details: 3-/5 L shoulder MMT; generally weak otherwise. Sister thinks pt may have had a strok many years ago. LUE Coordination: WNL    Lower Extremity Assessment Lower Extremity Assessment: Defer to PT evaluation    Cervical / Trunk Assessment Cervical / Trunk Assessment: Normal  Communication   Communication: No difficulties  Cognition Arousal/Alertness: Awake/alert Behavior During Therapy: WFL for tasks assessed/performed Overall Cognitive Status: History of cognitive impairments - at baseline                                          General Comments      Exercises     Assessment/Plan    PT Assessment Patient needs continued PT services  PT Problem List Decreased strength;Decreased mobility;Decreased coordination;Decreased activity tolerance;Decreased balance       PT Treatment Interventions DME instruction;Therapeutic exercise;Gait training;Balance training;Functional mobility training;Therapeutic activities    PT Goals (Current goals can be found in the Care Plan section)  Acute Rehab PT Goals Patient Stated Goal: Return home with assist of family PT Goal Formulation: With patient Time For Goal Achievement: 05/25/23 Potential to Achieve Goals: Good    Frequency Min 3X/week     Co-evaluation PT/OT/SLP Co-Evaluation/Treatment: Yes Reason for Co-Treatment: To address functional/ADL transfers PT goals addressed during session: Mobility/safety with mobility;Balance;Proper use of DME;Strengthening/ROM OT  goals addressed during session: ADL's and self-care       AM-PAC PT "6 Clicks" Mobility  Outcome Measure Help needed turning from your back to your side while in a flat bed without using bedrails?: A Little Help needed moving from lying on your back to sitting on the side of a flat bed without using bedrails?: A Little Help needed moving to and from a bed to a chair (including a wheelchair)?: A Little Help needed standing up from a chair using your arms (e.g., wheelchair or bedside chair)?: A Little Help needed to walk in hospital room?: A Little Help needed climbing 3-5 steps with a railing? : A Little 6 Click Score: 18    End of Session Equipment Utilized During Treatment: Gait belt Activity Tolerance: Patient tolerated treatment well;No increased pain;Patient limited by fatigue Patient left: in chair;with call bell/phone within reach;with family/visitor present Nurse Communication: Mobility status PT Visit Diagnosis: Unsteadiness on feet (R26.81);Other abnormalities of gait and mobility (R26.89);History of falling (Z91.81);Muscle weakness (generalized) (M62.81)    Time: 4132-4401 PT Time Calculation (min) (ACUTE ONLY): 11 min   Charges:   PT Evaluation $PT Eval Low Complexity: 1 Low PT Treatments $Therapeutic Activity: 8-22  mins PT General Charges $$ ACUTE PT VISIT: 1 Visit        Jamiria Langill SPT [place], DPT Program

## 2023-05-18 NOTE — Progress Notes (Signed)
Initial Nutrition Assessment  DOCUMENTATION CODES:   Not applicable  INTERVENTION:  Ensure Enlive po BID, each supplement provides 350 kcal and 20 grams of protein. Multivitamin w/ minerals daily Liberalize pt diet to regular due to increased needs and prevent further weight loss   NUTRITION DIAGNOSIS:   Increased nutrient needs related to acute illness as evidenced by estimated needs.  GOAL:   Patient will meet greater than or equal to 90% of their needs  MONITOR:   PO intake, Supplement acceptance, Labs, I & O's  REASON FOR ASSESSMENT:   Malnutrition Screening Tool    ASSESSMENT:   72 y.o. female present to the ED after a fall at home. PMH includes dementia, HTN,and IBS. Pt admitted with gait instability and generalized weakness.   Met with pt and pt sister in room. Pt reports that her appetite is pretty well at home. Denies any nausea or vomiting. Breakfast tray in front of pt, ate fairly well. Pt unsure of weight loss, sister shares that pt has had about 6# gradual weight loss. Unsure of time frame. Per weight history, pt with a 5% weight loss within 2 months, this is not clinically significant for time frame.   Discussed oral nutrition supplements, pt occasionally drinks at home. Pt agreeable to Ensure during admission and likes the chocolate flavor.   Medications reviewed and include: Phosphorus, IV antibiotics  Labs reviewed: Sodium 139, potassium 3.3, BUN 14, Creatinine 0.81, Phosphorus 1.7, Magnesium 2.1   NUTRITION - FOCUSED PHYSICAL EXAM:  Flowsheet Row Most Recent Value  Orbital Region No depletion  Upper Arm Region No depletion  Thoracic and Lumbar Region Unable to assess  Buccal Region No depletion  Temple Region Mild depletion  Clavicle Bone Region Mild depletion  Clavicle and Acromion Bone Region No depletion  Scapular Bone Region No depletion  Dorsal Hand Unable to assess  Patellar Region Unable to assess  Anterior Thigh Region Unable to assess   Posterior Calf Region No depletion  Edema (RD Assessment) None    Diet Order:   Diet Order             Diet regular Room service appropriate? Yes; Fluid consistency: Thin  Diet effective now                   EDUCATION NEEDS:   No education needs have been identified at this time  Skin:  Skin Assessment: Reviewed RN Assessment  Last BM:  7/24  Height:   Ht Readings from Last 1 Encounters:  05/17/23 5\' 2"  (1.575 m)    Weight:   Wt Readings from Last 1 Encounters:  05/17/23 54.8 kg    Ideal Body Weight:  50 kg  BMI:  Body mass index is 22.1 kg/m.  Estimated Nutritional Needs:   Kcal:  1600-1800  Protein:  80-100 grams  Fluid:  >/= 1.6 L    Kirby Crigler RD, LDN Clinical Dietitian See [facility] for contact information.

## 2023-05-18 NOTE — Plan of Care (Signed)
  Problem: Acute Rehab OT Goals (only OT should resolve) Goal: Pt. Will Perform Grooming Flowsheets (Taken 05/18/2023 0957) Pt Will Perform Grooming:  with modified independence  sitting Goal: Pt. Will Perform Upper Body Dressing Flowsheets (Taken 05/18/2023 0957) Pt Will Perform Upper Body Dressing:  with modified independence  sitting Goal: Pt. Will Perform Lower Body Dressing Flowsheets (Taken 05/18/2023 0957) Pt Will Perform Lower Body Dressing:  sitting/lateral leans  with supervision Goal: Pt. Will Transfer To Toilet Flowsheets (Taken 05/18/2023 0957) Pt Will Transfer to Toilet:  with supervision  ambulating Goal: Pt/Caregiver Will Perform Home Exercise Program Flowsheets (Taken 05/18/2023 0957) Pt/caregiver will Perform Home Exercise Program:  Increased ROM  Increased strength  Right Upper extremity  Left upper extremity  With Supervision  Anothony Bursch OT, MOT

## 2023-05-18 NOTE — Evaluation (Signed)
Occupational Therapy Evaluation Patient Details Name: [name] MRN: [id number] DOB: [birth year] Today's Date: 05/18/2023   History of Present Illness [name] is a 72 y.o. female with medical history significant of hypertension, dementia who presents to the emergency department from home via EMS due to fall at home.  Patient was unable to provide history possibly due to underlying dementia, history was obtained from sister at bedside.  Per report, patient lives with her sister and at baseline, at baseline, patient was able to speak, though speech has been declining.  She reported to have slid out of bed this morning and was unable to get up, so EMS was activated patient was taken to the ED get upd get up. EMS was activated a patient was taken to the ED for further evaluation and management.  Apparently she has been complaining of some pain in her ankle.  X-rays done showed no acute injuries and patient was discharged home.  Patient had another fall at home while standing up from a chair and hit the back of her head on the ground without loss of consciousness.  Patient's sister states that she has been more weak recently within the last few days.  There was no report or chest pain, shortness of breath, fever, chills, abdominal pain. (per DO)   Clinical Impression   Pt agreeable to OT and PT co-evaluation. Pt assisted by sister at baseline due to dementia. Pt is assisted for ADL's and is a household ambulator. Pt does have weakness in L UE with sister reporting they think she may have had a stroke many years ago. Pt is mildly unsteady without RW but needing only min G assist for transfers. Family informed to talk to social worker about long term care or more assist options to give family a break at home. Home health safety assessment would also be beneficial based on history of falls. Pt will benefit from continued OT in the hospital and recommended venue below to increase strength, balance,  and endurance for safe ADL's.        Recommendations for follow up therapy are one component of a multi-disciplinary discharge planning process, led by the attending physician.  Recommendations may be updated based on patient status, additional functional criteria and insurance authorization.   Assistance Recommended at Discharge Frequent or constant Supervision/Assistance  Patient can return home with the following A little help with walking and/or transfers;A lot of help with bathing/dressing/bathroom;Assistance with cooking/housework;Assist for transportation;Help with stairs or ramp for entrance;Direct supervision/assist for medications management    Functional Status Assessment  Patient has had a recent decline in their functional status and demonstrates the ability to make significant improvements in function in a reasonable and predictable amount of time.  Equipment Recommendations  None recommended by OT           Precautions / Restrictions Precautions Precautions: Fall Restrictions Weight Bearing Restrictions: No      Mobility Bed Mobility Overal bed mobility: Needs Assistance Bed Mobility: Supine to Sit     Supine to sit: Supervision          Transfers Overall transfer level: Needs assistance   Transfers: Sit to/from Stand, Bed to chair/wheelchair/BSC Sit to Stand: Min guard     Step pivot transfers: Min guard     General transfer comment: Mildly unsteady.      Balance Overall balance assessment: Mild deficits observed, not formally tested  ADL either performed or assessed with clinical judgement   ADL Overall ADL's : Needs assistance/impaired     Grooming: Minimal assistance;Sitting   Upper Body Bathing: Minimal assistance;Sitting   Lower Body Bathing: Moderate assistance;Sitting/lateral leans   Upper Body Dressing : Minimal assistance;Sitting   Lower Body Dressing: Moderate  assistance;Sitting/lateral leans Lower Body Dressing Details (indicate cue type and reason): Pt able to push her shoes on once started in place by this therapist while seated at the EOB. Toilet Transfer: Min guard;Stand-pivot;Ambulation Toilet Transfer Details (indicate cue type and reason): Simulated via bed to chair transfer and ambulation in hall without AD. Toileting- Clothing Manipulation and Hygiene: Moderate assistance;Sitting/lateral lean       Functional mobility during ADLs: Min guard General ADL Comments: Pt ambulated >40 feet in room and hall without AD.     Vision Baseline Vision/History: 0 No visual deficits Ability to See in Adequate Light: 0 Adequate Patient Visual Report: No change from baseline Vision Assessment?: No apparent visual deficits                Pertinent Vitals/Pain Pain Assessment Pain Assessment: No/denies pain     Hand Dominance Right   Extremity/Trunk Assessment Upper Extremity Assessment Upper Extremity Assessment: Generalized weakness;LUE deficits/detail LUE Deficits / Details: 3-/5 L shoulder MMT; generally weak otherwise. Sister thinks pt may have had a strok many years ago. LUE Coordination: WNL   Lower Extremity Assessment Lower Extremity Assessment: Defer to PT evaluation   Cervical / Trunk Assessment Cervical / Trunk Assessment: Normal   Communication Communication Communication: No difficulties   Cognition Arousal/Alertness: Awake/alert Behavior During Therapy: WFL for tasks assessed/performed Overall Cognitive Status: History of cognitive impairments - at baseline                                                        Home Living Family/patient expects to be discharged to:: Private residence Living Arrangements: Other relatives Available Help at Discharge: Family;Available 24 hours/day Type of Home: House Home Access: Stairs to enter [employer] of Steps: 6 Entrance Stairs-Rails:  Right;Left;Can reach both Home Layout: One level     Bathroom Shower/Tub: Sponge bathes at baseline   [employer]: Standard (Has grab bars over toilet.) Bathroom Accessibility: Yes How Accessible: Accessible via wheelchair;Accessible via walker Home Equipment: Rolling Walker (2 wheels)          Prior Functioning/Environment Prior Level of Function : Needs assist;History of Falls (last six months)  Cognitive Assist : ADLs (cognitive)     Physical Assist : ADLs (physical)   ADLs (physical): IADLs;Grooming;Bathing;Dressing;Toileting Mobility Comments: [place] ambulation with RW ADLs Comments: Sister reports she assists with all ADL's other than feeding. IADL assist.        OT Problem List: Decreased strength;Decreased activity tolerance;Impaired balance (sitting and/or standing);Decreased cognition      OT Treatment/Interventions: Self-care/ADL training;Therapeutic exercise;Cognitive remediation/compensation;Therapeutic activities;Patient/family education;DME and/or AE instruction    OT Goals(Current goals can be found in the care plan section) Acute Rehab OT Goals Patient Stated Goal: Interested in home safety assessment. OT Goal Formulation: With family Time For Goal Achievement: 06/01/23 Potential to Achieve Goals: Good  OT Frequency: Min 1X/week    Co-evaluation PT/OT/SLP Co-Evaluation/Treatment: Yes Reason for Co-Treatment: To address functional/ADL transfers   OT goals addressed during session: ADL's and self-care  AM-PAC OT "6 Clicks" Daily Activity     Outcome Measure Help from another person eating meals?: None Help from another person taking care of personal grooming?: A Little Help from another person toileting, which includes using toliet, bedpan, or urinal?: A Lot Help from another person bathing (including washing, rinsing, drying)?: A Lot Help from another person to put on and taking off regular upper body clothing?: A Little Help from  another person to put on and taking off regular lower body clothing?: A Lot 6 Click Score: 16   End of Session Equipment Utilized During Treatment: Gait belt  Activity Tolerance: Patient tolerated treatment well Patient left: in chair;with call bell/phone within reach;with family/visitor present  OT Visit Diagnosis: Unsteadiness on feet (R26.81);Other abnormalities of gait and mobility (R26.89);Muscle weakness (generalized) (M62.81)                Time: 1308-6578 OT Time Calculation (min): 17 min Charges:  OT General Charges $OT Visit: 1 Visit OT Evaluation $OT Eval Low Complexity: 1 Low  Yilin Weedon OT, MOT  Danie Chandler 05/18/2023, 9:54 AM

## 2023-05-18 NOTE — Plan of Care (Signed)
  Problem: Acute Rehab PT Goals(only PT should resolve) Goal: Pt Will Go Supine/Side To Sit Outcome: Progressing Flowsheets (Taken 05/18/2023 1209) Pt will go Supine/Side to Sit: with min guard assist Goal: Patient Will Transfer Sit To/From Stand Outcome: Progressing Flowsheets (Taken 05/18/2023 1209) Patient will transfer sit to/from stand: with min guard assist Goal: Pt Will Transfer Bed To Chair/Chair To Bed Outcome: Progressing Flowsheets (Taken 05/18/2023 1209) Pt will Transfer Bed to Chair/Chair to Bed: min guard assist Goal: Pt Will Ambulate Outcome: Progressing Flowsheets (Taken 05/18/2023 1209) Pt will Ambulate: 100 feet   Danaye Sobh SPT [place], DPT Program

## 2023-05-18 NOTE — Hospital Course (Signed)
72 year old female with a history of dementia, hypertension, hyperlipidemia, B12 deficiency presenting with unsteady gait and falling.  The patient initially slid out of bed and was not able to get up.  EMS was activated and the patient was brought to the emergency department.  An x-ray of her left ankle was performed and was negative.  Labs were otherwise reassuring except for potassium of 2.9.  The patient was subsequently discharged home.  After discharge home, the patient had a second fall trying to get up from the chair.  Apparently she hit her head on the ground without loss of consciousness.  The patient was brought back to the emergency department for further evaluation and treatment a second time. The patient is a poor historian secondary to her dementia.  History is supplemented by the patient's sister with whom the patient lives.  Sister states that the patient has had a cognitive decline over the last 6 months.  Functionally, the patient is able to ambulate without assistance.  She has not had a history of falls prior to the current events.  There is been no recent fevers, chills, chest pain, shortness breath, nausea, vomiting, diarrhea, abdominal pain.  Oral intake has been good according to the sister.  The patient does require assistance with her ADLs, but the patient is able to feed herself.  Sister states that the patient's mental status is near baseline presently. In the ED, the patient was afebrile hemodynamically stable with oxygen saturation 90% room air.  WBC 13.2, hemoglobin 13.3, platelets 284,000.  Sodium 137, potassium 2.9, bicarbonate 19, serum creatinine 1.01.  UA showed 11-20 WBC.  CT of the brain was negative for any acute findings.  CT cervical spine was negative for acute fracture.  Left ankle x-ray was negative for any fracture.  Patient was admitted for further evaluation and treatment of her frequent falls and gait instability.

## 2023-05-18 NOTE — TOC Initial Note (Addendum)
Transition of Care [facility]) - Initial/Assessment Note    Patient Details  Name: [name] MRN: [id number] Date of Birth: 09/21/1951  [facility] CM/SW Contact:    Leitha Bleak, RN Phone Number: 05/18/2023, 1:15 PM  Clinical Narrative:    Patient admitted after a fall. Patient lives with her sister, [name]. CM at the bedside. PT recommended HHPT/OT. Patient is at [facility], she has dementia. [name] has concerns about her needs increasing. CM gave her a list of ALF/Family care homes and explained respite care. She is not ready for LTC, but realizes they need to have these discussions. Patient niece handles her finances. She will check on medicaid for the future.  She is agreeable to [facility] for HHPT/OT/ AIDE and SW to continue discussion and assistance with ALF. MD aware to order. Clifton Custard with [facility] accepted.    Expected Discharge Plan: Home w Home Health Services Barriers to Discharge: Continued Medical Work up   Patient Goals and CMS Choice Patient states their goals for this hospitalization and ongoing recovery are:: agreeable to go home. CMS Medicare.gov Compare Post Acute Care list provided to:: Patient Represenative (must comment) Choice offered to / list presented to : Sibling      Expected Discharge Plan and Services       Living arrangements for the past 2 months: Single Family Home                    HH Arranged: PT, OT, Social Work [facility] Agency: [facility] Date [facility] Agency Contacted: 05/18/23 Time HH Agency Contacted: 1314 Representative spoke with at [facility] Agency: Clifton Custard  Prior Living Arrangements/Services Living arrangements for the past 2 months: Single Family Home Lives with:: Siblings Patient language and need for interpreter reviewed:: Yes        Need for Family Participation in Patient Care: Yes (Comment) Care giver support system in place?: Yes (comment)   Criminal Activity/Legal Involvement Pertinent to Current  Situation/Hospitalization: No - Comment as needed  Activities of Daily Living Home Assistive Devices/Equipment: None ADL Screening (condition at time of admission) Patient's cognitive ability adequate to safely complete daily activities?: No Is the patient deaf or have difficulty hearing?: No Does the patient have difficulty seeing, even when wearing glasses/contacts?: No Does the patient have difficulty concentrating, remembering, or making decisions?: Yes Patient able to express need for assistance with ADLs?: No Does the patient have difficulty dressing or bathing?: Yes Independently performs ADLs?: No Communication: Independent Dressing (OT): Dependent Is this a change from baseline?: Pre-admission baseline Grooming: Needs assistance Is this a change from baseline?: Pre-admission baseline Feeding: Independent Bathing: Dependent Is this a change from baseline?: Pre-admission baseline Toileting: Dependent Is this a change from baseline?: Pre-admission baseline In/Out Bed: Needs assistance Is this a change from baseline?: Pre-admission baseline Walks in Home: Needs assistance Is this a change from baseline?: Pre-admission baseline Does the patient have difficulty walking or climbing stairs?: Yes Weakness of Legs: Both Weakness of Arms/Hands: Left  Permission Sought/Granted      Share Information with NAME: [name]     Permission granted to share info w Relationship: Sister     Emotional Assessment     Affect (typically observed): Flat Orientation: : Oriented to Self Alcohol / Substance Use: Not Applicable Psych Involvement: No (comment)  Admission diagnosis:  Generalized weakness [R53.1] Fall at home [W19.XXXA, Y92.009] Urinary tract infection without hematuria, site unspecified [N39.0] Patient Active Problem List   Diagnosis Date Noted   UTI (urinary  tract infection) 05/18/2023   Hypokalemia 05/18/2023   Leukocytosis 05/18/2023   Dementia without behavioral  disturbance (HCC) 05/18/2023   Gait instability 05/18/2023   Generalized weakness 05/18/2023   Fall at home 05/17/2023   Late onset Alzheimer's disease with behavioral disturbance (HCC) 08/05/2021   Memory problem 06/12/2019   Alopecia areata 05/30/2014   Tendinopathy of left biceps tendon 10/04/2013   Trigger finger, acquired 10/04/2013   Left shoulder pain 09/03/2013   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   Onychomycosis of toenail 01/22/2013   IBS (irritable bowel syndrome) 09/09/2011   ABDOMINAL PAIN, LOWER 02/02/2009   Essential hypertension 09/26/2008   B12 deficiency 11/13/2007   Dyslipidemia 11/13/2007   LOW BACK PAIN 11/13/2007   Chronic depression 07/14/2007   OSTEOARTHRITIS 07/14/2007   PCP:  Karie Georges, MD Pharmacy:   [facility] [phone] - [place], [place] - [address] AT [facility] [address] [place] [address] Phone: [phone] Fax: [phone]   Social Determinants of Health (SDOH) Social History: SDOH Screenings   Food Insecurity: No Food Insecurity (05/17/2023)  Housing: Low Risk  (05/17/2023)  Transportation Needs: No Transportation Needs (05/17/2023)  Utilities: Not At Risk (05/17/2023)  Alcohol Screen: Low Risk  (09/20/2022)  Depression (PHQ2-9): Low Risk  (01/18/2023)  Financial Resource Strain: Low Risk  (09/20/2022)  Physical Activity: Inactive (09/20/2022)  Social Connections: Moderately Integrated (09/20/2022)  Stress: No Stress Concern Present (09/20/2022)  Tobacco Use: Low Risk  (05/17/2023)   SDOH Interventions:   Readmission Risk Interventions     No data to display

## 2023-05-19 DIAGNOSIS — R531 Weakness: Secondary | ICD-10-CM | POA: Diagnosis not present

## 2023-05-19 DIAGNOSIS — N390 Urinary tract infection, site not specified: Secondary | ICD-10-CM | POA: Diagnosis not present

## 2023-05-19 DIAGNOSIS — E876 Hypokalemia: Secondary | ICD-10-CM | POA: Diagnosis not present

## 2023-05-19 DIAGNOSIS — W19XXXD Unspecified fall, subsequent encounter: Secondary | ICD-10-CM | POA: Diagnosis not present

## 2023-05-19 LAB — BASIC METABOLIC PANEL: Potassium: 4.1 mmol/L (ref 3.5–5.1)

## 2023-05-19 MED ORDER — CYANOCOBALAMIN 500 MCG PO TABS: 500.0000 ug | ORAL_TABLET | Freq: Every day | ORAL | Status: DC

## 2023-05-19 NOTE — Discharge Summary (Addendum)
Physician Discharge Summary   Patient: [name] MRN: [id number] DOB: 01-25-1951  Admit date:     05/17/2023  Discharge date: 05/19/23  Discharge Physician: Onalee Hua Ronnika Collett   PCP: Karie Georges, MD   Recommendations at discharge:   Please follow up with primary care provider within 1-2 weeks  Please repeat BMP and CBC in one week    Hospital Course: 72 year old female with a history of dementia, hypertension, hyperlipidemia, B12 deficiency presenting with unsteady gait and falling.  The patient initially slid out of bed and was not able to get up.  EMS was activated and the patient was brought to the emergency department.  An x-ray of her left ankle was performed and was negative.  Labs were otherwise reassuring except for potassium of 2.9.  The patient was subsequently discharged home.  After discharge home, the patient had a second fall trying to get up from the chair.  Apparently she hit her head on the ground without loss of consciousness.  The patient was brought back to the emergency department for further evaluation and treatment a second time. The patient is a poor historian secondary to her dementia.  History is supplemented by the patient's sister with whom the patient lives.  Sister states that the patient has had a cognitive decline over the last 6 months.  Functionally, the patient is able to ambulate without assistance.  She has not had a history of falls prior to the current events.  There is been no recent fevers, chills, chest pain, shortness breath, nausea, vomiting, diarrhea, abdominal pain.  Oral intake has been good according to the sister.  The patient does require assistance with her ADLs, but the patient is able to feed herself.  Sister states that the patient's mental status is near baseline presently. In the ED, the patient was afebrile hemodynamically stable with oxygen saturation 90% room air.  WBC 13.2, hemoglobin 13.3, platelets 284,000.  Sodium 137, potassium  2.9, bicarbonate 19, serum creatinine 1.01.  UA showed 11-20 WBC.  CT of the brain was negative for any acute findings.  CT cervical spine was negative for acute fracture.  Left ankle x-ray was negative for any fracture.  Patient was admitted for further evaluation and treatment of her frequent falls and gait instability.  Assessment and Plan: Gait instability, frequent falls/generalized weakness -Repeat serum B12>>197 -She had low B12 on 03/02/2023 -Folic acid--10.2 -TSH--1.277 -CPK--388 -UA 11-20 WBC -Check orthostatic vital signs--neg -MR brain--neg for acute findings -PT eval>>HHPT -with marginal B12>>IM B12 x 1 given and started on po supplementation   Dementia without behavioral disturbance -Continue donepezil and memantine -Continue quetiapine at bedtime -Continue sertraline -TOC and social work Technical brewer given resources for ALF and respite care as pt did not qualify for SNF -HHPT and SW were set up at time of dc  Pyuria -urine culture still pending at time of d/c -received 3 doses ceftriaxone during hospitalization   Essential hypertension -She is not on any agents as outpatient -BP remains acceptable with SBP 150s,    Hypophosphatemia/hypokalemia -Repleted   Leukemoid reaction -May certainly be secondary to UTI as patient has some mild pyuria -Continue ceftriaxone pending urine culture -Suspected degree of stress demargination -improved -remains afebrile and hemodynamically stable      Consultants: none Procedures performed: none  Disposition: Home Diet recommendation:  Regular diet DISCHARGE MEDICATION: Allergies as of 05/19/2023       Reactions   Erythromycin Base Nausea Only   Other Reaction(s): GI Intolerance  Other Nausea And Vomiting, Other (See Comments)   Mycins-dizziness        Medication List     STOP taking these medications    sertraline 50 MG tablet Commonly known as: ZOLOFT       TAKE these medications     cyanocobalamin 1000 MCG tablet Commonly known as: VITAMIN B12 Take 1 tablet (1,000 mcg total) by mouth daily. For 30 days then reduce to 1/2 tablet daily for 30 days. What changed: Another medication with the same name was added. Make sure you understand how and when to take each.   cyanocobalamin 500 MCG tablet Commonly known as: VITAMIN B12 Take 1 tablet (500 mcg total) by mouth daily. What changed: You were already taking a medication with the same name, and this prescription was added. Make sure you understand how and when to take each.   donepezil 10 MG tablet Commonly known as: ARICEPT TAKE 1 TABLET BY MOUTH EVERYDAY AT BEDTIME What changed: See the new instructions.   memantine 10 MG tablet Commonly known as: NAMENDA TAKE 1 TABLET BY MOUTH TWICE A DAY   ondansetron 4 MG disintegrating tablet Commonly known as: ZOFRAN-ODT Take 1 tablet (4 mg total) by mouth every 8 (eight) hours as needed for nausea or vomiting.   QUEtiapine 50 MG tablet Commonly known as: SEROQUEL Take 50 mg by mouth at bedtime.        Follow-up Information     [facility] Follow up.   Specialty: Home Health Services Why: PT/OT/Aide/SW will call to schedule your first appointment. Contact information: [address] [place] [address] [phone]                Discharge Exam: Ceasar Mons Weights   05/17/23 1811 05/17/23 2346  Weight: 57.9 kg 54.8 kg   HEENT:  [place]/AT, No thrush, no icterus CV:  RRR, no rub, no S3, no S4 Lung:  CTA, no wheeze, no rhonchi Abd:  soft/+BS, NT Ext:  No edema, no lymphangitis, no synovitis, no rash   Condition at discharge: stable  The results of significant diagnostics from this hospitalization (including imaging, microbiology, ancillary and laboratory) are listed below for reference.   Imaging Studies: MR BRAIN WO CONTRAST  Result Date: 05/18/2023 CLINICAL DATA:  Neuro deficit, acute, stroke suspected EXAM: MRI HEAD WITHOUT  CONTRAST TECHNIQUE: Multiplanar, multiecho pulse sequences of the brain and surrounding structures were obtained without intravenous contrast. COMPARISON:  CT head 05/17/2023 FINDINGS: Brain: Negative for an acute infarct. No hydrocephalus. No extra-axial fluid collection. There is sequela of moderate chronic microvascular ischemic change with a slight frontal lobe predominance. No hemorrhage. Vascular: Normal flow voids. Skull and upper cervical spine: Normal marrow signal. Sinuses/Orbits: No middle ear or mastoid effusion. Paranasal sinuses are clear. Bilateral lens replacement. Orbits are otherwise unremarkable. Other: None. IMPRESSION: No acute intracranial process. Electronically Signed   By: Lorenza Cambridge M.D.   On: 05/18/2023 10:02   CT Cervical Spine Wo Contrast  Result Date: 05/17/2023 CLINICAL DATA:  Recent fall with neck pain, initial encounter EXAM: CT CERVICAL SPINE WITHOUT CONTRAST TECHNIQUE: Multidetector CT imaging of the cervical spine was performed without intravenous contrast. Multiplanar CT image reconstructions were also generated. RADIATION DOSE REDUCTION: This exam was performed according to the departmental dose-optimization program which includes automated exposure control, adjustment of the mA and/or kV according to patient size and/or use of iterative reconstruction technique. COMPARISON:  01/24/2023 FINDINGS: Alignment: Within normal limits. Skull base and vertebrae: 7 cervical  segments are well visualized. Vertebral body height is well maintained. Mild osteophytic changes are noted. Mild facet hypertrophic changes are noted as well. No acute fracture or acute facet abnormality is noted. Posterior fusion defect at C1 is seen. Soft tissues and spinal canal: Surrounding soft tissue structures demonstrate scattered calcifications within the thyroid. No discrete nodule is noted. No other soft tissue changes are noted. Upper chest: Visualized lung apices are within normal limits. Other:  None IMPRESSION: Degenerative changes of the cervical spine without acute abnormality. Electronically Signed   By: Alcide Clever M.D.   On: 05/17/2023 20:28   CT Head Wo Contrast  Result Date: 05/17/2023 CLINICAL DATA:  Fall, head trauma. EXAM: CT HEAD WITHOUT CONTRAST TECHNIQUE: Contiguous axial images were obtained from the base of the skull through the vertex without intravenous contrast. RADIATION DOSE REDUCTION: This exam was performed according to the departmental dose-optimization program which includes automated exposure control, adjustment of the mA and/or kV according to patient size and/or use of iterative reconstruction technique. COMPARISON:  Head CT 01/24/2023 FINDINGS: Brain: No evidence of acute infarction, hemorrhage, hydrocephalus, extra-axial collection or mass lesion/mass effect. There is mild diffuse atrophy. There is mild periventricular white matter hypodensity. Vascular: No hyperdense vessel or unexpected calcification. Skull: Normal. Negative for fracture or focal lesion. Sinuses/Orbits: No acute finding. Other: None. IMPRESSION: 1. No acute intracranial process. 2. Mild diffuse atrophy and mild periventricular white matter hypodensity, likely chronic microvascular disease. Electronically Signed   By: Darliss Cheney M.D.   On: 05/17/2023 20:23   DG Ankle Complete Left  Result Date: 05/17/2023 CLINICAL DATA:  Fall, left ankle pain EXAM: LEFT ANKLE COMPLETE - 3+ VIEW COMPARISON:  None Available. FINDINGS: There is no evidence of fracture, dislocation, or joint effusion. Mild degenerative spurring within the medial gutter of the ankle. Soft tissues are unremarkable. IMPRESSION: Negative. Electronically Signed   By: Duanne Guess D.O.   On: 05/17/2023 14:38   DG Hip Unilat W or Wo Pelvis 2-3 Views Left  Result Date: 05/17/2023 CLINICAL DATA:  Fall, left hip pain EXAM: DG HIP (WITH OR WITHOUT PELVIS) 2-3V LEFT COMPARISON:  01/09/2023 FINDINGS: There is no evidence of hip fracture or  dislocation. There is no evidence of significant arthropathy or other focal bone abnormality. IMPRESSION: Negative. Electronically Signed   By: Duanne Guess D.O.   On: 05/17/2023 14:37    Microbiology: Results for orders placed or performed during the hospital encounter of 04/12/20  Urine culture     Status: Abnormal   Collection Time: 04/12/20 10:29 PM   Specimen: Urine, Random  Result Value Ref Range Status   Specimen Description   Final    URINE, RANDOM Performed at [facility], [address] [place], [place] [address]    Special Requests   Final    NONE Performed at [facility], [address] [place], [place] [address]    Culture (A)  Final    60,000 COLONIES/mL DIPHTHEROIDS(CORYNEBACTERIUM SPECIES) Standardized susceptibility testing for this organism is not available. Performed at [facility], [address], [place], [place] [address]    Report Status 04/14/2020 FINAL  Final    Labs: CBC: Recent Labs  Lab 05/17/23 1325 05/17/23 1947 05/18/23 0359 05/19/23 0418  WBC 9.6 13.2* 10.7* 6.9  NEUTROABS  --  11.0*  --   --   HGB 12.9 13.3 11.7* 10.9*  HCT 36.9 39.0 35.1* 33.0*  MCV 90.7 92.4 94.4 96.2  PLT  267 284 244 221   Basic Metabolic Panel: Recent Labs  Lab 05/17/23 1325 05/17/23 1947 05/18/23 0359 05/19/23 0418  NA 138 137 139 142  K 3.2* 2.9* 3.3* 4.1  CL 110 107 112* 112*  CO2 18* 19* 20* 22  GLUCOSE 101* 117* 86 93  BUN 18 19 14 13   CREATININE 0.81 1.01* 0.81 0.75  CALCIUM 8.9 9.0 8.5* 8.5*  MG  --   --  2.1 2.1  PHOS  --   --  1.7* 5.0*   Liver Function Tests: Recent Labs  Lab 05/18/23 0359  AST 23  ALT 12  ALKPHOS 62  BILITOT 1.0  PROT 6.8  ALBUMIN 3.6   CBG: No results for input(s): "GLUCAP" in the last 168 hours.  Discharge time spent: greater than 30 minutes.  Signed: Catarina Hartshorn, MD [facility] 05/19/2023

## 2023-05-19 NOTE — Care Management Obs Status (Signed)
MEDICARE OBSERVATION STATUS NOTIFICATION   Patient Details  Name: [name] MRN: [id number] Date of Birth: 11-Nov-1950   Medicare Observation Status Notification Given:  Yes    Corey Harold 05/19/2023, 10:33 AM

## 2023-05-19 NOTE — Progress Notes (Signed)
Pt alert and oriented to person only. No c/o pain or discomfort noted. Pt slept through the night.

## 2023-05-19 NOTE — TOC Transition Note (Signed)
Transition of Care [facility]) - CM/SW Discharge Note   Patient Details  Name: [name] MRN: [id number] Date of Birth: [birth year]  Transition of Care [facility]) CM/SW Contact:  Elliot Gault, LCSW Phone Number: 05/19/2023, 12:16 PM   Clinical Narrative:     Pt medically stable for dc home today per MD. Met with pt and her sister at bedside to review dc planning and provide sister with requested information.  [facility] will follow up with sister to arrange visits. TOC updated Clifton Custard at [employer] and address.   Sister has resources for ALF, respite care, and [facility] social services for [employer] application.  No other TOC needs for dc.  Final next level of care: Home w Home Health Services Barriers to Discharge: Barriers Resolved   Patient Goals and CMS Choice CMS Medicare.gov Compare Post Acute Care list provided to:: Patient Represenative (must comment) Choice offered to / list presented to : Sibling  Discharge Placement                         Discharge Plan and Services Additional resources added to the After Visit Summary for                            HH Arranged: RN, PT, OT, Nurse's Aide, Social Work [employer] Agency: [facility] Date [facility] Agency Contacted: 05/18/23 Time HH Agency Contacted: 1314 Representative spoke with at [facility] Agency: Clifton Custard  Social Determinants of Health (SDOH) Interventions SDOH Screenings   Food Insecurity: No Food Insecurity (05/17/2023)  Housing: Low Risk  (05/17/2023)  Transportation Needs: No Transportation Needs (05/17/2023)  Utilities: Not At Risk (05/17/2023)  Alcohol Screen: Low Risk  (09/20/2022)  Depression (PHQ2-9): Low Risk  (01/18/2023)  Financial Resource Strain: Low Risk  (09/20/2022)  Physical Activity: Inactive (09/20/2022)  Social Connections: Moderately Integrated (09/20/2022)  Stress: No Stress Concern Present (09/20/2022)  Tobacco Use: Low Risk  (05/17/2023)      Readmission Risk Interventions     No data to display

## 2023-05-19 NOTE — Progress Notes (Signed)
IV removed and discharge instructions reviewed with sister.   To call office for follow up with primary md.  Transported to main entrance and sister to drive home

## 2023-05-22 ENCOUNTER — Telehealth: Payer: Self-pay

## 2023-05-22 ENCOUNTER — Other Ambulatory Visit: Payer: Self-pay | Admitting: Family Medicine

## 2023-05-22 ENCOUNTER — Ambulatory Visit: Payer: Self-pay

## 2023-05-22 DIAGNOSIS — E559 Vitamin D deficiency, unspecified: Secondary | ICD-10-CM

## 2023-05-22 NOTE — Transitions of Care (Post Inpatient/ED Visit) (Unsigned)
   05/22/2023  Name: [name] MRN: [id number] DOB: 1951-07-15  Today's TOC FU Call Status: Today's TOC FU Call Status:: Unsuccessul Call (1st Attempt) Unsuccessful Call (1st Attempt) Date: 05/22/23  Attempted to reach the patient regarding the most recent Inpatient/ED visit.  Follow Up Plan: Additional outreach attempts will be made to reach the patient to complete the Transitions of Care (Post Inpatient/ED visit) call.   Signature   Woodfin Ganja LPN [facility] Nurse Health Advisor Direct Dial [phone]

## 2023-05-22 NOTE — Chronic Care Management (AMB) (Signed)
   05/22/2023  [name] 05-03-1951 [id number]   Reason for Encounter: Patient is not currently enrolled in the CCM program. CCM enrollment status changed to Previously enrolled.   [facility]/Chronic Care Management [phone]

## 2023-05-23 NOTE — Transitions of Care (Post Inpatient/ED Visit) (Signed)
   05/23/2023  Name: [name] MRN: [id number] DOB: October 15, 1951  Today's TOC FU Call Status: Today's TOC FU Call Status:: Unsuccessful Call (2nd Attempt) Unsuccessful Call (1st Attempt) Date: 05/22/23 Unsuccessful Call (2nd Attempt) Date: 05/23/23  Attempted to reach the patient regarding the most recent Inpatient/ED visit.  Follow Up Plan: Additional outreach attempts will be made to reach the patient to complete the Transitions of Care (Post Inpatient/ED visit) call.   Signature   Woodfin Ganja LPN [facility] Nurse Health Advisor Direct Dial [phone]

## 2023-05-24 ENCOUNTER — Telehealth: Payer: Self-pay | Admitting: Family Medicine

## 2023-05-24 NOTE — Telephone Encounter (Signed)
Ok to send verbal order

## 2023-05-24 NOTE — Transitions of Care (Post Inpatient/ED Visit) (Signed)
   05/24/2023  Name: RAYANN JIAN MRN: [id number] DOB: 05/07/1951  Today's TOC FU Call Status: Today's TOC FU Call Status:: Successful TOC FU Call Completed Unsuccessful Call (1st Attempt) Date: 05/22/23 Unsuccessful Call (2nd Attempt) Date: 05/23/23 Unsuccessful Call (3rd Attempt) Date: 05/24/23 [facility] FU Call Complete Date: 05/24/23  Attempted to reach the patient regarding the most recent Inpatient/ED visit.  Follow Up Plan: No further outreach attempts will be made at this time. We have been unable to contact the patient.  Signature   Woodfin Ganja LPN [facility] Nurse Health Advisor Direct Dial [phone]

## 2023-05-24 NOTE — Telephone Encounter (Signed)
Rx denial sent via escribe. 

## 2023-05-24 NOTE — Telephone Encounter (Signed)
Will check her vitamin D at the visit in August, will decide then if she needs the refill, for now ok to refuse

## 2023-05-24 NOTE — Telephone Encounter (Signed)
Cara rn with [facility] is calling and would like VO SN 1x4 and also ST eval

## 2023-05-24 NOTE — Telephone Encounter (Signed)
Left a detailed message with the approval for orders on Cara's voicemail as below.

## 2023-06-12 ENCOUNTER — Other Ambulatory Visit: Payer: Self-pay | Admitting: Family Medicine

## 2023-06-12 DIAGNOSIS — F32A Depression, unspecified: Secondary | ICD-10-CM

## 2023-06-12 DIAGNOSIS — U071 COVID-19: Secondary | ICD-10-CM | POA: Diagnosis not present

## 2023-06-12 DIAGNOSIS — Z6822 Body mass index (BMI) 22.0-22.9, adult: Secondary | ICD-10-CM | POA: Diagnosis not present

## 2023-06-14 ENCOUNTER — Ambulatory Visit: Payer: Medicare PPO | Admitting: Podiatry

## 2023-06-20 ENCOUNTER — Inpatient Hospital Stay: Payer: Medicare PPO | Admitting: Family Medicine

## 2023-07-16 DIAGNOSIS — L03113 Cellulitis of right upper limb: Secondary | ICD-10-CM | POA: Diagnosis not present

## 2023-07-16 DIAGNOSIS — Z6820 Body mass index (BMI) 20.0-20.9, adult: Secondary | ICD-10-CM | POA: Diagnosis not present

## 2023-07-27 ENCOUNTER — Inpatient Hospital Stay: Payer: Medicare PPO | Admitting: Family Medicine

## 2023-08-03 IMAGING — MG MM DIGITAL SCREENING BILAT W/ TOMO AND CAD
8 series · 9 of 24 positions shown · non-contrast
Comparison: None.

CLINICAL DATA: Screening.

EXAM:
DIGITAL SCREENING BILATERAL MAMMOGRAM WITH TOMOSYNTHESIS AND CAD
TECHNIQUE: Bilateral screening digital craniocaudal and mediolateral oblique
mammograms were obtained. Bilateral screening digital breast
tomosynthesis was performed. The images were evaluated with
computer-aided detection.

[R MLO synth-2D]
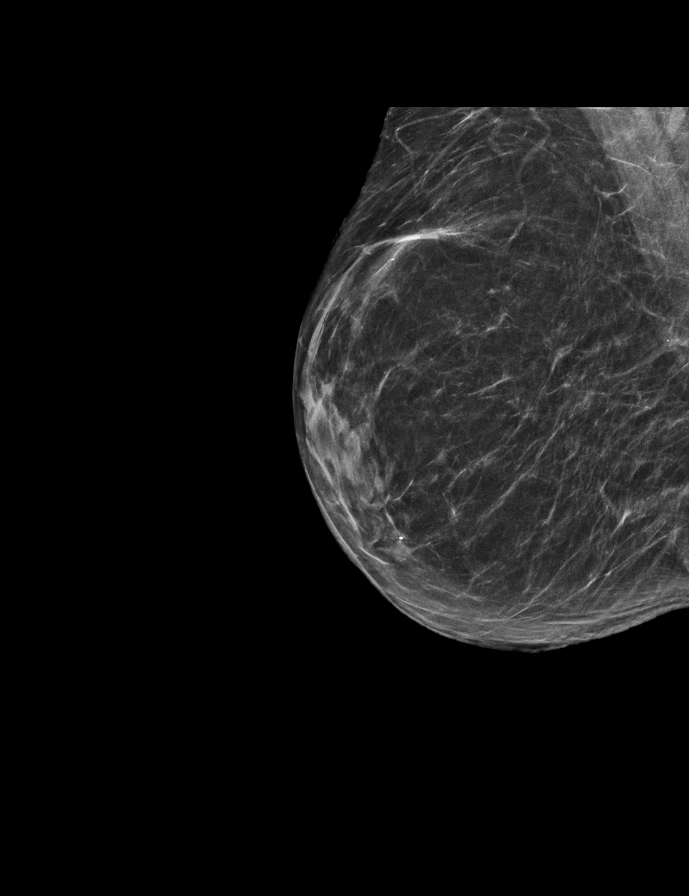

[L CC synth-2D]
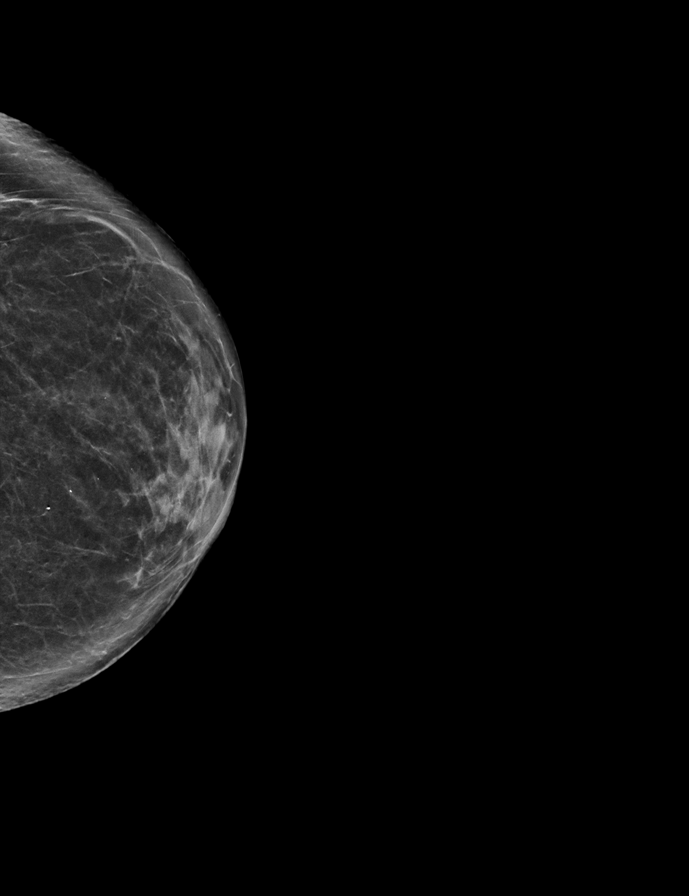

[L MLO synth-2D]
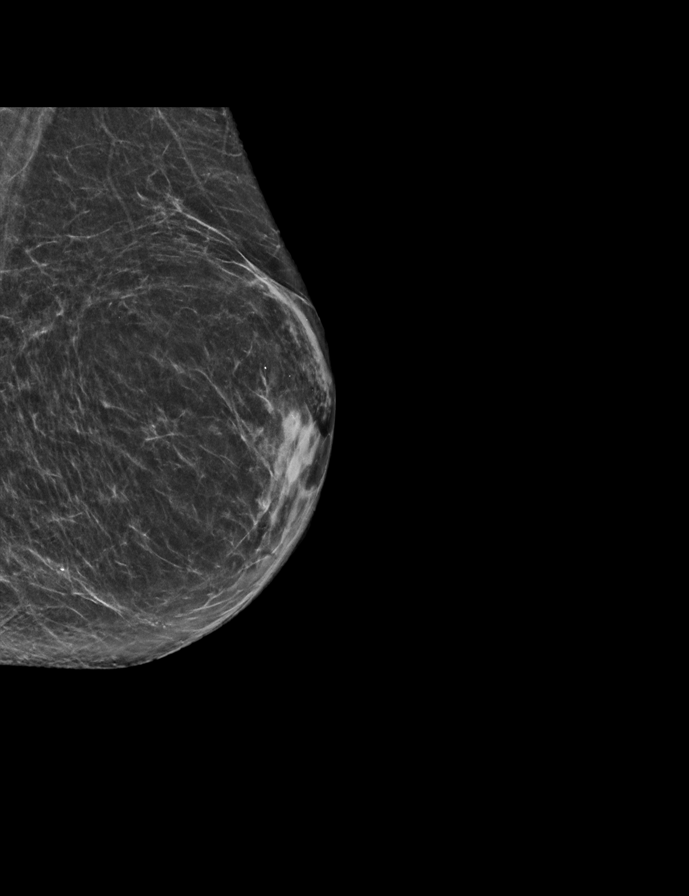

[R CC synth-2D]
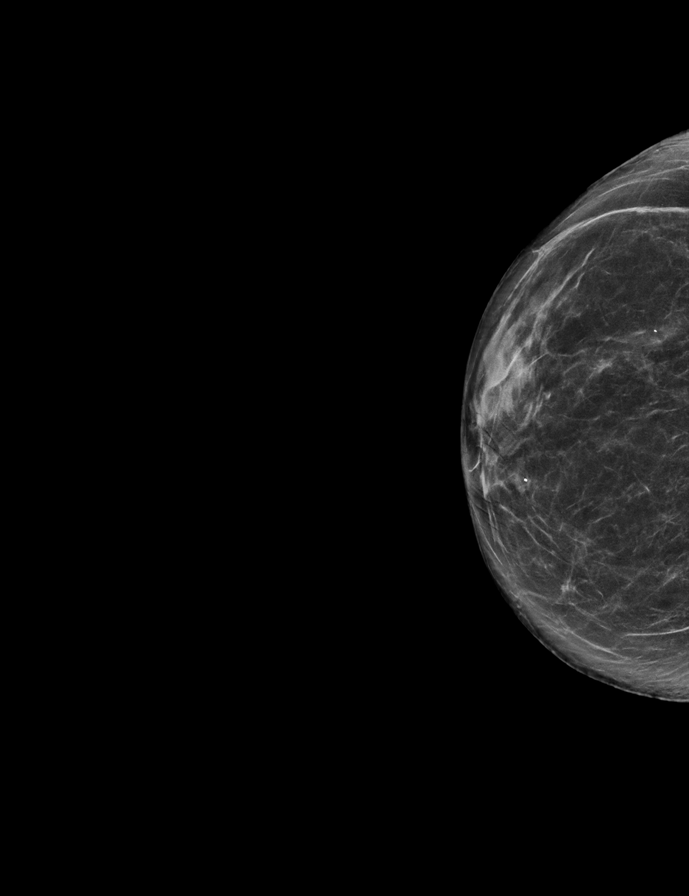

[L CC tomo · 2 of 60 frames shown]
[frame 20/60]
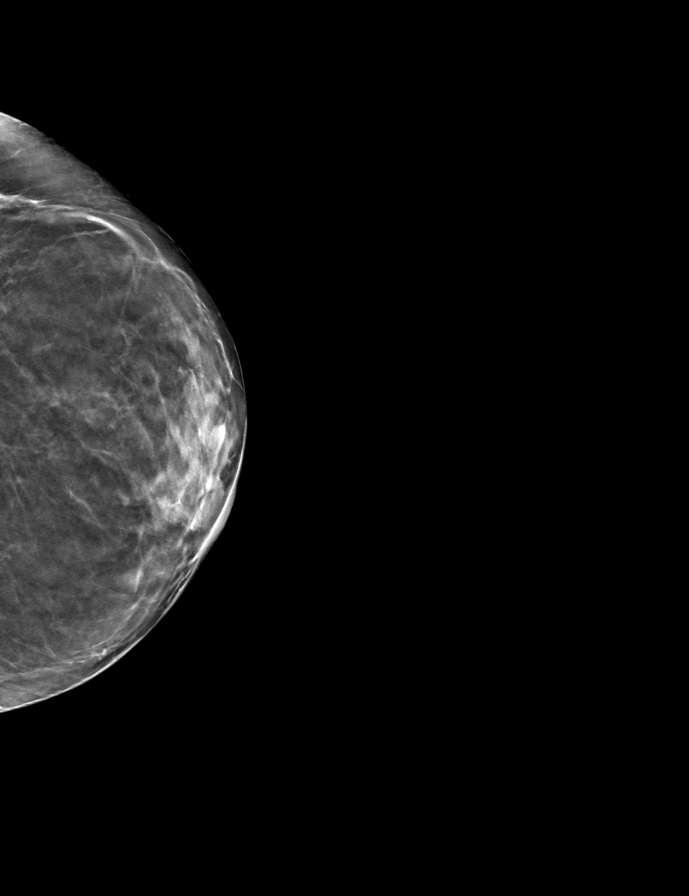
[frame 31/60]
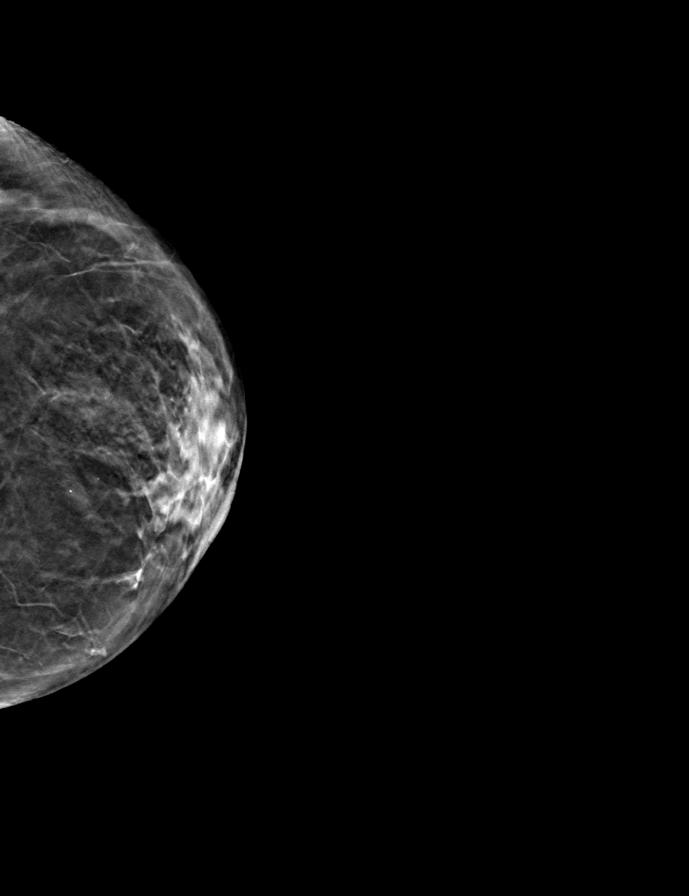

[R MLO tomo · tomo slice 32/63.0]
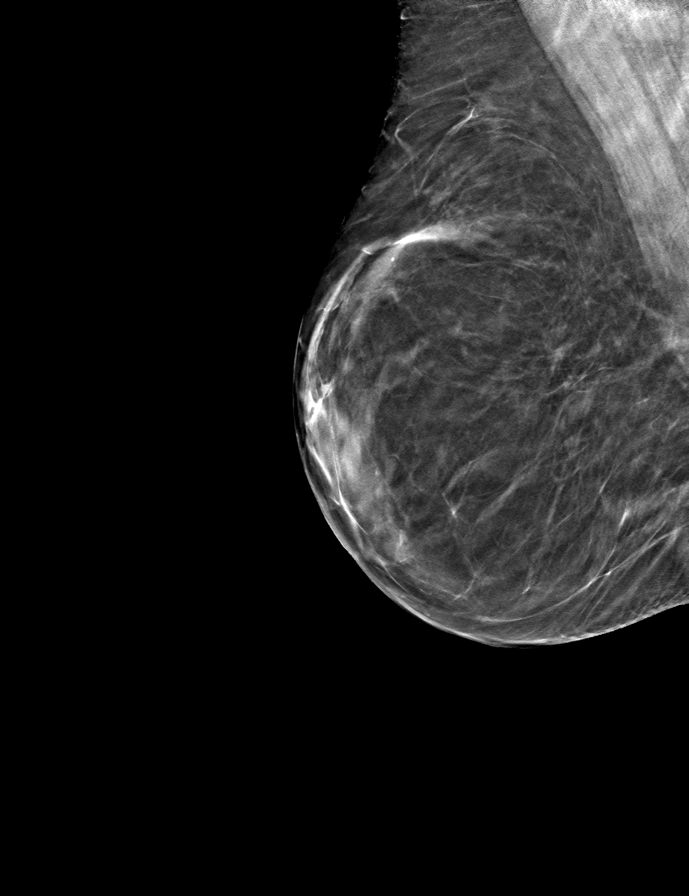

[R CC tomo · tomo slice 32/63.0]
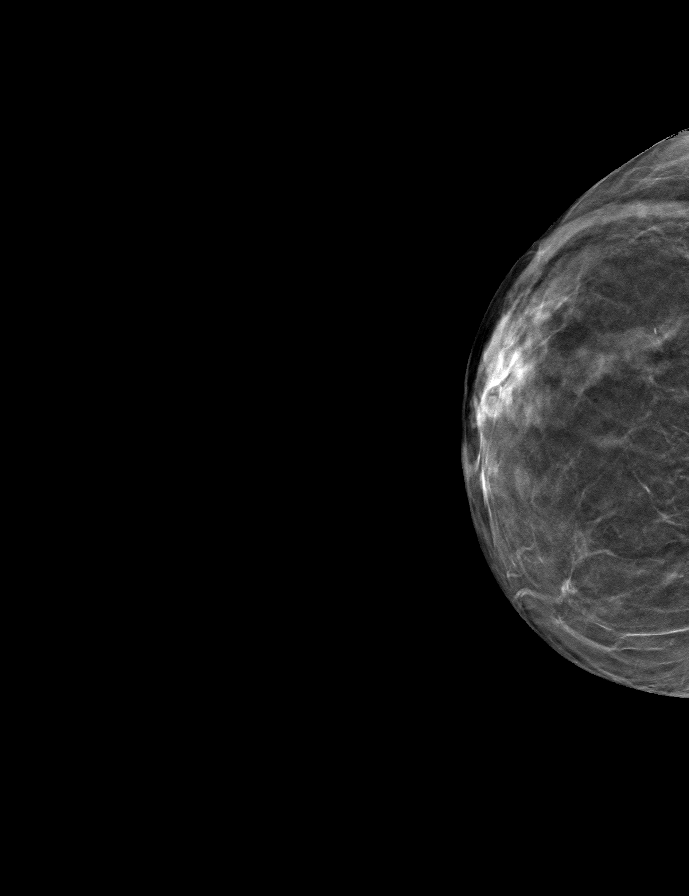

[L MLO tomo · tomo slice 31/60.0]
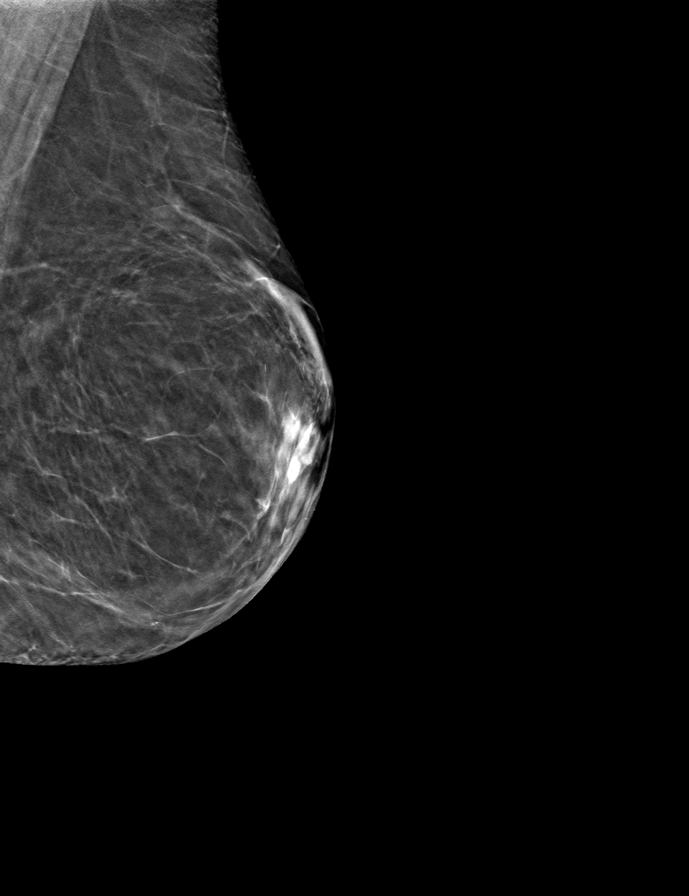

[9 of 24 positions shown; findings below may reference images not displayed]

ACR Breast Density Category b: There are scattered areas of
fibroglandular density.
FINDINGS: There are no findings suspicious for malignancy.
IMPRESSION: No mammographic evidence of malignancy. A result letter of this
screening mammogram will be mailed directly to the patient.

RECOMMENDATION:
Screening mammogram in one year. (Code:XG-X-X7B)

BI-RADS CATEGORY  1: Negative.

## 2023-08-15 ENCOUNTER — Ambulatory Visit: Payer: Medicare PPO | Admitting: Physician Assistant

## 2023-08-15 ENCOUNTER — Encounter: Payer: Self-pay | Admitting: Physician Assistant

## 2023-08-15 VITALS — BP 116/42 | HR 68 | Ht 63.0 in | Wt 117.0 lb

## 2023-08-15 DIAGNOSIS — G301 Alzheimer's disease with late onset: Secondary | ICD-10-CM | POA: Diagnosis not present

## 2023-08-15 DIAGNOSIS — F02818 Dementia in other diseases classified elsewhere, unspecified severity, with other behavioral disturbance: Secondary | ICD-10-CM | POA: Diagnosis not present

## 2023-08-15 NOTE — Progress Notes (Signed)
Assessment/Plan:   Dementia likely due to Alzheimer's Disease with behavioral disturbance   [name] is a very pleasant 72 y.o. 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 Patient is currently on memantine 10 mg twice daily and donepezil 10 mg daily.  He is also on sertraline 50 mg daily and Seroquel for mood control as per PCP.  Significant cognitive decline is noted.  Family favors to continue the use of them understanding that are not designed to improve or reverse any memory loss, and is may no longer be therapeutic.  She needs 24/7 monitoring.   Follow up in 6  months. Continue memantine 10 mg twice daily and donepezil 10 mg daily, side effects discussed  Continue 24/7 monitoring with home health nursing.  Continue B12 supplements.  Recommend  caregiver support group for her sister and niece. Recommend good control of her cardiovascular risk factors Continue to control mood as per PCP.  She is on Seroquel 50 mg nightly and Zoloft 50 mg daily. Referral to [facility] (Palliative Care)     Subjective:    This patient is accompanied in the office by her sister who supplements the history.  Previous records as well as any outside records available were reviewed prior to todays visit. Patient was last seen on 02/14/2023    Any changes in memory since last visit? " Worse". She is very forgetful within seconds and has decreased conversation. She recognizes her sister, but has difficulty with one niece. Trouble forming words "sometimes are not coming"-sister says    repeats oneself?   Denies Disoriented when walking into a room? Once in a while she forgets where the bathroom is    Leaving objects in unusual places?  May misplace things    Wandering behavior?  Endorsed, she starts to unlock the door attempting to walk out.  Any personality changes since last visit?  Yes,  getting more agitated than in April, especially when sundown comes. At that point she rubs her hands on her thighs repeatedly  Any worsening depression?:  Denies.   Hallucinations or paranoia?  She talks to "someone"  Seizures? Denies.    Any sleep changes?  Denies vivid dreams, REM behavior or sleepwalking   Sleep apnea?   Denies.   Any hygiene concerns? "She may fight it".  Independent of bathing and dressing?  Endorsed  Does the patient needs help with medications? Sister is in charge   Who is in charge of the finances? Niece  is in charge     Any changes in appetite?  denies     Patient have trouble swallowing? Denies.   Does the patient cook? No Any headaches?   denies   Chronic back pain  denies   Ambulates with difficulty? Denies.    Recent falls or head injuries?  She has been seen at the ED after a fall at home on 05/17/2023 requiring hospitalization until 05/19/2023, believed to be due to UTI,  and then rehab.  She is not using her L hand as before, she favors the R .  Unilateral weakness, numbness or tingling? denies   Any tremors?  Denies   Any anosmia?  Denies   Any incontinence of urine?  Endorsed, wears diapers.  Any bowel dysfunction? She may have an issue when allowing someone to help "accidents".  Patient lives with her sister. She goes 3 days a  week to [place].     Does the patient drive? No longer drives     Initial visit 12/30/2019 this is a pleasant 72 year old right-handed woman with a history of hypertension, hyperlipidemia, depression, IBS, Alzheimer's dementia with behavioral disturbance, presenting to establish care. She was previously seen by Dr. Everlena Cooper, records were reviewed. She started having memory issues in 2013 after a concussion. In 2018, she got lost driving. 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 Memory difficulties continued to worsen in 2020, she would forget conversations quickly and forget familiar places. Repeat  Neuropsychological evaluation in March 2021 indicated significant decline in several areas, including measures of attention/working memory, verbal fluency, and visuospatial functioning and executive functioning. Etiology most likely Alzheimer's disease. Concern was raised that she has lost the ability to independently make medical decisions and manage medications/finances. Guardianship may be appropriate if caregiving is met with resistance. She was in the ER in June 2021 when family could not get in touch with her and found her wandering outside, she had locked herself outside and did not know why she was outside or how long she had been there. She appeared to be hallucinating, talking about a giraffe in the backyard and a snake in the house. She was treated for a UTI. She has been living with her sister since then. She thinks her memory is good, "might not be perfect." [name] took over finances after the incident last June. She manages her own pillbox and denies missing medications. She has not been driving since June. She does not cook. She has occasional word-finding difficulties, some days are better than others. She is independent with dressing and bathing. No further hallucinations. She gets frustrated with herself and can be a little short with other, no paranoia. [name] thinks she ran out of Citalopram and feels that she looks sad. She wants to go home and [name] feels they are keeping her hostage. She is on Donepezil 10mg  daily and Memantine 10mg  daily without side effects.   She denies any headaches, dizziness, diplopia, dysarthria, dysphagia, neck/back pain, focal numbness/tingling/weakness, bowel/bladder dysfunction. No anosmia, tremors, no falls. Sleep is good. Her father had dementia. She denies any significant head injuries, no alcohol use. 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.  CT head without contrast done 03/2020 showed diffuse atrophy, chronic microvascular  disease.   PREVIOUS MEDICATIONS:   CURRENT MEDICATIONS:  Outpatient Encounter Medications as of 08/15/2023  Medication Sig   cyanocobalamin (VITAMIN B12) 1000 MCG tablet Take 1 tablet (1,000 mcg total) by mouth daily. For 30 days then reduce to 1/2 tablet daily for 30 days.   donepezil (ARICEPT) 10 MG tablet TAKE 1 TABLET BY MOUTH EVERYDAY AT BEDTIME (Patient taking differently: Take 10 mg by mouth at bedtime.)   memantine (NAMENDA) 10 MG tablet TAKE 1 TABLET BY MOUTH TWICE A DAY   ondansetron (ZOFRAN-ODT) 4 MG disintegrating tablet Take 1 tablet (4 mg total) by mouth every 8 (eight) hours as needed for nausea or vomiting.   QUEtiapine (SEROQUEL) 50 MG tablet Take 50 mg by mouth at bedtime.   sertraline (ZOLOFT) 50 MG tablet TAKE 1 TABLET BY MOUTH EVERY DAY   vitamin B-12 (VITAMIN B12) 500 MCG tablet Take 1 tablet (500 mcg total) by mouth daily.   No facility-administered encounter medications on file as of 08/15/2023.       01/22/2020    2:00 PM 08/24/2018    8:48 AM  MMSE - Mini  Mental State Exam  Orientation to time 3 5  Orientation to Place 4 5  Registration 3 3  Attention/ Calculation 5 5  Recall 0 0  Language- name 2 objects 2 2  Language- repeat 0 1  Language- follow 3 step command 2 3  Language- read & follow direction 1 1  Write a sentence 1 1  Copy design 1 1  Total score 22 27      11/17/2020   10:00 AM 10/15/2019    9:00 AM 03/30/2017    1:00 PM  Montreal Cognitive Assessment   Visuospatial/ Executive (0/5) 1  2  Naming (0/3) 3  2  Attention: Read list of digits (0/2) 0 1 1  Attention: Read list of letters (0/1) 1 1 1   Attention: Serial 7 subtraction starting at 100 (0/3) 1 1 2   Language: Repeat phrase (0/2) 0 1 1  Language : Fluency (0/1) 0 1 1  Abstraction (0/2) 2 1 2   Delayed Recall (0/5) 0 0 1  Orientation (0/6) 3 4 6   Total 11  19  Adjusted Score (based on education) 11  20    Objective:     PHYSICAL EXAMINATION:    VITALS:   Vitals:    08/15/23 1111  BP: (!) 116/42  Pulse: 68  SpO2: 96%  Weight: 117 lb (53.1 kg)  Height: 5\' 3"  (1.6 m)    GEN:  The patient appears stated age and is in NAD. HEENT:  Normocephalic, atraumatic.   Neurological examination:  General: NAD, well-groomed, appears stated age. Orientation: The patient is alert. Oriented to person, not to place and date Cranial nerves: There is good facial symmetry.The speech is not fluent but clear. No aphasia or dysarthria. Fund of knowledge is reduced. Recent and remote memory are impaired. Attention and concentration are reduced.  Able to name objects and repeat phrases.  Hearing is intact to conversational tone.   Sensation: Sensation is intact to light touch throughout Motor: Strength is at least antigravity x4. DTR's 2/4 in UE/LE     Movement examination: Tone: There is normal tone in the UE/LE Abnormal movements:  no tremor.  No myoclonus.  No asterixis.   Coordination:  There is decremation with RAM's. L arm neglect is noted  Abnormal  finger to nose  Gait and Station: The patient has difficulty arising out of a deep-seated chair without the use of the hands. The patient's stride length is shorter than prior  Gait is cautious and narrow. Stride is short. No ataxia    Thank you for allowing [place] the opportunity to participate in the care of this nice patient. Please do not hesitate to contact us for any questions or concerns.   Total time spent on today's visit was 40  minutes dedicated to this patient today, preparing to see patient, examining the patient, ordering tests and/or medications and counseling the patient, documenting clinical information in the EHR or other health record, independently interpreting results and communicating results to the patient/family, discussing treatment and goals, answering patient's questions and coordinating care.  Cc:  Karie Georges, MD  Marlowe Kays 08/15/2023 12:04 PM

## 2023-08-15 NOTE — Patient Instructions (Addendum)
It was a pleasure to see you today at our office.   Recommendations:   Follow up April 28  at 11:30 am  Continue memantine, twice daily  Continue donepezil 10 mg daily Continue to control mood  sertraline  Seroquel as per PCP Continue 24/7 monitor, recommend home health nursing and adult day program . Replenish B12 Referral to [facility]  Visit "Dementia success path"  Whom to call:  Memory  decline, memory medications: Call our office [phone]   For psychiatric meds, mood meds: Please have your primary care physician manage these medications.   Counseling regarding caregiver distress, including caregiver depression, anxiety and issues regarding community resources, adult day care programs, adult living facilities, or memory care questions:      For assessment of decision of mental capacity and competency:  Call Dr. Erick Blinks, geriatric psychiatrist at [phone]  For guidance in geriatric dementia issues please call [facility] [phone]    If you have any severe symptoms of a stroke, or other severe issues such as confusion,severe chills or fever, etc call 911 or go to the ER as you may need to be evaluated further     RECOMMENDATIONS FOR ALL PATIENTS WITH MEMORY PROBLEMS: 1. Continue to exercise (Recommend 30 minutes of walking everyday, or 3 hours every week) 2. Increase social interactions - continue going to [place] and enjoy social gatherings with friends and family 3. Eat healthy, avoid fried foods and eat more fruits and vegetables 4. Maintain adequate blood pressure, blood sugar, and blood cholesterol level. Reducing the risk of stroke and cardiovascular disease also helps promoting better memory. 5. Avoid stressful situations. Live a simple life and avoid aggravations. Organize your time and prepare for the next day in anticipation. 6. Sleep well, avoid any interruptions of sleep and avoid any distractions in the bedroom that may interfere  with adequate sleep quality 7. Avoid sugar, avoid sweets as there is a strong link between excessive sugar intake, diabetes, and cognitive impairment We discussed the Mediterranean diet, which has been shown to help patients reduce the risk of progressive memory disorders and reduces cardiovascular risk. This includes eating fish, eat fruits and green leafy vegetables, nuts like almonds and hazelnuts, walnuts, and also use olive oil. Avoid fast foods and fried foods as much as possible. Avoid sweets and sugar as sugar use has been linked to worsening of memory function.  There is always a concern of gradual progression of memory problems. If this is the case, then we may need to adjust level of care according to patient needs. Support, both to the patient and caregiver, should then be put into place.     FALL PRECAUTIONS: Be cautious when walking. Scan the area for obstacles that may increase the risk of trips and falls. When getting up in the mornings, sit up at the edge of the bed for a few minutes before getting out of bed. Consider elevating the bed at the head end to avoid drop of blood pressure when getting up. Walk always in a well-lit room (use night lights in the walls). Avoid area rugs or power cords from appliances in the middle of the walkways. Use a walker or a cane if necessary and consider physical therapy for balance exercise. Get your eyesight checked regularly.  FINANCIAL OVERSIGHT: Supervision, especially oversight when making financial decisions or transactions is also recommended.  HOME SAFETY: Consider the safety of the kitchen when operating appliances like stoves, microwave oven, and blender. Consider having  supervision and share cooking responsibilities until no longer able to participate in those. Accidents with firearms and other hazards in the house should be identified and addressed as well.   ABILITY TO BE LEFT ALONE: If patient is unable to contact 911 operator, consider  using Life[facility], or when the need is there, arrange for someone to stay with patients. Smoking is a fire hazard, consider supervision or cessation. Risk of wandering should be assessed by caregiver and if detected at any point, supervision and safe proof recommendations should be instituted.  MEDICATION SUPERVISION: Inability to self-administer medication needs to be constantly addressed. Implement a mechanism to ensure safe administration of the medications.   DRIVING: Regarding driving, in patients with progressive memory problems, driving will be impaired. We advise to have someone else do the driving if trouble finding directions or if minor accidents are reported. Independent driving assessment is available to determine safety of driving.   If you are interested in the driving assessment, you can contact the following:  [facility] [employer] [facility] in [place] [phone]  [facility] [phone]  [facility] [phone] or [phone]    Mediterranean Diet A Mediterranean diet refers to food and lifestyle choices that are based on the traditions of countries located on the [employer]. This way of eating has been shown to help prevent certain conditions and improve outcomes for people who have chronic diseases, like kidney disease and heart disease. What are tips for following this plan? Lifestyle  Cook and eat meals together with your family, when possible. Drink enough fluid to keep your urine clear or pale yellow. Be physically active every day. This includes: Aerobic exercise like running or swimming. Leisure activities like gardening, walking, or housework. Get 7-8 hours of sleep each night. If recommended by your health care provider, drink red wine in moderation. This means 1 glass a day for nonpregnant women and 2 glasses a day for men. A glass of wine equals 5 oz (150 mL). Reading food labels  Check the  serving size of packaged foods. For foods such as rice and pasta, the serving size refers to the amount of cooked product, not dry. Check the total fat in packaged foods. Avoid foods that have saturated fat or trans fats. Check the ingredients list for added sugars, such as corn syrup. Shopping  At the grocery store, buy most of your food from the areas near the walls of the store. This includes: Fresh fruits and vegetables (produce). Grains, beans, nuts, and seeds. Some of these may be available in unpackaged forms or large amounts (in bulk). Fresh seafood. Poultry and eggs. Low-fat dairy products. Buy whole ingredients instead of prepackaged foods. Buy fresh fruits and vegetables in-season from local farmers markets. Buy frozen fruits and vegetables in resealable bags. If you do not have access to quality fresh seafood, buy precooked frozen shrimp or canned fish, such as tuna, salmon, or sardines. Buy small amounts of raw or cooked vegetables, salads, or olives from the deli or salad bar at your store. Stock your pantry so you always have certain foods on hand, such as olive oil, canned tuna, canned tomatoes, rice, pasta, and beans. Cooking  Cook foods with extra-virgin olive oil instead of using butter or other vegetable oils. Have meat as a side dish, and have vegetables or grains as your main dish. This means having meat in small portions or adding small amounts of meat to foods like pasta or stew. Use beans  or vegetables instead of meat in common dishes like chili or lasagna. Experiment with different cooking methods. Try roasting or broiling vegetables instead of steaming or sauteing them. Add frozen vegetables to soups, stews, pasta, or rice. Add nuts or seeds for added healthy fat at each meal. You can add these to yogurt, salads, or vegetable dishes. Marinate fish or vegetables using olive oil, lemon juice, garlic, and fresh herbs. Meal planning  Plan to eat 1 vegetarian meal one  day each week. Try to work up to 2 vegetarian meals, if possible. Eat seafood 2 or more times a week. Have healthy snacks readily available, such as: Vegetable sticks with hummus. Greek yogurt. Fruit and nut trail mix. Eat balanced meals throughout the week. This includes: Fruit: 2-3 servings a day Vegetables: 4-5 servings a day Low-fat dairy: 2 servings a day Fish, poultry, or lean meat: 1 serving a day Beans and legumes: 2 or more servings a week Nuts and seeds: 1-2 servings a day Whole grains: 6-8 servings a day Extra-virgin olive oil: 3-4 servings a day Limit red meat and sweets to only a few servings a month What are my food choices? Mediterranean diet Recommended Grains: Whole-grain pasta. Brown rice. Bulgar wheat. Polenta. Couscous. Whole-wheat bread. Orpah Cobb. Vegetables: Artichokes. Beets. Broccoli. Cabbage. Carrots. Eggplant. Green beans. Chard. Kale. Spinach. Onions. Leeks. Peas. Squash. Tomatoes. Peppers. Radishes. Fruits: Apples. Apricots. Avocado. Berries. Bananas. Cherries. Dates. Figs. Grapes. Lemons. Melon. Oranges. Peaches. Plums. Pomegranate. Meats and other protein foods: Beans. Almonds. Sunflower seeds. Pine nuts. Peanuts. Cod. Salmon. Scallops. Shrimp. Tuna. Tilapia. Clams. Oysters. Eggs. Dairy: Low-fat milk. Cheese. Greek yogurt. Beverages: Water. Red wine. Herbal tea. Fats and oils: Extra virgin olive oil. Avocado oil. Grape seed oil. Sweets and desserts: [place] yogurt with honey. Baked apples. Poached pears. Trail mix. Seasoning and other foods: Basil. Cilantro. Coriander. Cumin. Mint. Parsley. Sage. Rosemary. Tarragon. Garlic. Oregano. Thyme. Pepper. Balsalmic vinegar. Tahini. Hummus. Tomato sauce. Olives. Mushrooms. Limit these Grains: Prepackaged pasta or rice dishes. Prepackaged cereal with added sugar. Vegetables: Deep fried potatoes (french fries). Fruits: Fruit canned in syrup. Meats and other protein foods: Beef. Pork. Lamb. Poultry with skin.  Hot dogs. Tomasa Blase. Dairy: Ice cream. Sour cream. Whole milk. Beverages: Juice. Sugar-sweetened soft drinks. Beer. Liquor and spirits. Fats and oils: Butter. Canola oil. Vegetable oil. Beef fat (tallow). Lard. Sweets and desserts: Cookies. Cakes. Pies. Candy. Seasoning and other foods: Mayonnaise. Premade sauces and marinades. The items listed may not be a complete list. Talk with your dietitian about what dietary choices are right for you. Summary The Mediterranean diet includes both food and lifestyle choices. Eat a variety of fresh fruits and vegetables, beans, nuts, seeds, and whole grains. Limit the amount of red meat and sweets that you eat. Talk with your health care provider about whether it is safe for you to drink red wine in moderation. This means 1 glass a day for nonpregnant women and 2 glasses a day for men. A glass of wine equals 5 oz (150 mL). This information is not intended to replace advice given to you by your health care provider. Make sure you discuss any questions you have with your health care provider. Document Released: 06/02/2016 Document Revised: 07/05/2016 Document Reviewed: 06/02/2016 Elsevier Interactive Patient Education  2017 [employer].

## 2023-08-30 ENCOUNTER — Emergency Department (HOSPITAL_COMMUNITY)
Admission: EM | Admit: 2023-08-30 | Discharge: 2023-08-30 | Disposition: A | Discharge status: Home / Self Care | Payer: Medicare PPO

## 2023-08-30 ENCOUNTER — Other Ambulatory Visit: Payer: Self-pay

## 2023-08-30 ENCOUNTER — Emergency Department (HOSPITAL_COMMUNITY): Payer: Medicare PPO

## 2023-08-30 DIAGNOSIS — S0990XA Unspecified injury of head, initial encounter: Secondary | ICD-10-CM | POA: Diagnosis present

## 2023-08-30 DIAGNOSIS — W01198A Fall on same level from slipping, tripping and stumbling with subsequent striking against other object, initial encounter: Secondary | ICD-10-CM | POA: Diagnosis not present

## 2023-08-30 DIAGNOSIS — Y9301 Activity, walking, marching and hiking: Secondary | ICD-10-CM | POA: Diagnosis not present

## 2023-08-30 DIAGNOSIS — F0390 Unspecified dementia without behavioral disturbance: Secondary | ICD-10-CM | POA: Insufficient documentation

## 2023-08-30 DIAGNOSIS — G9389 Other specified disorders of brain: Secondary | ICD-10-CM | POA: Diagnosis not present

## 2023-08-30 DIAGNOSIS — N390 Urinary tract infection, site not specified: Secondary | ICD-10-CM | POA: Insufficient documentation

## 2023-08-30 DIAGNOSIS — W19XXXA Unspecified fall, initial encounter: Secondary | ICD-10-CM

## 2023-08-30 DIAGNOSIS — S0081XA Abrasion of other part of head, initial encounter: Secondary | ICD-10-CM | POA: Diagnosis not present

## 2023-08-30 LAB — COMPREHENSIVE METABOLIC PANEL
ALT: 11 U/L (ref 0–44)
AST: 17 U/L (ref 15–41)
Albumin: 3.9 g/dL (ref 3.5–5.0)
Alkaline Phosphatase: 68 U/L (ref 38–126)
Anion gap: 8 (ref 5–15)
BUN: 20 mg/dL (ref 8–23)
CO2: 23 mmol/L (ref 22–32)
Calcium: 9.4 mg/dL (ref 8.9–10.3)
Chloride: 107 mmol/L (ref 98–111)
Creatinine, Ser: 0.8 mg/dL (ref 0.44–1.00)
GFR, Estimated: >60 mL/min (ref >60)
Glucose, Bld: 98 mg/dL (ref 70–99)
Potassium: 4.1 mmol/L (ref 3.5–5.1)
Sodium: 138 mmol/L (ref 135–145)
Total Bilirubin: 0.4 mg/dL (ref <1.2)
Total Protein: 7.3 g/dL (ref 6.5–8.1)

## 2023-08-30 LAB — URINALYSIS, ROUTINE W REFLEX MICROSCOPIC
Bilirubin Urine: NEGATIVE
Glucose, UA: NEGATIVE mg/dL
Ketones, ur: NEGATIVE mg/dL
Leukocytes,Ua: NEGATIVE
Nitrite: NEGATIVE
Protein, ur: NEGATIVE mg/dL
Specific Gravity, Urine: 1.006 (ref 1.005–1.030)
pH: 8 (ref 5.0–8.0)

## 2023-08-30 LAB — CBC WITH DIFFERENTIAL/PLATELET
Abs Immature Granulocytes: 0.01 10*3/uL (ref 0.00–0.07)
Basophils Absolute: 0.1 10*3/uL (ref 0.0–0.1)
Basophils Relative: 1 %
Eosinophils Absolute: 0.1 10*3/uL (ref 0.0–0.5)
Eosinophils Relative: 1 %
HCT: 36.3 % (ref 36.0–46.0)
Hemoglobin: 11.9 g/dL — ABNORMAL LOW (ref 12.0–15.0)
Immature Granulocytes: 0 %
Lymphocytes Relative: 25 %
Lymphs Abs: 1.9 10*3/uL (ref 0.7–4.0)
MCH: 30.8 pg (ref 26.0–34.0)
MCHC: 32.8 g/dL (ref 30.0–36.0)
MCV: 94 fL (ref 80.0–100.0)
Monocytes Absolute: 0.6 10*3/uL (ref 0.1–1.0)
Monocytes Relative: 7 %
Neutro Abs: 5 10*3/uL (ref 1.7–7.7)
Neutrophils Relative %: 66 %
Platelets: 292 10*3/uL (ref 150–400)
RBC: 3.86 MIL/uL — ABNORMAL LOW (ref 3.87–5.11)
RDW: 12.8 % (ref 11.5–15.5)
WBC: 7.7 10*3/uL (ref 4.0–10.5)
nRBC: 0 % (ref 0.0–0.2)

## 2023-08-30 MED ORDER — CEPHALEXIN 500 MG PO CAPS: 500.0000 mg | ORAL_CAPSULE | Freq: Two times a day (BID) | ORAL | 0 refills | Status: AC

## 2023-08-30 NOTE — ED Triage Notes (Signed)
Pt has dementia and was outside walking and fell face first on concrete. No LOC and denies use of blood thinners. Pt has a scuffed up area on her forehead. Pt with a large hematoma to her forehead as well.

## 2023-08-30 NOTE — ED Triage Notes (Signed)
Pt sister also concerned that she has a UTI. Pt also has a skin tear to her left hand.

## 2023-08-30 NOTE — ED Provider Notes (Signed)
[place] EMERGENCY DEPARTMENT AT [facility] Provider Note   CSN: [id number] Arrival date & time: 08/30/23  1429     History  Chief Complaint  Patient presents with   Marletta Lor    [name] is a 72 y.o. female.  72 year old female with dementia presenting emergency department after fall.  Struck head.  No reported LOC.  No blood thinners.  Sister states she is nearly at baseline, acting as if she may have UTI.   Fall       Home Medications Prior to Admission medications   Medication Sig Start Date End Date Taking? Authorizing Provider  cyanocobalamin (VITAMIN B12) 1000 MCG tablet Take 1 tablet (1,000 mcg total) by mouth daily. For 30 days then reduce to 1/2 tablet daily for 30 days. 03/06/23   Karie Georges, MD  donepezil (ARICEPT) 10 MG tablet TAKE 1 TABLET BY MOUTH EVERYDAY AT BEDTIME Patient taking differently: Take 10 mg by mouth at bedtime. 12/19/22   Karie Georges, MD  memantine (NAMENDA) 10 MG tablet TAKE 1 TABLET BY MOUTH TWICE A DAY 12/19/22   Karie Georges, MD  ondansetron (ZOFRAN-ODT) 4 MG disintegrating tablet Take 1 tablet (4 mg total) by mouth every 8 (eight) hours as needed for nausea or vomiting. 01/13/23   Karie Georges, MD  QUEtiapine (SEROQUEL) 50 MG tablet Take 50 mg by mouth at bedtime. 02/15/23   [provider]  sertraline (ZOLOFT) 50 MG tablet TAKE 1 TABLET BY MOUTH EVERY DAY 06/14/23   Karie Georges, MD  vitamin B-12 (VITAMIN B12) 500 MCG tablet Take 1 tablet (500 mcg total) by mouth daily. 05/19/23   Catarina Hartshorn, MD      Allergies    Erythromycin base and Other    Review of Systems   Review of Systems  Physical Exam Updated Vital Signs BP (!) 141/73 (BP Location: Left Arm)   Pulse 67   Temp 98.8 F (37.1 C) (Oral)   Resp 18   Ht 5\' 3"  (1.6 m)   Wt 53.1 kg   SpO2 99%   BMI 20.73 kg/m  Physical Exam Vitals and nursing note reviewed.  Constitutional:      General: She is not in acute distress.     Appearance: She is not toxic-appearing.  HENT:     Nose: Nose normal.     Mouth/Throat:     Mouth: Mucous membranes are moist.  Eyes:     Conjunctiva/sclera: Conjunctivae normal.  Cardiovascular:     Rate and Rhythm: Normal rate and regular rhythm.  Abdominal:     General: Abdomen is flat. There is no distension.     Tenderness: There is no abdominal tenderness. There is no guarding or rebound.  Skin:    General: Skin is warm.  Neurological:     Mental Status: Mental status is at baseline.  Psychiatric:        Mood and Affect: Mood normal.        Behavior: Behavior normal.     ED Results / Procedures / Treatments   Labs (all labs ordered are listed, but only abnormal results are displayed) Labs Reviewed  CBC WITH DIFFERENTIAL/PLATELET - Abnormal; Notable for the following components:      Result Value   RBC 3.86 (*)    Hemoglobin 11.9 (*)    All other components within normal limits  URINALYSIS, ROUTINE W REFLEX MICROSCOPIC - Abnormal; Notable for the following components:   Color, Urine STRAW (*)  Hgb urine dipstick LARGE (*)    Bacteria, UA RARE (*)    All other components within normal limits  COMPREHENSIVE METABOLIC PANEL    EKG None  Radiology CT Head Wo Contrast  Result Date: 08/30/2023 CLINICAL DATA:  Fall, facial trauma EXAM: CT HEAD WITHOUT CONTRAST TECHNIQUE: Contiguous axial images were obtained from the base of the skull through the vertex without intravenous contrast. RADIATION DOSE REDUCTION: This exam was performed according to the departmental dose-optimization program which includes automated exposure control, adjustment of the mA and/or kV according to patient size and/or use of iterative reconstruction technique. COMPARISON:  05/17/2023 FINDINGS: Brain: No evidence of acute infarction, hemorrhage, mass, mass effect, or midline shift. No hydrocephalus or extra-axial fluid collection. Advanced cerebral atrophy for age, with ex vacuo dilatation of the  ventricles. Periventricular white matter changes, likely the sequela of chronic small vessel ischemic disease. Vascular: No hyperdense vessel. Skull: Negative for fracture or focal lesion. Sinuses/Orbits: No acute finding. Other: The mastoid air cells are well aerated. IMPRESSION: No acute intracranial process. Electronically Signed   By: Wiliam Ke M.D.   On: 08/30/2023 18:59    Procedures Procedures    Medications Ordered in ED Medications - No data to display  ED Course/ Medical Decision Making/ A&P Clinical Course as of 08/30/23 2058  Wed Aug 30, 2023  1905 CT Head Wo Contrast IMPRESSION: No acute intracranial process.   [TY]    Clinical Course User Index [TY] Coral Spikes, DO                                 Medical Decision Making 72 year old female present emergency department for evaluation after fall.  Abrasion to forehead.  Vital signs reassuring.  Essentially at baseline per family member who reports history of dementia.  CT head negative for acute pathology.  No other bony tenderness on exam.  Some evidence of UTI on UA.  Will discharge with antibiotics.  Comprehensive panel with no metabolic derangements.  No leukocytosis to suggest systemic infection.  Ambulated at her baseline. Patient stable for discharge at this time.  Follow-up primary doctor.  Return precautions given.  She does live with her sister who feels comfortable taking her home.  Amount and/or Complexity of Data Reviewed Labs: ordered. Radiology: ordered. Decision-making details documented in ED Course.         Final Clinical Impression(s) / ED Diagnoses Final diagnoses:  None    Rx / DC Orders ED Discharge Orders     None         Coral Spikes, DO 08/30/23 2058

## 2023-08-30 NOTE — ED Notes (Signed)
Pt ambulated in hallway at baseline with minimal assistance. Family reports pt is at baseline with her ambulation.

## 2023-08-30 NOTE — Discharge Instructions (Signed)
Given urinary tract infection.  Take antibiotics we have prescribed in the full course.  Please follow-up with primary doctor.  Return immediately develop sudden onset headache, vision changes, facial droop, unilateral weakness, chest pain, shortness of breath, abdominal pain with nausea vomiting or any new or worsening symptoms that are concerning to you.

## 2023-08-31 ENCOUNTER — Encounter: Payer: Self-pay | Admitting: Internal Medicine

## 2023-09-04 ENCOUNTER — Encounter: Payer: Self-pay | Admitting: Family Medicine

## 2023-09-04 ENCOUNTER — Ambulatory Visit: Payer: Medicare PPO | Admitting: Family Medicine

## 2023-09-04 VITALS — BP 110/70

## 2023-09-04 DIAGNOSIS — I10 Essential (primary) hypertension: Secondary | ICD-10-CM | POA: Diagnosis not present

## 2023-09-04 DIAGNOSIS — F32A Depression, unspecified: Secondary | ICD-10-CM | POA: Diagnosis not present

## 2023-09-04 DIAGNOSIS — Z1211 Encounter for screening for malignant neoplasm of colon: Secondary | ICD-10-CM | POA: Diagnosis not present

## 2023-09-04 DIAGNOSIS — R296 Repeated falls: Secondary | ICD-10-CM | POA: Diagnosis not present

## 2023-09-04 DIAGNOSIS — Z23 Encounter for immunization: Secondary | ICD-10-CM

## 2023-09-04 DIAGNOSIS — F02818 Dementia in other diseases classified elsewhere, unspecified severity, with other behavioral disturbance: Secondary | ICD-10-CM

## 2023-09-04 DIAGNOSIS — G301 Alzheimer's disease with late onset: Secondary | ICD-10-CM | POA: Diagnosis not present

## 2023-09-04 MED ORDER — DONEPEZIL HCL 10 MG PO TABS
ORAL_TABLET | ORAL | 1 refills | Status: DC
Start: 2023-09-04 — End: 2024-01-02

## 2023-09-04 MED ORDER — SERTRALINE HCL 100 MG PO TABS
100.0000 mg | ORAL_TABLET | Freq: Every day | ORAL | 1 refills | Status: DC
Start: 2023-09-04 — End: 2024-01-02

## 2023-09-04 MED ORDER — BREXPIPRAZOLE 2 MG PO TABS
2.0000 mg | ORAL_TABLET | Freq: Every day | ORAL | 1 refills | Status: DC
Start: 2023-09-04 — End: 2024-01-02

## 2023-09-04 MED ORDER — BREXPIPRAZOLE 1 MG PO TABS
1.0000 mg | ORAL_TABLET | Freq: Every day | ORAL | 0 refills | Status: DC
Start: 2023-09-04 — End: 2024-01-02

## 2023-09-04 MED ORDER — MEMANTINE HCL 10 MG PO TABS
10.0000 mg | ORAL_TABLET | Freq: Two times a day (BID) | ORAL | 1 refills | Status: DC
Start: 2023-09-04 — End: 2024-01-02

## 2023-09-04 MED ORDER — BREXPIPRAZOLE 0.5 MG PO TABS
0.5000 mg | ORAL_TABLET | Freq: Every day | ORAL | 0 refills | Status: DC
Start: 2023-09-04 — End: 2024-01-02

## 2023-09-04 NOTE — Assessment & Plan Note (Signed)
Worsening symptoms, will increase sertraline to 100 mg daily. New rx sent

## 2023-09-04 NOTE — Progress Notes (Signed)
Established Patient Office Visit  Subjective   Patient ID: [name], female    DOB: 11/01/1950  Age: 72 y.o. MRN: [id number]  Chief Complaint  Patient presents with   Medical Management of Chronic Issues    Pt is here with her sister who is her caregiver. Sister reports that she fell at the senior center, was sent to the ER and it was discovered that she had a UTI. She is currently on antibiotics- cephalexin. I reviewed her blood work and urinalysis. Sister states that she has seen a deterioration of her mental status. States that she tries to ask her very simple questions but it seems like she cannot comprehend what she is saying. States that the agitation is somewhat worse at night. She reports that it seems like her depression symptoms are also worse.     Current Outpatient Medications  Medication Instructions   Brexpiprazole (REXULTI) 0.5 mg, Oral, Daily   brexpiprazole (REXULTI) 1 mg, Oral, Daily   brexpiprazole (REXULTI) 2 mg, Oral, Daily   cephALEXin (KEFLEX) 500 mg, Oral, 2 times daily   cyanocobalamin (VITAMIN B12) 1,000 mcg, Oral, Daily, For 30 days then reduce to 1/2 tablet daily for 30 days.   cyanocobalamin (VITAMIN B12) 500 mcg, Oral, Daily   donepezil (ARICEPT) 10 MG tablet TAKE 1 TABLET BY MOUTH EVERYDAY AT BEDTIME   memantine (NAMENDA) 10 mg, Oral, 2 times daily   ondansetron (ZOFRAN-ODT) 4 mg, Oral, Every 8 hours PRN   QUEtiapine (SEROQUEL) 50 mg, Oral, Daily at bedtime   sertraline (ZOLOFT) 100 mg, Oral, Daily    Patient Active Problem List   Diagnosis Date Noted   Frequent falls 09/04/2023   UTI (urinary tract infection) 05/18/2023   Hypokalemia 05/18/2023   Leukocytosis 05/18/2023   Gait instability 05/18/2023   Generalized weakness 05/18/2023   Late onset Alzheimer's disease with behavioral disturbance (HCC) 08/05/2021   Memory problem 06/12/2019   Alopecia areata 05/30/2014   Tendinopathy of left biceps tendon 10/04/2013   Trigger finger,  acquired 10/04/2013   Left shoulder pain 09/03/2013   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   Onychomycosis of toenail 01/22/2013   IBS (irritable bowel syndrome) 09/09/2011   ABDOMINAL PAIN, LOWER 02/02/2009   Essential hypertension 09/26/2008   B12 deficiency 11/13/2007   Dyslipidemia 11/13/2007   LOW BACK PAIN 11/13/2007   Chronic depression 07/14/2007   Osteoarthritis 07/14/2007      Review of Systems  All other systems reviewed and are negative.     Objective:     BP 110/70 (BP Location: Right Arm, Patient Position: Sitting, Cuff Size: Normal) Comment: unable to perform due to lack of comprehension--jaf   Physical Exam Vitals reviewed.  Constitutional:      Appearance: Normal appearance. She is well-groomed and normal weight.  Eyes:     Conjunctiva/sclera: Conjunctivae normal.  Neck:     Thyroid: No thyromegaly.  Cardiovascular:     Rate and Rhythm: Normal rate and regular rhythm.     Pulses: Normal pulses.     Heart sounds: S1 normal and S2 normal.  Pulmonary:     Effort: Pulmonary effort is normal.     Breath sounds: Normal breath sounds and air entry.  Abdominal:     General: Bowel sounds are normal.  Musculoskeletal:     Right lower leg: No edema.     Left lower leg: No edema.  Skin:    Findings: Ecchymosis (extensive healing bruises on the forehead) and laceration (on forehead) present.  Neurological:     Mental Status: She is alert. Mental status is at baseline. She is disoriented.     Gait: Gait is intact.  Psychiatric:        Mood and Affect: Mood and affect normal.        Speech: She is noncommunicative.        Behavior: Behavior is agitated.        Cognition and Memory: Memory is impaired.      No results found for any visits on 09/04/23.    The 10-year ASCVD risk score (Arnett DK, et al., 2019) is: 8.7%    Assessment & Plan:  Colon cancer screening -     Cologuard  Late onset Alzheimer's disease with behavioral disturbance  (HCC) Assessment & Plan: Pt goes three times a week to senior caregiving, sister reports her mentation has worsened since the last visit. We discussed switching the seroquel at night to rexulti to help with the agitation, sister is agreeable. Counseled patient on placement issues.   Orders: -     Donepezil HCl; TAKE 1 TABLET BY MOUTH EVERYDAY AT BEDTIME  Dispense: 90 tablet; Refill: 1 -     Memantine HCl; Take 1 tablet (10 mg total) by mouth 2 (two) times daily.  Dispense: 180 tablet; Refill: 1 -     Brexpiprazole; Take 1 tablet (0.5 mg total) by mouth daily.  Dispense: 14 tablet; Refill: 0 -     Brexpiprazole; Take 1 tablet (1 mg total) by mouth daily.  Dispense: 14 tablet; Refill: 0 -     Brexpiprazole; Take 1 tablet (2 mg total) by mouth daily.  Dispense: 90 tablet; Refill: 1 -     Amb Referral to Palliative Care -     Ambulatory referral to Home Health  Chronic depression Assessment & Plan: Worsening symptoms, will increase sertraline to 100 mg daily. New rx sent  Orders: -     Sertraline HCl; Take 1 tablet (100 mg total) by mouth daily.  Dispense: 90 tablet; Refill: 1  Frequent falls Assessment & Plan: I have placed a home health referral and also a palliative care referral so they can counsel the caregiver on end of life care.   Orders: -     Ambulatory referral to Home Health  Essential hypertension Assessment & Plan: BP in office today is normal, pt not on any medication for this  Orders: -     Comprehensive metabolic panel; Future -     Lipid panel; Future  Immunization due -     Flu Vaccine Trivalent High Dose (Fluad)     Return in about 6 months (around 03/03/2024) for follow up on dementia.    Karie Georges, MD

## 2023-09-04 NOTE — Assessment & Plan Note (Signed)
BP in office today is normal, pt not on any medication for this

## 2023-09-04 NOTE — Assessment & Plan Note (Signed)
Pt goes three times a week to senior caregiving, sister reports her mentation has worsened since the last visit. We discussed switching the seroquel at night to rexulti to help with the agitation, sister is agreeable. Counseled patient on placement issues.

## 2023-09-04 NOTE — Assessment & Plan Note (Signed)
I have placed a home health referral and also a palliative care referral so they can counsel the caregiver on end of life care.

## 2023-09-05 ENCOUNTER — Telehealth: Payer: Self-pay | Admitting: Family Medicine

## 2023-09-05 NOTE — Telephone Encounter (Signed)
Spoke with Lanora Manis and informed her of the approval for orders as below.

## 2023-09-05 NOTE — Telephone Encounter (Signed)
Ok to add VO

## 2023-09-05 NOTE — Telephone Encounter (Signed)
Rec'd referral for PT. Currently do not have PT in that area, requesting an VO for nursing to be added and then can add PT in about a month

## 2023-09-09 DIAGNOSIS — F0283 Dementia in other diseases classified elsewhere, unspecified severity, with mood disturbance: Secondary | ICD-10-CM | POA: Diagnosis not present

## 2023-09-09 DIAGNOSIS — R410 Disorientation, unspecified: Secondary | ICD-10-CM | POA: Diagnosis not present

## 2023-09-09 DIAGNOSIS — G301 Alzheimer's disease with late onset: Secondary | ICD-10-CM | POA: Diagnosis not present

## 2023-09-09 DIAGNOSIS — F0284 Dementia in other diseases classified elsewhere, unspecified severity, with anxiety: Secondary | ICD-10-CM | POA: Diagnosis not present

## 2023-09-09 DIAGNOSIS — I10 Essential (primary) hypertension: Secondary | ICD-10-CM | POA: Diagnosis not present

## 2023-09-09 DIAGNOSIS — F32A Depression, unspecified: Secondary | ICD-10-CM | POA: Diagnosis not present

## 2023-09-09 DIAGNOSIS — F02811 Dementia in other diseases classified elsewhere, unspecified severity, with agitation: Secondary | ICD-10-CM | POA: Diagnosis not present

## 2023-09-09 DIAGNOSIS — N390 Urinary tract infection, site not specified: Secondary | ICD-10-CM | POA: Diagnosis not present

## 2023-09-09 DIAGNOSIS — F02818 Dementia in other diseases classified elsewhere, unspecified severity, with other behavioral disturbance: Secondary | ICD-10-CM | POA: Diagnosis not present

## 2023-09-13 ENCOUNTER — Telehealth: Payer: Self-pay

## 2023-09-13 NOTE — Telephone Encounter (Signed)
Transition Care Management Follow-up Telephone Call Date of discharge and from where: 08/30/2023 [facility] How have you been since you were released from the hospital? Per patient's caregiver/sister Rickey Barbara on [facility] stated patient is feeling much better. Any questions or concerns? No  Items Reviewed: Did the pt receive and understand the discharge instructions provided? Yes  Medications obtained and verified? Yes  Other?  Per request for caregiver sent [id number] referral to [employer]. Any new allergies since your discharge? No  Dietary orders reviewed? Yes Do you have support at home? Yes   Follow up appointments reviewed:  PCP Hospital f/u appt confirmed? Yes  Scheduled to see Vinetta Bergamo. Casimiro Needle, MD on 09/04/2023 @ [facility] [place]. Specialist Hospital f/u appt confirmed? No  Scheduled to see  on  @ . Are transportation arrangements needed? No  If their condition worsens, is the pt aware to call PCP or go to the Emergency Dept.? Yes Was the patient provided with contact information for the PCP's office or ED? Yes Was to pt encouraged to call back with questions or concerns? Yes   Tsutomu Barfoot [facility]  [facility], [facility] Guide Direct Dial: [phone]  Website: [email or web address]

## 2023-09-14 DIAGNOSIS — F02811 Dementia in other diseases classified elsewhere, unspecified severity, with agitation: Secondary | ICD-10-CM | POA: Diagnosis not present

## 2023-09-14 DIAGNOSIS — R410 Disorientation, unspecified: Secondary | ICD-10-CM | POA: Diagnosis not present

## 2023-09-14 DIAGNOSIS — F02818 Dementia in other diseases classified elsewhere, unspecified severity, with other behavioral disturbance: Secondary | ICD-10-CM | POA: Diagnosis not present

## 2023-09-14 DIAGNOSIS — G301 Alzheimer's disease with late onset: Secondary | ICD-10-CM | POA: Diagnosis not present

## 2023-09-14 DIAGNOSIS — F0284 Dementia in other diseases classified elsewhere, unspecified severity, with anxiety: Secondary | ICD-10-CM | POA: Diagnosis not present

## 2023-09-14 DIAGNOSIS — F32A Depression, unspecified: Secondary | ICD-10-CM | POA: Diagnosis not present

## 2023-09-14 DIAGNOSIS — F0283 Dementia in other diseases classified elsewhere, unspecified severity, with mood disturbance: Secondary | ICD-10-CM | POA: Diagnosis not present

## 2023-09-14 DIAGNOSIS — N390 Urinary tract infection, site not specified: Secondary | ICD-10-CM | POA: Diagnosis not present

## 2023-09-14 DIAGNOSIS — I10 Essential (primary) hypertension: Secondary | ICD-10-CM | POA: Diagnosis not present

## 2023-09-18 ENCOUNTER — Other Ambulatory Visit: Payer: Self-pay | Admitting: Family Medicine

## 2023-09-18 ENCOUNTER — Telehealth: Payer: Self-pay | Admitting: Family Medicine

## 2023-09-18 DIAGNOSIS — G301 Alzheimer's disease with late onset: Secondary | ICD-10-CM

## 2023-09-18 DIAGNOSIS — F02818 Dementia in other diseases classified elsewhere, unspecified severity, with other behavioral disturbance: Secondary | ICD-10-CM

## 2023-09-18 DIAGNOSIS — R350 Frequency of micturition: Secondary | ICD-10-CM

## 2023-09-18 NOTE — Telephone Encounter (Signed)
[name] is calling and would like callback to discuss palliative care. [name] had our phone number block its unblock now

## 2023-09-18 NOTE — Telephone Encounter (Signed)
No answer at the niece's cell number.  I called Ms Thomasena [name] (the patient's sister) and informed her a visit is needed for evaluation.  Appt was scheduled for 11/26.

## 2023-09-18 NOTE — Telephone Encounter (Signed)
[name] call and stated she want to know can she get a order for a uti and want you to gave her a call back also want to talk about palliative care.[name]'s # is [phone].

## 2023-09-19 ENCOUNTER — Ambulatory Visit: Payer: Medicare PPO | Admitting: Family Medicine

## 2023-09-19 DIAGNOSIS — N390 Urinary tract infection, site not specified: Secondary | ICD-10-CM | POA: Diagnosis not present

## 2023-09-19 NOTE — Telephone Encounter (Signed)
Spoke with [name] for more information in regards to Palliative Care and she stated the initial order was sent for help from [facility], even though the patient's home address is in [place] [facility], she is living in [place] with her sister.  Stated she was told a new order is needed.  She stated the home health nurse came to the patients home recently and asked if she could have an order to collect a urine at home, since she had a UTI before, wears undergarments and is not aware when she has to urinate.  She was advised I spoke with the patient's sister yesterday, scheduled an appt with Dr Caryl Never TODAY for evaluation and the appt was cancelled.  Message sent to PCP.

## 2023-09-20 NOTE — Addendum Note (Signed)
Addended by: Johnella Moloney on: 09/20/2023 09:54 AM   Modules accepted: Orders

## 2023-09-20 NOTE — Telephone Encounter (Signed)
Ok to send order.

## 2023-09-20 NOTE — Telephone Encounter (Signed)
New order entered for palliative care as below.  Left a voicemail message on the nurses line for [facility] (ph#[phone]) as to the process needed for urine collection at home.

## 2023-09-25 ENCOUNTER — Telehealth: Payer: Self-pay

## 2023-09-25 NOTE — Telephone Encounter (Signed)
Unsuccessful attempts x2 to reach patient on preferred number listed in notes for scheduled AWV. Unable to leave message.

## 2023-09-25 NOTE — Telephone Encounter (Signed)
Left a voicemail message on the nurses line at [facility] to have someone return my call.

## 2023-09-26 ENCOUNTER — Ambulatory Visit: Payer: Medicare PPO | Admitting: Podiatry

## 2023-09-26 DIAGNOSIS — N390 Urinary tract infection, site not specified: Secondary | ICD-10-CM | POA: Diagnosis not present

## 2023-09-26 DIAGNOSIS — F0283 Dementia in other diseases classified elsewhere, unspecified severity, with mood disturbance: Secondary | ICD-10-CM | POA: Diagnosis not present

## 2023-09-26 DIAGNOSIS — R410 Disorientation, unspecified: Secondary | ICD-10-CM | POA: Diagnosis not present

## 2023-09-26 DIAGNOSIS — I10 Essential (primary) hypertension: Secondary | ICD-10-CM | POA: Diagnosis not present

## 2023-09-26 DIAGNOSIS — F0284 Dementia in other diseases classified elsewhere, unspecified severity, with anxiety: Secondary | ICD-10-CM | POA: Diagnosis not present

## 2023-09-26 DIAGNOSIS — F32A Depression, unspecified: Secondary | ICD-10-CM | POA: Diagnosis not present

## 2023-09-26 DIAGNOSIS — F02811 Dementia in other diseases classified elsewhere, unspecified severity, with agitation: Secondary | ICD-10-CM | POA: Diagnosis not present

## 2023-09-26 DIAGNOSIS — F02818 Dementia in other diseases classified elsewhere, unspecified severity, with other behavioral disturbance: Secondary | ICD-10-CM | POA: Diagnosis not present

## 2023-09-26 DIAGNOSIS — G301 Alzheimer's disease with late onset: Secondary | ICD-10-CM | POA: Diagnosis not present

## 2023-09-28 DIAGNOSIS — F0284 Dementia in other diseases classified elsewhere, unspecified severity, with anxiety: Secondary | ICD-10-CM | POA: Diagnosis not present

## 2023-09-28 DIAGNOSIS — R410 Disorientation, unspecified: Secondary | ICD-10-CM | POA: Diagnosis not present

## 2023-09-28 DIAGNOSIS — F02811 Dementia in other diseases classified elsewhere, unspecified severity, with agitation: Secondary | ICD-10-CM | POA: Diagnosis not present

## 2023-09-28 DIAGNOSIS — F32A Depression, unspecified: Secondary | ICD-10-CM | POA: Diagnosis not present

## 2023-09-28 DIAGNOSIS — N390 Urinary tract infection, site not specified: Secondary | ICD-10-CM | POA: Diagnosis not present

## 2023-09-28 DIAGNOSIS — F02818 Dementia in other diseases classified elsewhere, unspecified severity, with other behavioral disturbance: Secondary | ICD-10-CM | POA: Diagnosis not present

## 2023-09-28 DIAGNOSIS — I10 Essential (primary) hypertension: Secondary | ICD-10-CM | POA: Diagnosis not present

## 2023-09-28 DIAGNOSIS — G301 Alzheimer's disease with late onset: Secondary | ICD-10-CM | POA: Diagnosis not present

## 2023-09-28 DIAGNOSIS — F0283 Dementia in other diseases classified elsewhere, unspecified severity, with mood disturbance: Secondary | ICD-10-CM | POA: Diagnosis not present

## 2023-09-28 NOTE — Telephone Encounter (Signed)
Vicky called back from [facility] and stated to fax the order to [phone] which will be sent to a portal they manage.  An order was completed for a urinalysis and faxed to the number above.  Left a detailed message with this information on Ms Collin's (patient's sisters) voicemail.

## 2023-09-28 NOTE — Addendum Note (Signed)
Addended by: Johnella Moloney on: 09/28/2023 08:48 AM   Modules accepted: Orders

## 2023-10-02 ENCOUNTER — Telehealth: Payer: Self-pay | Admitting: Pharmacist

## 2023-10-02 NOTE — Telephone Encounter (Signed)
Pharmacy Patient Advocate Encounter   Received notification from Physician's Office that prior authorization for Rexulti 0.5MG  tablets is required/requested.   Insurance verification completed.   The patient is insured through [employer] .   Per test claim: PA required; PA submitted to above mentioned insurance via CoverMyMeds Key/confirmation #/EOC [id number] Status is pending

## 2023-10-03 ENCOUNTER — Other Ambulatory Visit (HOSPITAL_COMMUNITY): Payer: Self-pay

## 2023-10-03 NOTE — Telephone Encounter (Signed)
Pharmacy Patient Advocate Encounter  Received notification from [facility] that Prior Authorization for Rexulti 0.5mg  tablets has been APPROVED from 10/01/2024 to 10/23/2024   PA #/Case ID/Reference #: [id number]

## 2023-10-03 NOTE — Telephone Encounter (Signed)
Left a detailed message on the voicemail at [facility] with the approval information as below.

## 2023-10-10 DIAGNOSIS — R410 Disorientation, unspecified: Secondary | ICD-10-CM | POA: Diagnosis not present

## 2023-10-10 DIAGNOSIS — F0283 Dementia in other diseases classified elsewhere, unspecified severity, with mood disturbance: Secondary | ICD-10-CM | POA: Diagnosis not present

## 2023-10-10 DIAGNOSIS — F02818 Dementia in other diseases classified elsewhere, unspecified severity, with other behavioral disturbance: Secondary | ICD-10-CM | POA: Diagnosis not present

## 2023-10-10 DIAGNOSIS — I10 Essential (primary) hypertension: Secondary | ICD-10-CM | POA: Diagnosis not present

## 2023-10-10 DIAGNOSIS — G301 Alzheimer's disease with late onset: Secondary | ICD-10-CM | POA: Diagnosis not present

## 2023-10-10 DIAGNOSIS — F0284 Dementia in other diseases classified elsewhere, unspecified severity, with anxiety: Secondary | ICD-10-CM | POA: Diagnosis not present

## 2023-10-10 DIAGNOSIS — F02811 Dementia in other diseases classified elsewhere, unspecified severity, with agitation: Secondary | ICD-10-CM | POA: Diagnosis not present

## 2023-10-10 DIAGNOSIS — F32A Depression, unspecified: Secondary | ICD-10-CM | POA: Diagnosis not present

## 2023-10-10 DIAGNOSIS — N390 Urinary tract infection, site not specified: Secondary | ICD-10-CM | POA: Diagnosis not present

## 2023-10-12 ENCOUNTER — Telehealth: Payer: Self-pay | Admitting: *Deleted

## 2023-10-12 NOTE — Telephone Encounter (Signed)
Copied from CRM [id number]. Topic: General - Other >> Oct 12, 2023 10:09 AM Theodis Sato wrote: Reason for CRM: Wilkie Aye from [facility] palliative care is calling to inform Dr. Casimiro Needle that refferal was received for PT for palliative care and the the care team will now follow her with her services along side the clinic.

## 2023-10-12 NOTE — Telephone Encounter (Signed)
Noted, ok to close task

## 2023-12-23 DEATH — deceased

## 2023-12-25 ENCOUNTER — Telehealth: Payer: Self-pay | Admitting: Physician Assistant

## 2023-12-25 NOTE — Telephone Encounter (Signed)
 Caller stated patient has passed away. Passed was on 2024/01/06

## 2023-12-29 ENCOUNTER — Telehealth: Payer: Self-pay | Admitting: Family Medicine

## 2023-12-29 NOTE — Telephone Encounter (Signed)
 Copied from CRM [id number]. Topic: General - Deceased Patient >> 01-10-24  3:37 PM Adaysia C wrote: Name of caller: Rickey Barbara  Date of death: 12-24-2023   Name of funeral home: [employer]   Phone number of funeral home: [phone]  Provider that needs to sign form: Micheal, Vinetta Bergamo, MD  Timeline for signing: ASAP

## 2024-01-01 NOTE — Telephone Encounter (Signed)
 Also changed the patient's status in demographics.

## 2024-01-01 NOTE — Telephone Encounter (Signed)
 Ok to close

## 2024-01-01 NOTE — Telephone Encounter (Signed)
 I have not received any notifications from the system and I cannot find her death certificate in the system. Please have them send me the case ID number so that I can find it in the system.

## 2024-01-01 NOTE — Telephone Encounter (Signed)
 Per Elease Hashimoto at [facility] this has already been processed.  Message sent to PCP.

## 2024-02-19 ENCOUNTER — Ambulatory Visit: Payer: Medicare PPO | Admitting: Physician Assistant

## 2024-03-01 ENCOUNTER — Other Ambulatory Visit: Payer: Medicare PPO

## 2024-03-04 ENCOUNTER — Ambulatory Visit: Payer: Medicare PPO | Admitting: Family Medicine

## 2024-04-03 ENCOUNTER — Other Ambulatory Visit: Payer: Self-pay | Admitting: Internal Medicine
